# Patient Record
Sex: Male | Born: 1995
Health system: Southern US, Community
[De-identification: ages and names within clinical notes are randomized; demographics above are authoritative.]

## PROBLEM LIST (undated history)

## (undated) ENCOUNTER — Emergency Department (HOSPITAL_COMMUNITY): Admission: EM | Payer: Self-pay | Source: Home / Self Care

## (undated) DIAGNOSIS — E538 Deficiency of other specified B group vitamins: Secondary | ICD-10-CM

## (undated) DIAGNOSIS — F319 Bipolar disorder, unspecified: Secondary | ICD-10-CM

---
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
## 2014-03-30 ENCOUNTER — Emergency Department (HOSPITAL_COMMUNITY)
Admission: EM | Admit: 2014-03-30 | Discharge: 2014-03-30 | Disposition: A | Discharge status: Home / Self Care | Payer: Self-pay | Attending: Emergency Medicine | Admitting: Emergency Medicine

## 2014-03-30 ENCOUNTER — Encounter (HOSPITAL_COMMUNITY): Payer: Self-pay | Admitting: Emergency Medicine

## 2014-03-30 DIAGNOSIS — Z720 Tobacco use: Secondary | ICD-10-CM | POA: Insufficient documentation

## 2014-03-30 DIAGNOSIS — N509 Disorder of male genital organs, unspecified: Secondary | ICD-10-CM | POA: Insufficient documentation

## 2014-03-30 DIAGNOSIS — N5089 Other specified disorders of the male genital organs: Secondary | ICD-10-CM

## 2014-03-30 NOTE — ED Notes (Signed)
Pt sts sores in genital area x 1 week

## 2014-03-30 NOTE — ED Provider Notes (Signed)
CSN: [id number]     Arrival date & time 03/30/14  1636 History  This chart was scribed for non-physician practitioner, Trixie DredgeEmily Kimoni Pickerill, PA-C, working with Loren Raceravid Yelverton, MD, by Bronson CurbJacqueline Melvin, ED Scribe. This patient was seen in room TR10C/TR10C and the patient's care was started at 5:52 PM.    Chief Complaint  Patient presents with  . Exposure to STD    The history is provided by the patient. No language interpreter was used.     HPI Comments: Jonathan [name] is a 18 y.o. male, with no significant medical history, who presents to the Emergency Department complaining of a possible STD. Patient noticed an itching and draining sore in the genital area for the past week. Patient is sexually active and states that he has had unprotected sex a "couple of times". There is associated testicular pain that began 3-4 days ago but has since resolved. He denies penile discharge, penile pain, abdominal pain, testicular swelling, or urinary symptoms.    History reviewed. No pertinent past medical history. History reviewed. No pertinent past surgical history. History reviewed. No pertinent family history. History  Substance Use Topics  . Smoking status: Current Every Day Smoker  . Smokeless tobacco: Not on file  . Alcohol Use: Yes    Review of Systems  Constitutional: Negative for fever.  Gastrointestinal: Negative for abdominal pain.  Genitourinary: Positive for genital sores and testicular pain. Negative for dysuria, urgency, discharge, scrotal swelling and penile pain.  All other systems reviewed and are negative.     Allergies  Review of patient's allergies indicates no known allergies.  Home Medications   Prior to Admission medications   Not on File   Triage Vitals: BP 105/66 mmHg  Pulse 54  Temp(Src) 98.4 F (36.9 C) (Oral)  Resp 18  SpO2 98%  Physical Exam  Constitutional: He appears well-developed and well-nourished. No distress.  HENT:  Head: Normocephalic and  atraumatic.  Neck: Neck supple.  Pulmonary/Chest: Effort normal.  Abdominal: Soft. There is no tenderness.  Genitourinary: Right testis shows no mass, no swelling and no tenderness. Left testis shows no mass, no swelling and no tenderness. Circumcised. No penile erythema or penile tenderness. No discharge found.  A single ulcerated lesion on the ventral aspect of penis with small amount of clear to cloudy discharge. No induration or fluctuance.  Testicles nontender, no masses or swelling bilaterally.  Neurological: He is alert.  Skin: He is not diaphoretic.  Nursing note and vitals reviewed.   ED Course  Procedures (including critical care time)  DIAGNOSTIC STUDIES: Oxygen Saturation is 98% on room air, normal by my interpretation.    COORDINATION OF CARE: At 1759 Discussed treatment plan with patient which includes genital exam (genital exam performed by chaperone: Bronson CurbJacqueline Melvin) and STD screening including herpes simplex virus culture, GC/Chlamydia probe amp. Patient agrees.   Labs Review Labs Reviewed  GC/CHLAMYDIA PROBE AMP  HERPES SIMPLEX VIRUS CULTURE  HIV ANTIBODY (ROUTINE TESTING)  RPR    Imaging Review No results found.   EKG Interpretation None      MDM   Final diagnoses:  Genital lesion, male    Afebrile, nontoxic patient with penile lesion.  Unclear if this is a small pustule or perhaps and ulcer related to herpes.  Swabbed lesion for herpes testing and pt tested for full HIV/STD panel.   D/C home with referral to health department.  Discussed result, findings, treatment, and follow up  with patient.  Pt given return precautions.  Pt verbalizes  understanding and agrees with plan.       I personally performed the services described in this documentation, which was scribed in my presence. The recorded information has been reviewed and is accurate.     Trixie Dredgemily Yves Fodor, PA-C 03/30/14 1834  Loren Raceravid Yelverton, MD 03/30/14 [id number]

## 2014-03-30 NOTE — Discharge Instructions (Signed)
Read the information below.  You may return to the Emergency Department at any time for worsening condition or any new symptoms that concern you. °

## 2014-03-31 LAB — GC/CHLAMYDIA PROBE AMP
CT PROBE, AMP APTIMA: NEGATIVE
GC Probe RNA: NEGATIVE

## 2014-03-31 LAB — HIV ANTIBODY (ROUTINE TESTING W REFLEX): HIV: NONREACTIVE

## 2014-03-31 LAB — RPR

## 2014-04-01 LAB — HERPES SIMPLEX VIRUS CULTURE
CULTURE: NOT DETECTED
SPECIAL REQUESTS: NORMAL

## 2014-04-11 ENCOUNTER — Emergency Department (HOSPITAL_COMMUNITY): Payer: Self-pay

## 2014-04-11 ENCOUNTER — Encounter (HOSPITAL_COMMUNITY): Payer: Self-pay | Admitting: Emergency Medicine

## 2014-04-11 ENCOUNTER — Emergency Department (HOSPITAL_COMMUNITY)
Admission: EM | Admit: 2014-04-11 | Discharge: 2014-04-11 | Disposition: A | Discharge status: Home / Self Care | Payer: Self-pay | Attending: Emergency Medicine | Admitting: Emergency Medicine

## 2014-04-11 DIAGNOSIS — J029 Acute pharyngitis, unspecified: Secondary | ICD-10-CM | POA: Insufficient documentation

## 2014-04-11 DIAGNOSIS — R509 Fever, unspecified: Secondary | ICD-10-CM

## 2014-04-11 DIAGNOSIS — Z720 Tobacco use: Secondary | ICD-10-CM | POA: Insufficient documentation

## 2014-04-11 DIAGNOSIS — R197 Diarrhea, unspecified: Secondary | ICD-10-CM | POA: Insufficient documentation

## 2014-04-11 LAB — RAPID STREP SCREEN (MED CTR MEBANE ONLY): Streptococcus, Group A Screen (Direct): NEGATIVE

## 2014-04-11 LAB — I-STAT CHEM 8, ED
BUN: 12 mg/dL (ref 6–23)
CREATININE: 1.2 mg/dL (ref 0.50–1.35)
Calcium, Ion: 1.12 mmol/L (ref 1.12–1.23)
Chloride: 101 mEq/L (ref 96–112)
GLUCOSE: 84 mg/dL (ref 70–99)
HEMATOCRIT: 49 % (ref 39.0–52.0)
Hemoglobin: 16.7 g/dL (ref 13.0–17.0)
POTASSIUM: 3.8 mmol/L (ref 3.5–5.1)
Sodium: 137 mmol/L (ref 135–145)
TCO2: 20 mmol/L (ref 0–100)

## 2014-04-11 LAB — CBC WITH DIFFERENTIAL/PLATELET
Basophils Absolute: 0 10*3/uL (ref 0.0–0.1)
Basophils Relative: 0 % (ref 0–1)
EOS ABS: 0 10*3/uL (ref 0.0–0.7)
Eosinophils Relative: 0 % (ref 0–5)
HEMATOCRIT: 44.3 % (ref 39.0–52.0)
HEMOGLOBIN: 15.5 g/dL (ref 13.0–17.0)
LYMPHS ABS: 0.9 10*3/uL (ref 0.7–4.0)
LYMPHS PCT: 4 % — AB (ref 12–46)
MCH: 30.9 pg (ref 26.0–34.0)
MCHC: 35 g/dL (ref 30.0–36.0)
MCV: 88.4 fL (ref 78.0–100.0)
MONO ABS: 2.2 10*3/uL — AB (ref 0.1–1.0)
Monocytes Relative: 9 % (ref 3–12)
NEUTROS ABS: 20 10*3/uL — AB (ref 1.7–7.7)
NEUTROS PCT: 87 % — AB (ref 43–77)
PLATELETS: 217 10*3/uL (ref 150–400)
RBC: 5.01 MIL/uL (ref 4.22–5.81)
RDW: 13 % (ref 11.5–15.5)
WBC: 23.1 10*3/uL — AB (ref 4.0–10.5)

## 2014-04-11 LAB — URINALYSIS, ROUTINE W REFLEX MICROSCOPIC
BILIRUBIN URINE: NEGATIVE
GLUCOSE, UA: NEGATIVE mg/dL
Hgb urine dipstick: NEGATIVE
KETONES UR: 15 mg/dL — AB
Leukocytes, UA: NEGATIVE
NITRITE: NEGATIVE
PH: 6.5 (ref 5.0–8.0)
Protein, ur: NEGATIVE mg/dL
Specific Gravity, Urine: 1.018 (ref 1.005–1.030)
Urobilinogen, UA: 1 mg/dL (ref 0.0–1.0)

## 2014-04-11 LAB — LACTIC ACID, PLASMA: Lactic Acid, Venous: 0.9 mmol/L (ref 0.5–2.2)

## 2014-04-11 MED ORDER — ACETAMINOPHEN 500 MG PO TABS
1000.0000 mg | ORAL_TABLET | Freq: Once | ORAL | Status: AC
  Administered 2014-04-11: 1000 mg via ORAL
  Filled 2014-04-11: qty 2

## 2014-04-11 MED ORDER — IBUPROFEN 800 MG PO TABS
800.0000 mg | ORAL_TABLET | Freq: Once | ORAL | Status: AC
  Administered 2014-04-11: 800 mg via ORAL
  Filled 2014-04-11: qty 1

## 2014-04-11 NOTE — ED Notes (Signed)
Pt. Stated, I started having hurting all over yesterday.

## 2014-04-11 NOTE — Discharge Instructions (Signed)
Tylenol and motrin on regular basis for fever and body aches. Try salt water gargles for sore throat. Rest. Drink plenty of fluids. If not improving, follow up with your doctor. If worsening, return to ER.    Fever, Adult A fever is a higher than normal body temperature. In an adult, an oral temperature around 98.6 F (37 C) is considered normal. A temperature of 100.4 F (38 C) or higher is generally considered a fever. Mild or moderate fevers generally have no long-term effects and often do not require treatment. Extreme fever (greater than or equal to 106 F or 41.1 C) can cause seizures. The sweating that may occur with repeated or prolonged fever may cause dehydration. Elderly people can develop confusion during a fever. A measured temperature can vary with:  Age.  Time of day.  Method of measurement (mouth, underarm, rectal, or ear). The fever is confirmed by taking a temperature with a thermometer. Temperatures can be taken different ways. Some methods are accurate and some are not.  An oral temperature is used most commonly. Electronic thermometers are fast and accurate.  An ear temperature will only be accurate if the thermometer is positioned as recommended by the manufacturer.  A rectal temperature is accurate and done for those adults who have a condition where an oral temperature cannot be taken.  An underarm (axillary) temperature is not accurate and not recommended. Fever is a symptom, not a disease.  CAUSES   Infections commonly cause fever.  Some noninfectious causes for fever include:  Some arthritis conditions.  Some thyroid or adrenal gland conditions.  Some immune system conditions.  Some types of cancer.  A medicine reaction.  High doses of certain street drugs such as methamphetamine.  Dehydration.  Exposure to high outside or room temperatures.  Occasionally, the source of a fever cannot be determined. This is sometimes called a "fever of unknown  origin" (FUO).  Some situations may lead to a temporary rise in body temperature that may go away on its own. Examples are:  Childbirth.  Surgery.  Intense exercise. HOME CARE INSTRUCTIONS   Take appropriate medicines for fever. Follow dosing instructions carefully. If you use acetaminophen to reduce the fever, be careful to avoid taking other medicines that also contain acetaminophen. Do not take aspirin for a fever if you are younger than age 94. There is an association with Reye's syndrome. Reye's syndrome is a rare but potentially deadly disease.  If an infection is present and antibiotics have been prescribed, take them as directed. Finish them even if you start to feel better.  Rest as needed.  Maintain an adequate fluid intake. To prevent dehydration during an illness with prolonged or recurrent fever, you may need to drink extra fluid.Drink enough fluids to keep your urine clear or pale yellow.  Sponging or bathing with room temperature water may help reduce body temperature. Do not use ice water or alcohol sponge baths.  Dress comfortably, but do not over-bundle. SEEK MEDICAL CARE IF:   You are unable to keep fluids down.  You develop vomiting or diarrhea.  You are not feeling at least partly better after 3 days.  You develop new symptoms or problems. SEEK IMMEDIATE MEDICAL CARE IF:   You have shortness of breath or trouble breathing.  You develop excessive weakness.  You are dizzy or you faint.  You are extremely thirsty or you are making little or no urine.  You develop new pain that was not there before (such as  in the head, neck, chest, back, or abdomen).  You have persistent vomiting and diarrhea for more than 1 to 2 days.  You develop a stiff neck or your eyes become sensitive to light.  You develop a skin rash.  You have a fever or persistent symptoms for more than 2 to 3 days.  You have a fever and your symptoms suddenly get worse. MAKE SURE YOU:     Understand these instructions.  Will watch your condition.  Will get help right away if you are not doing well or get worse. Document Released: 09/12/2000 Document Revised: 08/03/2013 Document Reviewed: 01/18/2011 [facility] Patient Information 2015 [employer] [place]. This information is not intended to replace advice given to you by your health care provider. Make sure you discuss any questions you have with your health care provider. Upper Respiratory Infection, Adult An upper respiratory infection (URI) is also sometimes known as the common cold. The upper respiratory tract includes the nose, sinuses, throat, trachea, and bronchi. Bronchi are the airways leading to the lungs. Most people improve within 1 week, but symptoms can last up to 2 weeks. A residual cough may last even longer.  CAUSES Many different viruses can infect the tissues lining the upper respiratory tract. The tissues become irritated and inflamed and often become very moist. Mucus production is also common. A cold is contagious. You can easily spread the virus to others by oral contact. This includes kissing, sharing a glass, coughing, or sneezing. Touching your mouth or nose and then touching a surface, which is then touched by another person, can also spread the virus. SYMPTOMS  Symptoms typically develop 1 to 3 days after you come in contact with a cold virus. Symptoms vary from person to person. They may include:  Runny nose.  Sneezing.  Nasal congestion.  Sinus irritation.  Sore throat.  Loss of voice (laryngitis).  Cough.  Fatigue.  Muscle aches.  Loss of appetite.  Headache.  Low-grade fever. DIAGNOSIS  You might diagnose your own cold based on familiar symptoms, since most people get a cold 2 to 3 times a year. Your caregiver can confirm this based on your exam. Most importantly, your caregiver can check that your symptoms are not due to another disease such as strep throat, sinusitis, pneumonia,  asthma, or epiglottitis. Blood tests, throat tests, and X-rays are not necessary to diagnose a common cold, but they may sometimes be helpful in excluding other more serious diseases. Your caregiver will decide if any further tests are required. RISKS AND COMPLICATIONS  You may be at risk for a more severe case of the common cold if you smoke cigarettes, have chronic heart disease (such as heart failure) or lung disease (such as asthma), or if you have a weakened immune system. The very young and very old are also at risk for more serious infections. Bacterial sinusitis, middle ear infections, and bacterial pneumonia can complicate the common cold. The common cold can worsen asthma and chronic obstructive pulmonary disease (COPD). Sometimes, these complications can require emergency medical care and may be life-threatening. PREVENTION  The best way to protect against getting a cold is to practice good hygiene. Avoid oral or hand contact with people with cold symptoms. Wash your hands often if contact occurs. There is no clear evidence that vitamin C, vitamin E, echinacea, or exercise reduces the chance of developing a cold. However, it is always recommended to get plenty of rest and practice good nutrition. TREATMENT  Treatment is directed at relieving  symptoms. There is no cure. Antibiotics are not effective, because the infection is caused by a virus, not by bacteria. Treatment may include:  Increased fluid intake. Sports drinks offer valuable electrolytes, sugars, and fluids.  Breathing heated mist or steam (vaporizer or shower).  Eating chicken soup or other clear broths, and maintaining good nutrition.  Getting plenty of rest.  Using gargles or lozenges for comfort.  Controlling fevers with ibuprofen or acetaminophen as directed by your caregiver.  Increasing usage of your inhaler if you have asthma. Zinc gel and zinc lozenges, taken in the first 24 hours of the common cold, can shorten the  duration and lessen the severity of symptoms. Pain medicines may help with fever, muscle aches, and throat pain. A variety of non-prescription medicines are available to treat congestion and runny nose. Your caregiver can make recommendations and may suggest nasal or lung inhalers for other symptoms.  HOME CARE INSTRUCTIONS   Only take over-the-counter or prescription medicines for pain, discomfort, or fever as directed by your caregiver.  Use a warm mist humidifier or inhale steam from a shower to increase air moisture. This may keep secretions moist and make it easier to breathe.  Drink enough water and fluids to keep your urine clear or pale yellow.  Rest as needed.  Return to work when your temperature has returned to normal or as your caregiver advises. You may need to stay home longer to avoid infecting others. You can also use a face mask and careful hand washing to prevent spread of the virus. SEEK MEDICAL CARE IF:   After the first few days, you feel you are getting worse rather than better.  You need your caregiver's advice about medicines to control symptoms.  You develop chills, worsening shortness of breath, or brown or red sputum. These may be signs of pneumonia.  You develop yellow or brown nasal discharge or pain in the face, especially when you bend forward. These may be signs of sinusitis.  You develop a fever, swollen neck glands, pain with swallowing, or white areas in the back of your throat. These may be signs of strep throat. SEEK IMMEDIATE MEDICAL CARE IF:   You have a fever.  You develop severe or persistent headache, ear pain, sinus pain, or chest pain.  You develop wheezing, a prolonged cough, cough up blood, or have a change in your usual mucus (if you have chronic lung disease).  You develop sore muscles or a stiff neck. Document Released: 09/12/2000 Document Revised: 06/11/2011 Document Reviewed: 06/24/2013 [facility] Patient Information 2015 [place],  [place]. This information is not intended to replace advice given to you by your health care provider. Make sure you discuss any questions you have with your health care provider.

## 2014-04-11 NOTE — ED Provider Notes (Signed)
CSN: [id number]     Arrival date & time 04/11/14  [id number] History   First MD Initiated Contact with Patient 04/11/14 1104     Chief Complaint  Patient presents with  . Generalized Body Aches     (Consider location/radiation/quality/duration/timing/severity/associated sxs/prior Treatment) HPI Jonathan [name] is a 19 y.o. male who presents to emergency department complaining of generalized body aches, chills, sore throat, headache, diarrhea. States he has not had any appetite and lost few days. Symptoms began yesterday. He has not taken any medications for his symptoms at home. He denies any recent ill contacts to me, however she reported to another person that his girlfriend had strep 3 weeks ago. Pt has not had his flu shot this year. He denies photophobia. No neck pain or stiffness. No n/v. Nothing making his symptoms better or worse  History reviewed. No pertinent past medical history. History reviewed. No pertinent past surgical history. No family history on file. History  Substance Use Topics  . Smoking status: Current Every Day Smoker  . Smokeless tobacco: Not on file  . Alcohol Use: Yes    Review of Systems  Constitutional: Positive for chills. Negative for fever.  HENT: Positive for sore throat. Negative for congestion, sinus pressure and sneezing.   Eyes: Negative for photophobia.  Respiratory: Negative for cough, chest tightness and shortness of breath.   Cardiovascular: Negative for chest pain, palpitations and leg swelling.  Gastrointestinal: Positive for diarrhea. Negative for nausea, vomiting, abdominal pain and abdominal distention.  Musculoskeletal: Positive for myalgias and arthralgias. Negative for neck pain and neck stiffness.  Skin: Negative for rash.  Allergic/Immunologic: Negative for immunocompromised state.  Neurological: Positive for headaches. Negative for dizziness, weakness, light-headedness and numbness.      Allergies  Review of patient's allergies  indicates no known allergies.  Home Medications   Prior to Admission medications   Not on File   BP 123/109 mmHg  Pulse 97  Temp(Src) 101.6 F (38.7 C) (Oral)  Resp 6  Ht 5\' 7"  (1.702 m)  Wt 130 lb (58.968 kg)  BMI 20.36 kg/m2  SpO2 98% Physical Exam  Constitutional: He appears well-developed and well-nourished. No distress.  HENT:  Head: Normocephalic and atraumatic.  Right Ear: External ear normal.  Left Ear: External ear normal.  Nose: Nose normal.  Tonsils enlarged, erythematous, no exudate  Eyes: Conjunctivae are normal.  Neck: Neck supple.  Cardiovascular: Normal rate, regular rhythm and normal heart sounds.   Pulmonary/Chest: Effort normal. No respiratory distress. He has no wheezes. He has no rales.  Abdominal: Soft. Bowel sounds are normal. He exhibits no distension. There is no tenderness. There is no rebound.  Musculoskeletal: He exhibits no edema.  Neurological: He is alert.  Skin: Skin is warm and dry.  Nursing note and vitals reviewed.   ED Course  Procedures (including critical care time) Labs Review Labs Reviewed  CBC WITH DIFFERENTIAL - Abnormal; Notable for the following:    WBC 23.1 (*)    Neutrophils Relative % 87 (*)    Neutro Abs 20.0 (*)    Lymphocytes Relative 4 (*)    Monocytes Absolute 2.2 (*)    All other components within normal limits  RAPID STREP SCREEN  CULTURE, GROUP A STREP  LACTIC ACID, PLASMA  URINALYSIS, ROUTINE W REFLEX MICROSCOPIC  I-STAT CHEM 8, ED    Imaging Review Dg Chest 2 View  04/11/2014   CLINICAL DATA:  One day history of cough and fever  EXAM: CHEST  2 VIEW  COMPARISON:  None.  FINDINGS: Lungs are clear. The heart size and pulmonary vascularity are normal. No adenopathy. No bone lesions.  IMPRESSION: No edema or consolidation.   Electronically Signed   By: Bretta Bang M.D.   On: 04/11/2014 15:09     EKG Interpretation None      MDM   Final diagnoses:  Fever  Pharyngitis    Pt with flu like  symptoms, sore throat, temp 102.1. Will get labs, strep screen, tylenol ordered for temp.   3:40 PM Temperature down to 99.4. Patient is feeling much better after Tylenol. He states his generalized body aches improved. His labs show elevated white count of 23, otherwise normal labs. Lactic acid was obtained and is negative. Chest x-ray negative. Patient denies any urinary symptoms. He is not having any vomiting, bowel pain, diarrhea. No back pain. No neck pain or stiffness. No photophobia. Doubt meningismus. Given nontoxic appearance, no tachycardia, negative lactic acid, and doubt sepsis. Was likely viral upper respiratory tract infection. Possibly mono, however did not test. Strep culture sent. Will discharge home symptomatic treatment. Explained to patient his white blood count is high, if he is developing new symptoms that are concerning, he is to return to emergency department. Otherwise follow-up with her primary care doctor for recheck in 2-3 days. Patient voiced understanding.  Filed Vitals:   04/11/14 1303 04/11/14 1304 04/11/14 1400 04/11/14 1512  BP:  98/50 103/52   Pulse: 97 96 89   Temp:  102.1 F (38.9 C)  99.4 F (37.4 C)  TempSrc:  Oral  Oral  Resp:  18    Height:      Weight:      SpO2: 100% 100% 100%       Lottie Mussel, PA-C 04/11/14 1626  Suzi Roots, MD 04/13/14 1137

## 2014-04-11 NOTE — ED Notes (Signed)
Patient transported to X-ray 

## 2014-04-13 LAB — CULTURE, GROUP A STREP

## 2014-06-21 ENCOUNTER — Encounter (HOSPITAL_COMMUNITY): Payer: Self-pay | Admitting: *Deleted

## 2014-06-21 ENCOUNTER — Emergency Department (HOSPITAL_COMMUNITY): Payer: Self-pay

## 2014-06-21 ENCOUNTER — Emergency Department (HOSPITAL_COMMUNITY)
Admission: EM | Admit: 2014-06-21 | Discharge: 2014-06-21 | Disposition: A | Discharge status: Home / Self Care | Payer: Self-pay | Attending: Emergency Medicine | Admitting: Emergency Medicine

## 2014-06-21 DIAGNOSIS — M25562 Pain in left knee: Secondary | ICD-10-CM

## 2014-06-21 DIAGNOSIS — X58XXXA Exposure to other specified factors, initial encounter: Secondary | ICD-10-CM | POA: Insufficient documentation

## 2014-06-21 DIAGNOSIS — Y9389 Activity, other specified: Secondary | ICD-10-CM | POA: Insufficient documentation

## 2014-06-21 DIAGNOSIS — Z720 Tobacco use: Secondary | ICD-10-CM | POA: Insufficient documentation

## 2014-06-21 DIAGNOSIS — Y929 Unspecified place or not applicable: Secondary | ICD-10-CM | POA: Insufficient documentation

## 2014-06-21 DIAGNOSIS — S8992XA Unspecified injury of left lower leg, initial encounter: Secondary | ICD-10-CM | POA: Insufficient documentation

## 2014-06-21 DIAGNOSIS — M25462 Effusion, left knee: Secondary | ICD-10-CM

## 2014-06-21 DIAGNOSIS — Y998 Other external cause status: Secondary | ICD-10-CM | POA: Insufficient documentation

## 2014-06-21 MED ORDER — IBUPROFEN 400 MG PO TABS
600.0000 mg | ORAL_TABLET | Freq: Once | ORAL | Status: AC
  Administered 2014-06-21: 600 mg via ORAL
  Filled 2014-06-21: qty 2

## 2014-06-21 MED ORDER — HYDROCODONE-ACETAMINOPHEN 5-325 MG PO TABS: 1.0000 | ORAL_TABLET | ORAL | Status: DC | PRN

## 2014-06-21 MED ORDER — MELOXICAM 7.5 MG PO TABS: 7.5000 mg | ORAL_TABLET | Freq: Every day | ORAL | Status: DC

## 2014-06-21 MED ORDER — HYDROCODONE-ACETAMINOPHEN 5-325 MG PO TABS
2.0000 | ORAL_TABLET | Freq: Once | ORAL | Status: AC
  Administered 2014-06-21: 2 via ORAL
  Filled 2014-06-21: qty 2

## 2014-06-21 NOTE — ED Notes (Signed)
PA at BS.  

## 2014-06-21 NOTE — ED Notes (Signed)
Pt in stating he heard a pop in his left knee and felt like it went "out of place" and then returned, pt c/o pain with movement now, normal movement noted with no obvious deformities, history of dislocation in the past
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
## 2014-08-25 ENCOUNTER — Emergency Department (HOSPITAL_COMMUNITY)
Admission: EM | Admit: 2014-08-25 | Discharge: 2014-08-25 | Disposition: A | Discharge status: Home / Self Care | Payer: Self-pay | Attending: Emergency Medicine | Admitting: Emergency Medicine

## 2014-08-25 ENCOUNTER — Emergency Department (HOSPITAL_COMMUNITY): Payer: Self-pay

## 2014-08-25 ENCOUNTER — Encounter (HOSPITAL_COMMUNITY): Payer: Self-pay | Admitting: *Deleted

## 2014-08-25 DIAGNOSIS — Y9389 Activity, other specified: Secondary | ICD-10-CM | POA: Insufficient documentation

## 2014-08-25 DIAGNOSIS — Z720 Tobacco use: Secondary | ICD-10-CM | POA: Insufficient documentation

## 2014-08-25 DIAGNOSIS — Y929 Unspecified place or not applicable: Secondary | ICD-10-CM | POA: Insufficient documentation

## 2014-08-25 DIAGNOSIS — S46911A Strain of unspecified muscle, fascia and tendon at shoulder and upper arm level, right arm, initial encounter: Secondary | ICD-10-CM | POA: Insufficient documentation

## 2014-08-25 DIAGNOSIS — X58XXXA Exposure to other specified factors, initial encounter: Secondary | ICD-10-CM | POA: Insufficient documentation

## 2014-08-25 DIAGNOSIS — Y998 Other external cause status: Secondary | ICD-10-CM | POA: Insufficient documentation

## 2014-08-25 MED ORDER — NAPROXEN 250 MG PO TABS
500.0000 mg | ORAL_TABLET | Freq: Once | ORAL | Status: AC
  Administered 2014-08-25: 500 mg via ORAL
  Filled 2014-08-25: qty 2

## 2014-08-25 MED ORDER — DIAZEPAM 5 MG PO TABS
5.0000 mg | ORAL_TABLET | Freq: Once | ORAL | Status: AC
  Administered 2014-08-25: 5 mg via ORAL
  Filled 2014-08-25: qty 1

## 2014-08-25 MED ORDER — NAPROXEN 500 MG PO TABS: 500.0000 mg | ORAL_TABLET | Freq: Two times a day (BID) | ORAL | Status: DC

## 2014-08-25 MED ORDER — DIAZEPAM 5 MG PO TABS: 5.0000 mg | ORAL_TABLET | Freq: Two times a day (BID) | ORAL | Status: DC
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
## 2014-08-25 NOTE — Discharge Instructions (Signed)
Recommend that you apply ice 3-4 times per day. Take Naproxen and Valium as prescribed for symptoms. Stretch your shoulder at least once per hour to prevent from developing "frozen shoulder" which will worsen your pain. Follow up with your primary care doctor for recheck and an orthopedist, as needed, if symptoms persist.  Shoulder Sprain A shoulder sprain is the result of damage to the tough, fiber-like tissues (ligaments) that help hold your shoulder in place. The ligaments may be stretched or torn. Besides the main shoulder joint (the ball and socket), there are several smaller joints that connect the bones in this area. A sprain usually involves one of those joints. Most often it is the acromioclavicular (or AC) joint. That is the joint that connects the collarbone (clavicle) and the shoulder blade (scapula) at the top point of the shoulder blade (acromion). A shoulder sprain is a mild form of what is called a shoulder separation. Recovering from a shoulder sprain may take some time. For some, pain lingers for several months. Most people recover without long term problems. CAUSES   A shoulder sprain is usually caused by some kind of trauma. This might be:  Falling on an outstretched arm.  Being hit hard on the shoulder.  Twisting the arm.  Shoulder sprains are more likely to occur in people who:  Play sports.  Have balance or coordination problems. SYMPTOMS   Pain when you move your shoulder.  Limited ability to move the shoulder.  Swelling and tenderness on top of the shoulder.  Redness or warmth in the shoulder.  Bruising.  A change in the shape of the shoulder. DIAGNOSIS  Your healthcare provider may:  Ask about your symptoms.  Ask about recent activity that might have caused those symptoms.  Examine your shoulder. You may be asked to do simple exercises to test movement. The other shoulder will be examined for comparison.  Order some tests that provide a look inside  the body. They can show the extent of the injury. The tests could include:  X-rays.  CT (computed tomography) scan.  MRI (magnetic resonance imaging) scan. RISKS AND COMPLICATIONS  Loss of full shoulder motion.  Ongoing shoulder pain. TREATMENT  How long it takes to recover from a shoulder sprain depends on how severe it was. Treatment options may include:  Rest. You should not use the arm or shoulder until it heals.  Ice. For 2 or 3 days after the injury, put an ice pack on the shoulder up to 4 times a day. It should stay on for 15 to 20 minutes each time. Wrap the ice in a towel so it does not touch your skin.  Over-the-counter medicine to relieve pain.  A sling or brace. This will keep the arm still while the shoulder is healing.  Physical therapy or rehabilitation exercises. These will help you regain strength and motion. Ask your healthcare provider when it is OK to begin these exercises.  Surgery. The need for surgery is rare with a sprained shoulder, but some people may need surgery to keep the joint in place and reduce pain. HOME CARE INSTRUCTIONS   Ask your healthcare provider about what you should and should not do while your shoulder heals.  Make sure you know how to apply ice to the correct area of your shoulder.  Talk with your healthcare provider about which medications should be used for pain and swelling.  If rehabilitation therapy will be needed, ask your healthcare provider to refer you to a  therapist. If it is not recommended, then ask about at-home exercises. Find out when exercise should begin. SEEK MEDICAL CARE IF:  Your pain, swelling, or redness at the joint increases. SEEK IMMEDIATE MEDICAL CARE IF:   You have a fever.  You cannot move your arm or shoulder. Document Released: 08/05/2008 Document Revised: 06/11/2011 Document Reviewed: 08/05/2008 [facility] Patient Information 2015 [employer]. This information is not intended to replace advice  given to you by your health care provider. Make sure you discuss any questions you have with your health care provider.
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
## 2014-11-23 ENCOUNTER — Encounter (HOSPITAL_COMMUNITY): Payer: Self-pay

## 2014-11-23 ENCOUNTER — Emergency Department (HOSPITAL_COMMUNITY)
Admission: EM | Admit: 2014-11-23 | Discharge: 2014-11-23 | Disposition: A | Discharge status: Home / Self Care | Attending: Emergency Medicine | Admitting: Emergency Medicine

## 2014-11-23 DIAGNOSIS — S61411A Laceration without foreign body of right hand, initial encounter: Secondary | ICD-10-CM | POA: Insufficient documentation

## 2014-11-23 DIAGNOSIS — Y9289 Other specified places as the place of occurrence of the external cause: Secondary | ICD-10-CM | POA: Insufficient documentation

## 2014-11-23 DIAGNOSIS — Z79899 Other long term (current) drug therapy: Secondary | ICD-10-CM | POA: Insufficient documentation

## 2014-11-23 DIAGNOSIS — Z23 Encounter for immunization: Secondary | ICD-10-CM | POA: Insufficient documentation

## 2014-11-23 DIAGNOSIS — X58XXXA Exposure to other specified factors, initial encounter: Secondary | ICD-10-CM | POA: Insufficient documentation

## 2014-11-23 DIAGNOSIS — Z791 Long term (current) use of non-steroidal anti-inflammatories (NSAID): Secondary | ICD-10-CM | POA: Insufficient documentation

## 2014-11-23 DIAGNOSIS — Z720 Tobacco use: Secondary | ICD-10-CM | POA: Insufficient documentation

## 2014-11-23 DIAGNOSIS — Y9389 Activity, other specified: Secondary | ICD-10-CM | POA: Insufficient documentation

## 2014-11-23 DIAGNOSIS — Y998 Other external cause status: Secondary | ICD-10-CM | POA: Insufficient documentation

## 2014-11-23 DIAGNOSIS — S21111A Laceration without foreign body of right front wall of thorax without penetration into thoracic cavity, initial encounter: Secondary | ICD-10-CM | POA: Insufficient documentation

## 2014-11-23 DIAGNOSIS — IMO0002 Reserved for concepts with insufficient information to code with codable children: Secondary | ICD-10-CM

## 2014-11-23 LAB — COMPREHENSIVE METABOLIC PANEL
ALBUMIN: 4.7 g/dL (ref 3.5–5.0)
ALK PHOS: 46 U/L (ref 38–126)
ALT: 36 U/L (ref 17–63)
AST: 27 U/L (ref 15–41)
Anion gap: 7 (ref 5–15)
BILIRUBIN TOTAL: 0.4 mg/dL (ref 0.3–1.2)
BUN: 13 mg/dL (ref 6–20)
CALCIUM: 9.5 mg/dL (ref 8.9–10.3)
CO2: 26 mmol/L (ref 22–32)
CREATININE: 1.23 mg/dL (ref 0.61–1.24)
Chloride: 105 mmol/L (ref 101–111)
GFR calc Af Amer: >60 mL/min (ref >60)
GFR calc non Af Amer: >60 mL/min (ref >60)
GLUCOSE: 96 mg/dL (ref 65–99)
Potassium: 3.5 mmol/L (ref 3.5–5.1)
SODIUM: 138 mmol/L (ref 135–145)
Total Protein: 7.9 g/dL (ref 6.5–8.1)

## 2014-11-23 LAB — CBC
HEMATOCRIT: 43 % (ref 39.0–52.0)
HEMOGLOBIN: 15.1 g/dL (ref 13.0–17.0)
MCH: 31.5 pg (ref 26.0–34.0)
MCHC: 35.1 g/dL (ref 30.0–36.0)
MCV: 89.6 fL (ref 78.0–100.0)
Platelets: 233 10*3/uL (ref 150–400)
RBC: 4.8 MIL/uL (ref 4.22–5.81)
RDW: 12.6 % (ref 11.5–15.5)
WBC: 11.3 10*3/uL — AB (ref 4.0–10.5)

## 2014-11-23 LAB — ETHANOL: Alcohol, Ethyl (B): <5 mg/dL (ref <5)

## 2014-11-23 LAB — RAPID URINE DRUG SCREEN, HOSP PERFORMED
Amphetamines: NOT DETECTED
BARBITURATES: NOT DETECTED
BENZODIAZEPINES: NOT DETECTED
Cocaine: NOT DETECTED
Opiates: NOT DETECTED
Tetrahydrocannabinol: POSITIVE — AB

## 2014-11-23 MED ORDER — TETANUS-DIPHTH-ACELL PERTUSSIS 5-2.5-18.5 LF-MCG/0.5 IM SUSP
0.5000 mL | Freq: Once | INTRAMUSCULAR | Status: AC
  Administered 2014-11-23: 0.5 mL via INTRAMUSCULAR
  Filled 2014-11-23: qty 0.5

## 2014-11-23 NOTE — BH Assessment (Signed)
Assessment completed. Consulted Dr. Dub Mikes who agrees that pt does not meet inpatient criteria and should follow up with [facility]. Dr. Nicanor Alcon has been informed of the recommendation.

## 2014-11-23 NOTE — ED Notes (Signed)
TTS at bedside. 

## 2014-11-23 NOTE — ED Notes (Addendum)
Verbalized understanding discharge instructions and s/s of infection. In no acute distress.  Pt given a bus pass.

## 2014-11-23 NOTE — ED Notes (Signed)
Pt has in belonging bag:  [employer], black wallet ([place] ID card, social security card), white charger, blue and red socks, black shorts, two pair of silver colored earrings
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
## 2014-11-23 NOTE — BH Assessment (Addendum)
Tele Assessment Note   Jonathan [name] is an 19 y.o. male. Presenting to [facility] unaccompanied after cutting is hand and chest. PT stated "I cut myself because of stress". "I am dealing with stuff at home and relationship problems". Pt reported  that he cut himself with a box cutter. Pt denies SI, HI and AVH at this time. Pt did not report any previous suicide attempts or psychiatric hospitalizations but shared that he has engaged in self-injurious behaviors in the past. Pt reported that he began cutting several months ago. PT is currently receiving mental health treatment through [facility] and reported that he is prescribed Seroquel. Pt reported that he is compliant with his medication. Pt is endorsing multiple depressive symptoms and shared that he is dealing with family and relationship stressors. Pt denied alcohol and illicit substance use; UDS is positive for THC. Pt denied having access to weapons or firearms. Pt reported that he is currently on probation and shared that he has to visit his probation officer on Wednesday. Pt denied any history of physical, sexual or emotional abuse. Pt reported that he lives with his girlfriend and his sister is a part of his support system. Pt does not meet inpatient criteria at this time and it is recommended that he follow up with [facility]. PT and Dr. Nicanor Alcon are agreeable to the plan.   Axis I: Depressive Disorder NOS  Past Medical History: History reviewed. No pertinent past medical history.  History reviewed. No pertinent past surgical history.  Family History: History reviewed. No pertinent family history.  Social History:  reports that he has been smoking.  He does not have any smokeless tobacco history on file. He reports that he drinks alcohol. He reports that he does not use illicit drugs.  Additional Social History:  Alcohol / Drug Use History of alcohol / drug use?:  (Pt denies. Positive for THC)  CIWA: CIWA-Ar BP: 118/66 mmHg Pulse Rate: 77 COWS:     PATIENT STRENGTHS: (choose at least two) Average or above average intelligence Capable of independent living  Allergies: No Known Allergies  Home Medications:  (Not in a hospital admission)  OB/GYN Status:  No LMP for male patient.  General Assessment Data [facility] of Assessment: [facility] TTS Assessment: In system Is this a Tele or Face-to-Face Assessment?: Face-to-Face Is this an Initial Assessment or a Re-assessment for this encounter?: Initial Assessment Marital status: Single Living Arrangements: Spouse/significant other Can pt return to current living arrangement?: Yes Admission Status: Voluntary Is patient capable of signing voluntary admission?: Yes Referral Source: Self/Family/Friend Insurance type: Tricare      Crisis Care Plan Living Arrangements: Spouse/significant other Name of Psychiatrist: Vesta Mixer  Name of Therapist: No provider reported at this time.   Education Status Is patient currently in school?: No Current Grade: N/A Highest grade of school patient has completed: 51 Name of school: N/A Contact person: N/A  Risk to self with the past 6 months Suicidal Ideation: No Has patient been a risk to self within the past 6 months prior to admission? : No Suicidal Intent: No Has patient had any suicidal intent within the past 6 months prior to admission? : No Is patient at risk for suicide?: No Suicidal Plan?: No Has patient had any suicidal plan within the past 6 months prior to admission? : No Access to Means: No What has been your use of drugs/alcohol within the last 12 months?: Pt denies. UDS is positive for THC.  Previous Attempts/Gestures: No How many times?: 0 Other  Self Harm Risks: Cutting  Triggers for Past Attempts: None known Intentional Self Injurious Behavior: Cutting Comment - Self Injurious Behavior: Cuts to hand and chest  Family Suicide History: No Recent stressful life event(s): Conflict (Comment) (Relationship stressors  ) Persecutory voices/beliefs?: No Depression: Yes Depression Symptoms: Despondent, Insomnia, Isolating, Fatigue, Guilt, Feeling angry/irritable Substance abuse history and/or treatment for substance abuse?: No  Risk to Others within the past 6 months Homicidal Ideation: No Does patient have any lifetime risk of violence toward others beyond the six months prior to admission? : No Thoughts of Harm to Others: No Current Homicidal Intent: No Current Homicidal Plan: No Access to Homicidal Means: No Identified Victim: N/A History of harm to others?: Yes Assessment of Violence: On admission Violent Behavior Description: No violent behaviors observed. Pt has an assault on government official charge  Does patient have access to weapons?: No Criminal Charges Pending?: No Does patient have a court date: No Is patient on probation?: Yes  Psychosis Hallucinations: None noted Delusions: None noted  Mental Status Report Appearance/Hygiene: In scrubs Eye Contact: Fair Motor Activity: Freedom of movement Speech: Soft, Logical/coherent Level of Consciousness: Quiet/awake Mood: Euthymic Affect: Appropriate to circumstance Anxiety Level: None Thought Processes: Relevant, Coherent Judgement: Unimpaired Orientation: Person, Place, Time, Situation Obsessive Compulsive Thoughts/Behaviors: None  Cognitive Functioning Concentration: Decreased Memory: Remote Intact, Recent Intact IQ: Average Insight: Fair Impulse Control: Poor Appetite: Good Weight Loss: 0 Sleep: No Change Total Hours of Sleep: 8 (With medication ) Vegetative Symptoms: None  ADLScreening [facility]) Patient's cognitive ability adequate to safely complete daily activities?: Yes Patient able to express need for assistance with ADLs?: Yes Independently performs ADLs?: Yes (appropriate for developmental age)  Prior Inpatient Therapy Prior Inpatient Therapy: No  Prior Outpatient Therapy Prior Outpatient  Therapy: Yes Prior Therapy Dates: 2016 Prior Therapy Facilty/Provider(s): [facility]  Reason for Treatment: Depression, anger, insomnia  Does patient have an ACCT team?: No Does patient have Intensive In-House Services?  : No Does patient have [facility] services? : Yes Does patient have P4CC services?: No  ADL Screening (condition at time of admission) Patient's cognitive ability adequate to safely complete daily activities?: Yes Is the patient deaf or have difficulty hearing?: No Does the patient have difficulty seeing, even when wearing glasses/contacts?: No Does the patient have difficulty concentrating, remembering, or making decisions?: No Patient able to express need for assistance with ADLs?: Yes Does the patient have difficulty dressing or bathing?: No Independently performs ADLs?: Yes (appropriate for developmental age)       Abuse/Neglect Assessment (Assessment to be complete while patient is alone) Physical Abuse: Denies Verbal Abuse: Denies Sexual Abuse: Denies Exploitation of patient/patient's resources: Denies Self-Neglect: Denies     Merchant navy officer (For Healthcare) Does patient have an advance directive?: No Would patient like information on creating an advanced directive?: No - patient declined information    Additional Information 1:1 In Past 12 Months?: Yes CIRT Risk: No Elopement Risk: No Does patient have medical clearance?: Yes     Disposition:  Disposition Initial Assessment Completed for this Encounter: Yes  Doug Bucklin S 11/23/2014 5:38 AM

## 2014-11-23 NOTE — ED Notes (Signed)
Pt reports that he cut himself to get a release.  Sts "family issues" are causing him stress.  Hx of depression.  Denies SI/HI/AV.

## 2015-08-23 ENCOUNTER — Emergency Department (HOSPITAL_COMMUNITY)
Admission: EM | Admit: 2015-08-23 | Discharge: 2015-08-23 | Disposition: A | Discharge status: Home / Self Care | Payer: No Typology Code available for payment source | Attending: Emergency Medicine | Admitting: Emergency Medicine

## 2015-08-23 ENCOUNTER — Emergency Department (HOSPITAL_COMMUNITY): Payer: No Typology Code available for payment source

## 2015-08-23 ENCOUNTER — Encounter (HOSPITAL_COMMUNITY): Payer: Self-pay | Admitting: Emergency Medicine

## 2015-08-23 DIAGNOSIS — S29002A Unspecified injury of muscle and tendon of back wall of thorax, initial encounter: Secondary | ICD-10-CM | POA: Diagnosis not present

## 2015-08-23 DIAGNOSIS — T07XXXA Unspecified multiple injuries, initial encounter: Secondary | ICD-10-CM

## 2015-08-23 DIAGNOSIS — F17200 Nicotine dependence, unspecified, uncomplicated: Secondary | ICD-10-CM | POA: Diagnosis not present

## 2015-08-23 DIAGNOSIS — S199XXA Unspecified injury of neck, initial encounter: Secondary | ICD-10-CM | POA: Diagnosis not present

## 2015-08-23 DIAGNOSIS — Y9389 Activity, other specified: Secondary | ICD-10-CM | POA: Insufficient documentation

## 2015-08-23 DIAGNOSIS — S3992XA Unspecified injury of lower back, initial encounter: Secondary | ICD-10-CM | POA: Insufficient documentation

## 2015-08-23 DIAGNOSIS — S50811A Abrasion of right forearm, initial encounter: Secondary | ICD-10-CM | POA: Diagnosis not present

## 2015-08-23 DIAGNOSIS — Y998 Other external cause status: Secondary | ICD-10-CM | POA: Insufficient documentation

## 2015-08-23 DIAGNOSIS — S50812A Abrasion of left forearm, initial encounter: Secondary | ICD-10-CM | POA: Diagnosis not present

## 2015-08-23 DIAGNOSIS — Z23 Encounter for immunization: Secondary | ICD-10-CM | POA: Insufficient documentation

## 2015-08-23 DIAGNOSIS — Y92410 Unspecified street and highway as the place of occurrence of the external cause: Secondary | ICD-10-CM | POA: Insufficient documentation

## 2015-08-23 DIAGNOSIS — S0990XA Unspecified injury of head, initial encounter: Secondary | ICD-10-CM | POA: Diagnosis not present

## 2015-08-23 DIAGNOSIS — S59911A Unspecified injury of right forearm, initial encounter: Secondary | ICD-10-CM | POA: Diagnosis present

## 2015-08-23 DIAGNOSIS — M546 Pain in thoracic spine: Secondary | ICD-10-CM

## 2015-08-23 MED ORDER — NAPROXEN 500 MG PO TABS: 500.0000 mg | ORAL_TABLET | Freq: Two times a day (BID) | ORAL | Status: DC

## 2015-08-23 MED ORDER — BACITRACIN ZINC 500 UNIT/GM EX OINT
TOPICAL_OINTMENT | Freq: Two times a day (BID) | CUTANEOUS | Status: DC
  Administered 2015-08-23: 1 via TOPICAL

## 2015-08-23 MED ORDER — CYCLOBENZAPRINE HCL 10 MG PO TABS: 10.0000 mg | ORAL_TABLET | Freq: Two times a day (BID) | ORAL | Status: DC | PRN

## 2015-08-23 MED ORDER — CYCLOBENZAPRINE HCL 10 MG PO TABS
10.0000 mg | ORAL_TABLET | Freq: Once | ORAL | Status: AC
  Administered 2015-08-23: 10 mg via ORAL
  Filled 2015-08-23: qty 1

## 2015-08-23 MED ORDER — TETANUS-DIPHTH-ACELL PERTUSSIS 5-2.5-18.5 LF-MCG/0.5 IM SUSP
0.5000 mL | Freq: Once | INTRAMUSCULAR | Status: AC
  Administered 2015-08-23: 0.5 mL via INTRAMUSCULAR
  Filled 2015-08-23: qty 0.5
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
## 2015-08-26 ENCOUNTER — Encounter (HOSPITAL_COMMUNITY): Payer: Self-pay | Admitting: Oncology

## 2015-08-26 ENCOUNTER — Emergency Department (HOSPITAL_COMMUNITY)
Admission: EM | Admit: 2015-08-26 | Discharge: 2015-08-27 | Disposition: A | Discharge status: Other Acute Inpatient Hospital | Attending: Emergency Medicine | Admitting: Emergency Medicine

## 2015-08-26 DIAGNOSIS — F329 Major depressive disorder, single episode, unspecified: Secondary | ICD-10-CM | POA: Insufficient documentation

## 2015-08-26 DIAGNOSIS — Z7289 Other problems related to lifestyle: Secondary | ICD-10-CM

## 2015-08-26 DIAGNOSIS — Y92481 Parking lot as the place of occurrence of the external cause: Secondary | ICD-10-CM | POA: Insufficient documentation

## 2015-08-26 DIAGNOSIS — S50812A Abrasion of left forearm, initial encounter: Secondary | ICD-10-CM | POA: Insufficient documentation

## 2015-08-26 DIAGNOSIS — Y939 Activity, unspecified: Secondary | ICD-10-CM | POA: Insufficient documentation

## 2015-08-26 DIAGNOSIS — F32A Depression, unspecified: Secondary | ICD-10-CM

## 2015-08-26 DIAGNOSIS — Y999 Unspecified external cause status: Secondary | ICD-10-CM | POA: Insufficient documentation

## 2015-08-26 DIAGNOSIS — F322 Major depressive disorder, single episode, severe without psychotic features: Secondary | ICD-10-CM | POA: Diagnosis present

## 2015-08-26 DIAGNOSIS — Z046 Encounter for general psychiatric examination, requested by authority: Secondary | ICD-10-CM | POA: Insufficient documentation

## 2015-08-26 DIAGNOSIS — X789XXA Intentional self-harm by unspecified sharp object, initial encounter: Secondary | ICD-10-CM | POA: Insufficient documentation

## 2015-08-26 DIAGNOSIS — T07XXXA Unspecified multiple injuries, initial encounter: Secondary | ICD-10-CM

## 2015-08-26 DIAGNOSIS — F17200 Nicotine dependence, unspecified, uncomplicated: Secondary | ICD-10-CM | POA: Insufficient documentation

## 2015-08-26 DIAGNOSIS — IMO0002 Reserved for concepts with insufficient information to code with codable children: Secondary | ICD-10-CM

## 2015-08-26 LAB — RAPID URINE DRUG SCREEN, HOSP PERFORMED
Amphetamines: NOT DETECTED
BARBITURATES: NOT DETECTED
BENZODIAZEPINES: NOT DETECTED
Cocaine: NOT DETECTED
Opiates: NOT DETECTED
Tetrahydrocannabinol: POSITIVE — AB

## 2015-08-26 LAB — CBC
HCT: 43 % (ref 39.0–52.0)
Hemoglobin: 15.9 g/dL (ref 13.0–17.0)
MCH: 32.1 pg (ref 26.0–34.0)
MCHC: 37 g/dL — ABNORMAL HIGH (ref 30.0–36.0)
MCV: 86.7 fL (ref 78.0–100.0)
Platelets: 242 10*3/uL (ref 150–400)
RBC: 4.96 MIL/uL (ref 4.22–5.81)
RDW: 12.6 % (ref 11.5–15.5)
WBC: 6.9 10*3/uL (ref 4.0–10.5)

## 2015-08-26 MED ORDER — ZOLPIDEM TARTRATE 5 MG PO TABS: 5.0000 mg | ORAL_TABLET | Freq: Every evening | ORAL | Status: DC | PRN

## 2015-08-26 MED ORDER — LORAZEPAM 1 MG PO TABS
1.0000 mg | ORAL_TABLET | Freq: Three times a day (TID) | ORAL | Status: DC | PRN
Start: 2015-08-26 — End: 2015-08-27

## 2015-08-26 MED ORDER — NICOTINE 21 MG/24HR TD PT24
21.0000 mg | MEDICATED_PATCH | Freq: Every day | TRANSDERMAL | Status: DC
  Administered 2015-08-27: 21 mg via TRANSDERMAL
  Filled 2015-08-26: qty 1

## 2015-08-26 MED ORDER — IBUPROFEN 200 MG PO TABS: 600.0000 mg | ORAL_TABLET | Freq: Three times a day (TID) | ORAL | Status: DC | PRN
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
## 2015-08-26 NOTE — ED Notes (Signed)
Pt reports having trouble w/ his s/o which is why he presented tonight.  [employer] informed this writer that pt's s/o is taking out IVC papers at this time.  [employer] officer is outside the room.  Pt changing into scrubs at this time.
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
## 2015-08-27 DIAGNOSIS — F322 Major depressive disorder, single episode, severe without psychotic features: Secondary | ICD-10-CM | POA: Diagnosis present

## 2015-08-27 DIAGNOSIS — R45851 Suicidal ideations: Secondary | ICD-10-CM

## 2015-08-27 LAB — COMPREHENSIVE METABOLIC PANEL
ALT: 34 U/L (ref 17–63)
AST: 25 U/L (ref 15–41)
Albumin: 4.7 g/dL (ref 3.5–5.0)
Alkaline Phosphatase: 41 U/L (ref 38–126)
Anion gap: 6 (ref 5–15)
BUN: 11 mg/dL (ref 6–20)
CHLORIDE: 107 mmol/L (ref 101–111)
CO2: 24 mmol/L (ref 22–32)
CREATININE: 1.14 mg/dL (ref 0.61–1.24)
Calcium: 9.1 mg/dL (ref 8.9–10.3)
GFR calc Af Amer: >60 mL/min (ref >60)
Glucose, Bld: 81 mg/dL (ref 65–99)
Potassium: 3.7 mmol/L (ref 3.5–5.1)
Sodium: 137 mmol/L (ref 135–145)
Total Bilirubin: 0.7 mg/dL (ref 0.3–1.2)
Total Protein: 7.9 g/dL (ref 6.5–8.1)

## 2015-08-27 LAB — ETHANOL

## 2015-08-27 LAB — SALICYLATE LEVEL: Salicylate Lvl: <4 mg/dL (ref 2.8–30.0)

## 2015-08-27 LAB — ACETAMINOPHEN LEVEL: Acetaminophen (Tylenol), Serum: <10 ug/mL — ABNORMAL LOW (ref 10–30)

## 2015-08-27 MED ORDER — CITALOPRAM HYDROBROMIDE 10 MG PO TABS
10.0000 mg | ORAL_TABLET | Freq: Every day | ORAL | Status: DC
  Administered 2015-08-27: 10 mg via ORAL
  Filled 2015-08-27: qty 1

## 2015-08-27 MED ORDER — CARBAMAZEPINE 200 MG PO TABS
200.0000 mg | ORAL_TABLET | Freq: Two times a day (BID) | ORAL | Status: DC
  Administered 2015-08-27 (×2): 200 mg via ORAL
  Filled 2015-08-27 (×3): qty 1
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
## 2015-08-27 NOTE — ED Notes (Addendum)
Pt behavior calm. Sad affect. Pt denies SI/HI. Denies AVH, Pt reports he slept good last night. Pt reports a hx of cutting; reports the last time he cut was last night, reports he cut to relieve stress. Superficial cuts noted to left forearm.Pt noted on the hallway phone at times throughout the shift. Compliant with medication regimen. Special checks q 15 mins in place for safety. Video monitoring in place.
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
## 2015-08-27 NOTE — BH Assessment (Signed)
[facility] Assessment Progress Note This Clinical research associatewriter contacted patient's partner (with patient's permission) this date Jonathan [name], to obtain collateral information in reference to patient's treatment.  [name] stated she feels the patient has been experiencing less agitation the last few weeks but states patient continues to be verbally aggressive with her at times. Patient stated she does feel safe with patient at the residence but "not quite yet." Patient states she is still processing "issues" associated with there relationship. [name] stated she would like to speak to the patient prior to discharge. This information was given to the patient with patient stating he would contact her later this date.

## 2015-08-27 NOTE — ED Notes (Signed)
Patient noted in room. No complaints, stable, in no acute distress. Pt denies SI, HI, A V H, depression and anxiety at this time.  Q15 minute rounds and monitoring via security cameras continue for safety.

## 2015-08-27 NOTE — Progress Notes (Signed)
Disposition CSW completed patient referrals to the following inpatient psych facilities:  [facility]  CSW will continue to follow patient for placement needs.  Seward SpeckLeo Corderro Koloski [facility] LtdCSW,LCAS Behavioral Health Disposition CSW [phone]
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
## 2015-08-27 NOTE — ED Notes (Signed)
[facility] picked up pt, report given to brook. Pt denies SI, H,I, A/ VH at this time. [facility] and all belongings given to driver.

## 2015-08-28 ENCOUNTER — Inpatient Hospital Stay (HOSPITAL_COMMUNITY)
Admission: AD | Admit: 2015-08-28 | Discharge: 2015-08-31 | DRG: 885 | Disposition: A | Discharge status: Home / Self Care | Payer: No Typology Code available for payment source | Source: Intra-hospital | Attending: Psychiatry | Admitting: Psychiatry

## 2015-08-28 ENCOUNTER — Encounter (HOSPITAL_COMMUNITY): Payer: Self-pay | Admitting: *Deleted

## 2015-08-28 DIAGNOSIS — F419 Anxiety disorder, unspecified: Secondary | ICD-10-CM | POA: Diagnosis present

## 2015-08-28 DIAGNOSIS — F4000 Agoraphobia, unspecified: Secondary | ICD-10-CM | POA: Diagnosis present

## 2015-08-28 DIAGNOSIS — R45851 Suicidal ideations: Secondary | ICD-10-CM | POA: Diagnosis present

## 2015-08-28 DIAGNOSIS — F17210 Nicotine dependence, cigarettes, uncomplicated: Secondary | ICD-10-CM | POA: Diagnosis present

## 2015-08-28 DIAGNOSIS — Z915 Personal history of self-harm: Secondary | ICD-10-CM | POA: Diagnosis not present

## 2015-08-28 DIAGNOSIS — G4700 Insomnia, unspecified: Secondary | ICD-10-CM | POA: Diagnosis present

## 2015-08-28 DIAGNOSIS — F322 Major depressive disorder, single episode, severe without psychotic features: Principal | ICD-10-CM | POA: Diagnosis present

## 2015-08-28 MED ORDER — TRAZODONE HCL 50 MG PO TABS
50.0000 mg | ORAL_TABLET | Freq: Every evening | ORAL | Status: DC | PRN
  Administered 2015-08-28 – 2015-08-30 (×4): 50 mg via ORAL
  Filled 2015-08-28 (×2): qty 1
  Filled 2015-08-28: qty 7
  Filled 2015-08-28: qty 1

## 2015-08-28 MED ORDER — TRAZODONE HCL 50 MG PO TABS
ORAL_TABLET | ORAL | Status: AC
  Filled 2015-08-28: qty 1

## 2015-08-28 MED ORDER — IBUPROFEN 600 MG PO TABS
600.0000 mg | ORAL_TABLET | Freq: Three times a day (TID) | ORAL | Status: DC | PRN
  Administered 2015-08-28 (×2): 600 mg via ORAL
  Filled 2015-08-28 (×2): qty 1

## 2015-08-28 MED ORDER — NICOTINE 21 MG/24HR TD PT24
21.0000 mg | MEDICATED_PATCH | Freq: Every day | TRANSDERMAL | Status: DC
  Administered 2015-08-28 – 2015-08-29 (×2): 21 mg via TRANSDERMAL
  Filled 2015-08-28 (×7): qty 1

## 2015-08-28 MED ORDER — PNEUMOCOCCAL VAC POLYVALENT 25 MCG/0.5ML IJ INJ
0.5000 mL | INJECTION | INTRAMUSCULAR | Status: AC
  Administered 2015-08-29: 0.5 mL via INTRAMUSCULAR

## 2015-08-28 MED ORDER — CARBAMAZEPINE 200 MG PO TABS
200.0000 mg | ORAL_TABLET | Freq: Two times a day (BID) | ORAL | Status: DC
  Administered 2015-08-28: 200 mg via ORAL
  Filled 2015-08-28 (×5): qty 1

## 2015-08-28 MED ORDER — DIVALPROEX SODIUM ER 500 MG PO TB24
500.0000 mg | ORAL_TABLET | Freq: Every day | ORAL | Status: DC
  Administered 2015-08-28 – 2015-08-30 (×3): 500 mg via ORAL
  Filled 2015-08-28 (×4): qty 1

## 2015-08-28 MED ORDER — CITALOPRAM HYDROBROMIDE 10 MG PO TABS
10.0000 mg | ORAL_TABLET | Freq: Every day | ORAL | Status: DC
  Administered 2015-08-28 – 2015-08-31 (×4): 10 mg via ORAL
  Filled 2015-08-28 (×3): qty 1
  Filled 2015-08-28: qty 7
  Filled 2015-08-28 (×2): qty 1

## 2015-08-28 MED ORDER — ACETAMINOPHEN 325 MG PO TABS: 650.0000 mg | ORAL_TABLET | Freq: Four times a day (QID) | ORAL | Status: DC | PRN

## 2015-08-28 NOTE — Progress Notes (Signed)
D Aamir [resents to the med window this morning about 0900. He is cautious.quiet. He has a flat affect. He takes his scheduled celexa and tegretol and nicoderm patch. He says he slept " ok" last night. A HE completed his daily assessment and on it he wrote he deneid SI today and he rated his depression, hopelessness and anxeity " 5/0/0", respectively. R Safety in place.

## 2015-08-28 NOTE — [facility] Suicide Risk Assessment (Signed)
[facility] INPATIENT:  Family/Significant Other Suicide Prevention Education  Suicide Prevention Education:  Patient Refusal for Family/Significant Other Suicide Prevention Education: The patient Jonathan [name] has refused to provide written consent for family/significant other to be provided Family/Significant Other Suicide Prevention Education during admission and/or prior to discharge.  Physician notified.  Beverly SessionsLINDSEY, Sparrow Sanzo J 08/28/2015, 5:11 PM

## 2015-08-28 NOTE — Progress Notes (Signed)
Adult Psychoeducational Group Note  Date:  08/28/2015 Time:  8:15 PM  Group Topic/Focus:  Wrap-Up Group:   The focus of this group is to help patients review their daily goal of treatment and discuss progress on daily workbooks.  Participation Level:  Active  Participation Quality:  Appropriate  Affect:  Appropriate  Cognitive:  Appropriate  Insight: Appropriate  Engagement in Group:  Engaged  Modes of Intervention:  Discussion  Additional Comments:  Pt rated his overall day a 7 out of 10 because he got to go outside today. Pt reported that he did not have a goal for the day.   Cleotilde NeerJasmine S Josalyn Dettmann 08/28/2015, 8:41 PM

## 2015-08-28 NOTE — [facility] Suicide Risk Assessment (Signed)
[facility]   Nursing information obtained from:   patient and chart  Demographic factors:   20 year old single male, unemployed, lives with GF  Current Mental Status:   see below  Loss Factors:   legal issues , mother and grandmother physically ill Historical Factors:   depression, intermittent explosiveness  Risk Reduction Factors:   resilience   Total Time spent with patient: 45 minutes Principal Problem: MDD (major depressive disorder), severe (HCC) Diagnosis:   Patient Active Problem List   Diagnosis Date Noted  . MDD (major depressive disorder), severe (HCC) [F32.2] 08/28/2015  . Severe major depression without psychotic features (HCC) [F32.2] 08/27/2015     Continued Clinical Symptoms:  Alcohol Use Disorder Identification Test Final Score (AUDIT): 1 The "Alcohol Use Disorders Identification Test", Guidelines for Use in Primary Care, Second Edition.  World Science writerHealth Organization [facility](WHO). Score between 0-7:  no or low risk or alcohol related problems. Score between 8-15:  moderate risk of alcohol related problems. Score between 16-19:  high risk of alcohol related problems. Score 20 or above:  warrants further diagnostic evaluation for alcohol dependence and treatment.   CLINICAL FACTORS:  20 year old male, admitted due to worsening depression, and recent episode of self cutting - multiple self inflicted superficial cuts to forearm. Reports also episodes of intermittent explosiveness of short duration, but which have resulted in relationship and legal issues, suggestive of Intermittent Explosive Disorder      Psychiatric Specialty Exam: Physical Exam  ROS  Blood pressure 101/69, pulse 98, temperature 98.1 F (36.7 C), temperature source Oral, resp. rate 16, height 5\' 8"  (1.727 m), weight 136 lb (61.689 kg).Body mass index is 20.68 kg/(m^2).   see admit note MSE                                                          COGNITIVE FEATURES THAT CONTRIBUTE TO RISK:  Closed-mindedness and Loss of executive function    SUICIDE RISK:   Moderate:  Frequent suicidal ideation with limited intensity, and duration, some specificity in terms of plans, no associated intent, good self-control, limited dysphoria/symptomatology, some risk factors present, and identifiable protective factors, including available and accessible social support.  PLAN OF CARE: Patient will be admitted to inpatient psychiatric unit for stabilization and safety. Will provide and encourage milieu participation. Provide medication management and maked adjustments as needed.  Will follow daily.    I certify that inpatient services furnished can reasonably be expected to improve the patient's condition.   Nehemiah MassedOBOS, FERNANDO, MD 08/28/2015, 2:03 PM

## 2015-08-28 NOTE — Tx Team (Signed)
Initial Interdisciplinary Treatment Plan   PATIENT STRESSORS: Educational concerns Marital or family conflict   PATIENT STRENGTHS: Ability for insight Active sense of humor Average or above average intelligence Capable of independent living General fund of knowledge Motivation for treatment/growth   PROBLEM LIST: Problem List/Patient Goals Date to be addressed Date deferred Reason deferred Estimated date of resolution  Depression 08/28/15     Suicidal thoughts 08/28/15     "Work on my depression and my anger" 08/28/15                                          DISCHARGE CRITERIA:  Ability to meet basic life and health needs Improved stabilization in mood, thinking, and/or behavior Verbal commitment to aftercare and medication compliance  PRELIMINARY DISCHARGE PLAN: Attend aftercare/continuing care group Return to previous living arrangement  PATIENT/FAMIILY INVOLVEMENT: This treatment plan has been presented to and reviewed with the patient, Jonathan [name], and/or family member, .  The patient and family have been given the opportunity to ask questions and make suggestions.  Kaytlynne Neace, [place] 08/28/2015, 12:56 AM

## 2015-08-28 NOTE — Progress Notes (Signed)
20 year old male pt admitted on voluntary basis. Pt reports on-going issues in his relationship with his girlfriend as well as problems at school and also other problems with his family. Pt reported feeling overwhelmed and said that he cut himself to help alleviate his stress. [facility] but reports not being on any medications for the past few months. Pt does report that he would like to get back on his medications while he is here. Pt reports living with his girlfriend currently but stated that he may go live with his mother after discharge. Pt does endorse depression but denies SI on admission and able to contract for safety while in the hospital. Pt was oriented to the unit and safety maintained.

## 2015-08-28 NOTE — [facility] Group Notes (Signed)
[facility] LCSW Group Therapy  08/28/2015 10:00 AM  Type of Therapy:  Group Therapy  Participation Level:  Did Not Attend   Beverly SessionsLINDSEY, Stina Gane J 08/28/2015, 12:48 PM

## 2015-08-28 NOTE — H&P (Signed)
Psychiatric Admission Assessment Adult  Patient Identification: Jonathan [name] MRN:  [id number] Date of Evaluation:  08/28/2015 Chief Complaint:   " I have been stressed and depressed " Principal Diagnosis:  MDD  Diagnosis:   Patient Active Problem List   Diagnosis Date Noted  . MDD (major depressive disorder), severe ([place]) [F32.2] 08/28/2015  . Severe major depression without psychotic features ([place]) [F32.2] 08/27/2015   History of Present Illness: 20 year old male, who reports history of mood disorder/depression. States he has been feeling depressed, sad, for " a while". States that he has been facing some significant stressors, to include mother and grandmother being physically ill.  Two nights ago, in the context of an argument with his GF,he made several self inflicted lacerations on forearm ( superficial, no sutures ), at which time she called 911 and he was brought to hospital.  Associated Signs/Symptoms: Depression Symptoms:  depressed mood, anhedonia, insomnia, suicidal thoughts with specific plan, suicidal attempt, anxiety, loss of energy/fatigue, decreased appetite, states he has lost several pounds over recent weeks (Hypo) Manic Symptoms:  Does not endorse  Anxiety Symptoms:   Describes free floating anxiety, and some agoraphobia Psychotic Symptoms: denies  PTSD Symptoms: Denies  Total Time spent with patient: 45 minutes  Past Psychiatric History:  Describes history of depression, no clear history of mania or hypomania, but  describes history of intermittent explosive disorder, states " I blow up easily, and then a few minutes later I'm OK, but it's already too late, I have already hurt someone's feelings or broken something "  Denies history of psychosis. Describes some social anxiety and agoraphobia, but no clear panic attacks.  History of self cutting episodes starting about one year ago. No suicide attempts.   Is the patient at risk to self? Yes.    Has the  patient been a risk to self in the past 6 months? Yes.    Has the patient been a risk to self within the distant past? Yes.    Is the patient a risk to others? No.  Has the patient been a risk to others in the past 6 months? No.  Has the patient been a risk to others within the distant past? No.   Prior Inpatient Therapy:  no prior psychiatric admissions  Prior Outpatient Therapy:  Had been going to [facility]   Alcohol Screening: 1. How often do you have a drink containing alcohol?: Monthly or less 2. How many drinks containing alcohol do you have on a typical day when you are drinking?: 1 or 2 3. How often do you have six or more drinks on one occasion?: Never Preliminary Score: 0 9. Have you or someone else been injured as a result of your drinking?: No 10. Has a relative or friend or a doctor or another health worker been concerned about your drinking or suggested you cut down?: No Alcohol Use Disorder Identification Test Final Score (AUDIT): 1 Brief Intervention: AUDIT score less than 7 or less-screening does not suggest unhealthy drinking-brief intervention not indicated Substance Abuse History in the last 12 months:  Denies alcohol or drug abuse  Consequences of Substance Abuse: No  Previous Psychotropic Medications:  Celexa - states " it helped, but I stopped taking because I was feeling better".  Had been off it for several months  Psychological Evaluations: No  Past Medical History: Denies  Family History: parents alive, separated, limited contact with father, has two siblings  Family Psychiatric  History:  No mental illness in  family, no suicides in family, mother has history of alcohol abuse  Tobacco Screening: smokes 2 cigarettes per day  Social History: lives with GF, has no children, currently in school, taking GED, upcoming court date for assault .  History  Alcohol Use No    Comment: reports he does not drink     History  Drug Use  . Yes  . Special: Marijuana     Additional Social History:  Allergies:  No Known Allergies Lab Results:  Results for orders placed or performed during the hospital encounter of 08/26/15 (from the past 48 hour(s))  Rapid urine drug screen (hospital performed)     Status: Abnormal   Collection Time: 08/26/15 11:22 PM  Result Value Ref Range   Opiates NONE DETECTED NONE DETECTED   Cocaine NONE DETECTED NONE DETECTED   Benzodiazepines NONE DETECTED NONE DETECTED   Amphetamines NONE DETECTED NONE DETECTED   Tetrahydrocannabinol POSITIVE (A) NONE DETECTED   Barbiturates NONE DETECTED NONE DETECTED    Comment:        DRUG SCREEN FOR MEDICAL PURPOSES ONLY.  IF CONFIRMATION IS NEEDED FOR ANY PURPOSE, NOTIFY LAB WITHIN 5 DAYS.        LOWEST DETECTABLE LIMITS FOR URINE DRUG SCREEN Drug Class       Cutoff (ng/mL) Amphetamine      1000 Barbiturate      200 Benzodiazepine   465 Tricyclics       035 Opiates          300 Cocaine          300 THC              50   Comprehensive metabolic panel     Status: None   Collection Time: 08/26/15 11:24 PM  Result Value Ref Range   Sodium 137 135 - 145 mmol/L   Potassium 3.7 3.5 - 5.1 mmol/L   Chloride 107 101 - 111 mmol/L   CO2 24 22 - 32 mmol/L   Glucose, Bld 81 65 - 99 mg/dL   BUN 11 6 - 20 mg/dL   Creatinine, Ser 1.14 0.61 - 1.24 mg/dL   Calcium 9.1 8.9 - 10.3 mg/dL   Total Protein 7.9 6.5 - 8.1 g/dL   Albumin 4.7 3.5 - 5.0 g/dL   AST 25 15 - 41 U/L   ALT 34 17 - 63 U/L   Alkaline Phosphatase 41 38 - 126 U/L   Total Bilirubin 0.7 0.3 - 1.2 mg/dL   GFR calc non Af Amer >60 >60 mL/min   GFR calc Af Amer >60 >60 mL/min    Comment: (NOTE) The eGFR has been calculated using the CKD EPI equation. This calculation has not been validated in all clinical situations. eGFR's persistently <60 mL/min signify possible Chronic Kidney Disease.    Anion gap 6 5 - 15  Ethanol     Status: None   Collection Time: 08/26/15 11:24 PM  Result Value Ref Range   Alcohol, Ethyl (B) <5  <5 mg/dL    Comment:        LOWEST DETECTABLE LIMIT FOR SERUM ALCOHOL IS 5 mg/dL FOR MEDICAL PURPOSES ONLY   Salicylate level     Status: None   Collection Time: 08/26/15 11:24 PM  Result Value Ref Range   Salicylate Lvl <4.6 2.8 - 30.0 mg/dL  Acetaminophen level     Status: Abnormal   Collection Time: 08/26/15 11:24 PM  Result Value Ref Range   Acetaminophen (Tylenol), Serum <10 (L)  10 - 30 ug/mL    Comment:        THERAPEUTIC CONCENTRATIONS VARY SIGNIFICANTLY. A RANGE OF 10-30 ug/mL MAY BE AN EFFECTIVE CONCENTRATION FOR MANY PATIENTS. HOWEVER, SOME ARE BEST TREATED AT CONCENTRATIONS OUTSIDE THIS RANGE. ACETAMINOPHEN CONCENTRATIONS >150 ug/mL AT 4 HOURS AFTER INGESTION AND >50 ug/mL AT 12 HOURS AFTER INGESTION ARE OFTEN ASSOCIATED WITH TOXIC REACTIONS.   cbc     Status: Abnormal   Collection Time: 08/26/15 11:24 PM  Result Value Ref Range   WBC 6.9 4.0 - 10.5 K/uL   RBC 4.96 4.22 - 5.81 MIL/uL   Hemoglobin 15.9 13.0 - 17.0 g/dL   HCT 43.0 39.0 - 52.0 %   MCV 86.7 78.0 - 100.0 fL   MCH 32.1 26.0 - 34.0 pg   MCHC 37.0 (H) 30.0 - 36.0 g/dL   RDW 12.6 11.5 - 15.5 %   Platelets 242 150 - 400 K/uL    Blood Alcohol level:  Lab Results  Component Value Date   ETH <5 08/26/2015   ETH <5 62/26/3335    Metabolic Disorder Labs:  No results found for: HGBA1C, MPG No results found for: PROLACTIN No results found for: CHOL, TRIG, HDL, CHOLHDL, VLDL, LDLCALC  Current Medications: Current Facility-Administered Medications  Medication Dose Route Frequency Provider Last Rate Last Dose  . acetaminophen (TYLENOL) tablet 650 mg  650 mg Oral Q6H PRN Lurena Nida, NP      . carbamazepine (TEGRETOL) tablet 200 mg  200 mg Oral BID Lurena Nida, NP   200 mg at 08/28/15 0911  . citalopram (CELEXA) tablet 10 mg  10 mg Oral Daily Lurena Nida, NP   10 mg at 08/28/15 0911  . ibuprofen (ADVIL,MOTRIN) tablet 600 mg  600 mg Oral Q8H PRN Lurena Nida, NP   600 mg at 08/28/15 0037  .  nicotine (NICODERM CQ - dosed in mg/24 hours) patch 21 mg  21 mg Transdermal Daily Lurena Nida, NP   21 mg at 08/28/15 0911  . [START ON 08/29/2015] pneumococcal 23 valent vaccine (PNU-IMMUNE) injection 0.5 mL  0.5 mL Intramuscular Tomorrow-1000 Myer Peer Cobos, MD      . traZODone (DESYREL) tablet 50 mg  50 mg Oral QHS PRN Lurena Nida, NP   50 mg at 08/28/15 0038   PTA Medications: Prescriptions prior to admission  Medication Sig Dispense Refill Last Dose  . cyclobenzaprine (FLEXERIL) 10 MG tablet Take 1 tablet (10 mg total) by mouth 2 (two) times daily as needed for muscle spasms. (Patient not taking: Reported on 08/26/2015) 20 tablet 0 Not Taking at Unknown time  . naproxen (NAPROSYN) 500 MG tablet Take 1 tablet (500 mg total) by mouth 2 (two) times daily. (Patient not taking: Reported on 08/26/2015) 30 tablet 0 Not Taking at Unknown time    Musculoskeletal: Strength & Muscle Tone: within normal limits Gait & Station: normal Patient leans: N/A  Psychiatric Specialty Exam: Physical Exam  Review of Systems  Constitutional: Negative.   HENT: Negative.   Eyes: Negative.   Respiratory: Negative.   Cardiovascular: Negative.   Gastrointestinal: Negative.   Genitourinary: Negative.   Musculoskeletal: Negative.   Skin: Negative.   Neurological: Negative for seizures.  Endo/Heme/Allergies: Negative.   Psychiatric/Behavioral: Positive for depression and suicidal ideas.    Blood pressure 101/69, pulse 98, temperature 98.1 F (36.7 C), temperature source Oral, resp. rate 16, height _0  (1.727 m), weight 136 lb (61.689 kg).Body mass index is 20.68 kg/(m^2).  General  Appearance: Fairly Groomed  Eye Contact:  Good  Speech:  Normal Rate  Volume:  Normal  Mood:  Depressed  Affect:  Constricted, not irritable or expansive at this time   Thought Process:  Linear  Orientation:  Full (Time, Place, and Person)  Thought Content:  denies hallucinations, no delusions   Suicidal Thoughts:  No-  denies any current suicidal or self injurious ideations on unit, contracts for safety   Homicidal Thoughts:  No denies any violent or homicidal ideations   Memory:  recent and remote grossly intact   Judgement:  Fair  Insight:  Fair  Psychomotor Activity:  Normal  Concentration:  Concentration: Good and Attention Span: Good  Recall:  Good  Fund of Knowledge:  Good  Language:  Good  Akathisia:  Negative  Handed:  Right  AIMS (if indicated):     Assets:  Desire for Improvement Resilience  ADL's:  Intact  Cognition:  WNL  Sleep:  Number of Hours: 4.75       Treatment Plan Summary: Daily contact with patient to assess and evaluate symptoms and progress in treatment, Medication management, Plan inpatient admission and medications as below  Observation Level/Precautions:  15 minute checks  Laboratory:  as needed   Psychotherapy:  Milieu, support   Medications:  Depakote ER 500 mgrs QHS, Celexa 10 mgrs QAM   Consultations:  Milieu   Discharge Concerns:  -   Estimated LOS: 5 days   Other:     I certify that inpatient services furnished can reasonably be expected to improve the patient's condition.    Neita Garnet, MD 5/28/20171:41 PM

## 2015-08-28 NOTE — B[facility] Counselor (Signed)
Adult Comprehensive Assessment  Patient ID: Jonathan [name], male   DOB: January 12, 1996, 20 y.o.   MRN: [id number]  Information Source: Information source: Patient  Current Stressors:  Educational / Learning stressors: No Employment / Job issues: Yes. Trying to find a job Family Relationships: yes. Family does not have a relaitonship because of persone he is dating.  Financial / Lack of resources (include bankruptcy): No Housing / Lack of housing: Yes. Seeking placement Physical health (include injuries & life threatening diseases): No Social relationships: yes. Issues with girlfriend, was living with her and canot return.  Substance abuse: No Bereavement / Loss: No  Living/Environment/Situation:  Living Arrangements: Alone Living conditions (as described by patient or guardian): Currently in transition How long has patient lived in current situation?: Since 08/27/15  Family History:  Marital status: Single Are you sexually active?: Yes What is your sexual orientation?: heterosexual Has your sexual activity been affected by drugs, alcohol, medication, or emotional stress?: No Does patient have children?: No  Childhood History:  By whom was/is the patient raised?: Father Description of patient's relationship with caregiver when they were a child: It was okay Patient's description of current relationship with people who raised him/her: Don't talk much, but goes to see him.  How were you disciplined when you got in trouble as a child/adolescent?: "whooping" that was it Does patient have siblings?: Yes Number of Siblings: 2 Description of patient's current relationship with siblings: Relationship with brother is okay. Sister doesn't talk to him because of his girlfriend Did patient suffer any verbal/emotional/physical/sexual abuse as a child?: No Did patient suffer from severe childhood neglect?: No Has patient ever been sexually abused/assaulted/raped as an adolescent or adult?: No Was  the patient ever a victim of a crime or a disaster?: No Witnessed domestic violence?: No Has patient been effected by domestic violence as an adult?: No  Education:  Highest grade of school patient has completed: 11th Grade Currently a student?: Yes Name of school: [employer] How long has the patient attended?: 3 - 4 months Learning disability?: No  Employment/Work Situation:   Employment situation: Unemployed Patient's job has been impacted by current illness: Yes Describe how patient's job has been impacted: Missed work at last job because he wasn't going due to low mood What is the longest time patient has a held a job?: 6 months Where was the patient employed at that time?: [employer] Has patient ever been in the [employer]?: No Has patient ever served in combat?: No Did You Receive Any Psychiatric Treatment/Services While in Equities trader?: No Are There Guns or Other Weapons in Your Home?: No  Financial Resources:   Financial resources: No income Does patient have a Lawyer or guardian?: No  Alcohol/Substance Abuse:   What has been your use of drugs/alcohol within the last 12 months?: Marijuana about 2x weekly.  If attempted suicide, did drugs/alcohol play a role in this?: No Alcohol/Substance Abuse Treatment Hx: Denies past history Has alcohol/substance abuse ever caused legal problems?: No  Social Support System:   Conservation officer, nature Support System: Fair Development worker, community Support System: Only has girlfriend Type of faith/religion: No  Leisure/Recreation:   Leisure and Hobbies: Play basketbal  Strengths/Needs:   What things does the patient do well?: Draw In what areas does patient struggle / problems for patient: School, difficult to concentrate at times  Discharge Plan:   Does patient have access to transportation?: Yes Will patient be returning to same living situation after discharge?: No Plan for  living situation after discharge: Needs housing  resources and informaiton for shelters Currently receiving community mental health services: No If no, would patient like referral for services when discharged?: Yes (What county?) [facility]([place] Does patient have financial barriers related to discharge medications?: Yes Patient description of barriers related to discharge medications: No income and no insurance  Summary/Recommendations:   Summary and Recommendations (to be completed by the evaluator): Patient is a 20 year old male who presented to the hospital with self-injury and suicidal thoughts with plan. Patient reports primary triggers for admission was multiple life stressors. Patient will benefit from crisis stabilization medication evaluation, group therapy and psychoeducation in addition to case management for discharge planning. At discharge, it is recommended that patient remain compliant with established discharge plan and continued treatment.  Nathen MayLINDSEY, Shanin Szymanowski J. 08/28/2015

## 2015-08-29 LAB — TSH: TSH: 0.555 u[IU]/mL (ref 0.350–4.500)

## 2015-08-29 NOTE — Progress Notes (Signed)
Pt reports he is doing fine and was glad to see his girlfriend at visitation.  He denies SI/HI/AVH.  He voices no needs or concerns.  He states he was started on medication to help with his mood, and he  is hopeful that the medication will help him with his anger.  He has been pleasant and cooperative with staff.  Support and encouragement offered.  Pt makes his needs known to staff.  Pt plans to discharge to home.  Discharge plans are in process.  Safety maintained with q15 minute checks.

## 2015-08-29 NOTE — Tx Team (Signed)
Interdisciplinary Treatment Plan Update (Adult) Date: 08/29/2015   Date: 08/29/2015 8:45 AM  Progress in Treatment:  Attending groups: Pt is new to milieu, continuing to assess  Participating in groups: Pt is new to milieu, continuing to assess  Taking medication as prescribed: Yes  Tolerating medication: Yes  Family/Significant othe contact made: No, Pt declines Patient understands diagnosis: Continuing to assess Discussing patient identified problems/goals with staff: Yes  Medical problems stabilized or resolved: Yes  Denies suicidal/homicidal ideation: Yes Patient has not harmed self or Others: Yes   New problem(s) identified: None identified at this time.   Discharge Plan or Barriers: CSW will assess for appropriate discharge plan and relevant barriers.   Additional comments:  Patient and CSW reviewed pt's identified goals and treatment plan. Patient verbalized understanding and agreed to treatment plan. CSW reviewed [facility] "Discharge Process and Patient Involvement" Form. Pt verbalized understanding of information provided and signed form.   Reason for Continuation of Hospitalization:  Anxiety Depression Medication stabilization Suicidal ideation  Estimated length of stay: 3-5 days  Review of initial/current patient goals per problem list:   1.  Goal(s): Patient will participate in aftercare plan  Met:  No  Target date: 3-5 days from date of admission   As evidenced by: Patient will participate within aftercare plan AEB aftercare provider and housing plan at discharge being identified.  08/29/15: CSW to work with Pt to assess for appropriate discharge plan and faciliate appointments and referrals as needed prior to d/c.  2.  Goal (s): Patient will exhibit decreased depressive symptoms and suicidal ideations.  Met:  No  Target date: 3-5 days from date of admission   As evidenced by: Patient will utilize self rating of depression at 3 or below and demonstrate decreased  signs of depression or be deemed stable for discharge by MD. 08/29/15: Pt was admitted with symptoms of depression, rating 10/10. Pt continues to present with flat affect and depressive symptoms.  Pt will demonstrate decreased symptoms of depression and rate depression at 3/10 or lower prior to discharge.  3.  Goal(s): Patient will demonstrate decreased signs and symptoms of anxiety.  Met:  No  Target date: 3-5 days from date of admission   As evidenced by: Patient will utilize self rating of anxiety at 3 or below and demonstrated decreased signs of anxiety, or be deemed stable for discharge by MD 08/29/15: Pt was admitted with increased levels of anxiety and is currently rating those symptoms highly. Pt will demonstrated decreased symptoms of anxiety and rate it at 3/10 prior to d/c.  Attendees:  Patient:    Family:    Physician: Dr. Parke Poisson, MD  08/29/2015 8:45 AM  Nursing: Lars Pinks, RN Case manager  08/29/2015 8:45 AM  Clinical Social Worker Peri Maris, [place] 08/29/2015 8:45 AM  Other: Tilden Fossa, LCSWA 08/29/2015 8:45 AM  Clinical:  Darrol Angel, RN; Grayland Ormond, RN 08/29/2015 8:45 AM  Other: , RN Charge Nurse 08/29/2015 8:45 AM  Other: Hilda Lias, [place], [place] Social Work [phone]

## 2015-08-29 NOTE — [facility] Group Notes (Signed)
[facility]  08/29/2015 8:45 AM  Pt did not attend, declined invitation.   Chad CordialLauren Carter, LCSWA 08/29/2015 9:33 AM

## 2015-08-29 NOTE — Progress Notes (Signed)
[facility] MD Progress Note  08/29/2015 5:26 PM Jonathan [name]  MRN:  [id number] Subjective:   Patient reports that he is feeling " a little better " today, but continues to report depression, sadness . Denies medication side effects at this time. Objective : I have reviewed chart notes and have met with patient.  Patient presents alert, attentive, calm,cooperative . He reports partial improvement but still feels depressed, and his affect remains vaguely constricted, although he does smile briefly at times. He is less ruminative about recent stressors at this time. No disruptive or agitated behaviors on unit, going to some groups. Denies medication side effects. He is on Celexa for depression, and was also started on Depakote ER for history of intermittent explosiveness- we reviewed side effect profiles . Labs - TSH WNL.   Principal Problem: MDD (major depressive disorder), severe ([place]) Diagnosis:   Patient Active Problem List   Diagnosis Date Noted  . MDD (major depressive disorder), severe ([place]) [F32.2] 08/28/2015  . Severe major depression without psychotic features ([place]) [F32.2] 08/27/2015   Total Time spent with patient: 20 minutes    Past Medical History: History reviewed. No pertinent past medical history. History reviewed. No pertinent past surgical history. Family History: History reviewed. No pertinent family history.  Social History:  History  Alcohol Use No    Comment: reports he does not drink     History  Drug Use  . Yes  . Special: Marijuana    Social History   Social History  . Marital Status: Single    Spouse Name: N/A  . Number of Children: N/A  . Years of Education: N/A   Social History Main Topics  . Smoking status: Current Every Day Smoker -- 0.50 packs/day    Types: Cigarettes  . Smokeless tobacco: None  . Alcohol Use: No     Comment: reports he does not drink  . Drug Use: Yes    Special: Marijuana  . Sexual Activity: Not Asked   Other Topics  Concern  . None   Social History Narrative   Additional Social History:   Sleep: improving   Appetite:  improving   Current Medications: Current Facility-Administered Medications  Medication Dose Route Frequency Provider Last Rate Last Dose  . acetaminophen (TYLENOL) tablet 650 mg  650 mg Oral Q6H PRN Lurena Nida, NP      . citalopram (CELEXA) tablet 10 mg  10 mg Oral Daily Lurena Nida, NP   10 mg at 08/29/15 0817  . divalproex (DEPAKOTE ER) 24 hr tablet 500 mg  500 mg Oral QHS Myer Peer Cobos, MD   500 mg at 08/28/15 2213  . ibuprofen (ADVIL,MOTRIN) tablet 600 mg  600 mg Oral Q8H PRN Lurena Nida, NP   600 mg at 08/28/15 2022  . nicotine (NICODERM CQ - dosed in mg/24 hours) patch 21 mg  21 mg Transdermal Daily Lurena Nida, NP   21 mg at 08/29/15 0817  . traZODone (DESYREL) tablet 50 mg  50 mg Oral QHS PRN Lurena Nida, NP   50 mg at 08/28/15 2213    Lab Results:  Results for orders placed or performed during the hospital encounter of 08/28/15 (from the past 48 hour(s))  TSH     Status: None   Collection Time: 08/29/15  6:08 AM  Result Value Ref Range   TSH 0.555 0.350 - 4.500 uIU/mL    Comment: Performed at [facility]    Blood Alcohol level:  Lab Results  Component Value Date   ETH <5 08/26/2015   ETH <5 11/23/2014    Physical Findings: AIMS: Facial and Oral Movements Muscles of Facial Expression: None, normal Lips and Perioral Area: None, normal Jaw: None, normal Tongue: None, normal,Extremity Movements Upper (arms, wrists, hands, fingers): None, normal Lower (legs, knees, ankles, toes): None, normal, Trunk Movements Neck, shoulders, hips: None, normal, Overall Severity Severity of abnormal movements (highest score from questions above): None, normal Incapacitation due to abnormal movements: None, normal Patient's awareness of abnormal movements (rate only patient's report): No Awareness, Dental Status Current problems with teeth and/or  dentures?: No Does patient usually wear dentures?: No  CIWA:  CIWA-Ar Total: 1 COWS:  COWS Total Score: 1  Musculoskeletal: Strength & Muscle Tone: within normal limits Gait & Station: normal Patient leans: N/A  Psychiatric Specialty Exam: Physical Exam  ROS no chest pain, no shortness of breath, no coughing, no rash, no fever, no chills   Blood pressure 123/77, pulse 57, temperature 97.8 F (36.6 C), temperature source Oral, resp. rate 18, height _0  (1.727 m), weight 136 lb (61.689 kg).Body mass index is 20.68 kg/(m^2).  General Appearance: Fairly Groomed  Eye Contact:  Good  Speech:  Normal Rate  Volume:  Decreased  Mood:  partially improved but still depressed   Affect:  constricted, reactive, does smile at times appropriately   Thought Process:  Linear  Orientation:  Full (Time, Place, and Person)  Thought Content:  denies hallucinations, no delusions , not internally preoccupied   Suicidal Thoughts:  No denies suicidal ideations, denies self injurious ideations, denies homicidal ideations   Homicidal Thoughts:  No  Memory:  recent and remote grossly intact   Judgement:  Other:  improving   Insight:   Improving   Psychomotor Activity:  Normal  Concentration:  Concentration: Good and Attention Span: Good  Recall:  Good  Fund of Knowledge:  Good  Language:  Good  Akathisia:  Negative  Handed:  Right  AIMS (if indicated):     Assets:  Desire for Improvement Resilience  ADL's:  Intact  Cognition:  WNL  Sleep:  Number of Hours: 6.25   Assessment - patient presents with partial improvement of mood and range of affect, but still depressed. Denies suicidal ideations at this time. Calm, pleasant on approach. Thus far tolerating Celexa , Depakote ER well . Denies side effects.  Treatment Plan Summary: Daily contact with patient to assess and evaluate symptoms and progress in treatment, Medication management, Plan inpatient treatement  and medications as below  Continue to  encourage group, milieu participation to work on coping skills and symptom reduction Continue Depakote ER 500 mgrs QHS for intermittent explosiveness  Continue Celexa 10 mgrs QDAY for depression Continue Trazodone 50 mgrs QHS PRN for insomnia as needed  Neita Garnet, MD 08/29/2015, 5:26 PM

## 2015-08-29 NOTE — Progress Notes (Addendum)
D:  Patient's self inventory sheet, patient sleeps good, sleep medication is helpful.  Fair appetite, normal energy level, good concentration.  Rated depression 5, hopeless 1, denied anxiety.  Denied withdrawals.  Denied SI.  Denied physical problems.  Denied pain.  Goal is to work on anger.  Plans not to get mad.  Does have discharge plans. A:  Medications administered per MD orders.  Emotional support and encouragement given patient. R:  Denied SI and HI, contracts for safety.  Denied A/V hallucinations.  Denied pain.  Safety maintained with 15 minute checks. (This is progress note on 08/29/2015)

## 2015-08-29 NOTE — Progress Notes (Signed)
Recreation Therapy Notes  Date: 05.29.2017 Time: 9:30am Location: 300 [address] Dayroom   Group Topic: Stress Management  Goal Area(s) Addresses:  Patient will actively participate in stress management techniques presented during session.   Behavioral Response: Did not attend.   Rebeka Kimble L Gearldene Fiorenza, LRT/CTRS        Emmalyne Giacomo L 08/29/2015 2:36 PM 

## 2015-08-29 NOTE — Plan of Care (Signed)
Problem: Education: Goal: Utilization of techniques to improve thought processes will improve Outcome: Progressing Nurse discussed depression/coping skills with patient.    
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
## 2015-08-29 NOTE — Progress Notes (Signed)
Adult Psychoeducational Group Note  Date:  08/29/2015 Time:  8:20 PM  Group Topic/Focus:  Wrap-Up Group:   The focus of this group is to help patients review their daily goal of treatment and discuss progress on daily workbooks.  Participation Level:  Active  Participation Quality:  Appropriate  Affect:  Appropriate  Cognitive:  Appropriate  Insight: Appropriate  Engagement in Group:  Engaged  Modes of Intervention:  Discussion  Additional Comments:  Pt rated his overall day a 10 out of 10 because he got to see his girlfriend today. Pt reported that he did not have a goal for the day.   Cleotilde NeerJasmine S Dorion Petillo 08/29/2015, 8:43 PM

## 2015-08-30 NOTE — Progress Notes (Addendum)
Pt attended spiritual care group on grief and loss facilitated by chaplain Ovila Lepage   Group opened with brief discussion and psycho-social ed around grief and loss in relationships and in relation to self - identifying life patterns, circumstances, changes that cause losses. Established group norm of speaking from own life experience. Group goal of establishing open and affirming space for members to share loss and experience with grief, normalize grief experience and provide psycho social education and grief support.     

## 2015-08-30 NOTE — Progress Notes (Signed)
D: Pt presents with flat affect. Pt calm and cooperative on approach. Pt reported fair sleep and appetite. Pt denies depression, anxiety and SI. Pt denies AVH. Pt reported no anger or irritability this morning. Pt hopeful to discharge home soon. Pt stated that he plans to f/u with a therapist after discharge. Pt have minimal participation with attending groups. Pt compliant with taking meds and no side effects to meds verbalized by pt.  A: Medications administered as ordered per MD. Verbal support provided. Pt encouraged to attend groups. 15 minute checks performed for safety. R: Pt receptive to tx.

## 2015-08-30 NOTE — Progress Notes (Signed)
Patient ID: Jonathan [name], male   DOB: 05-22-1995, 20 y.o.   MRN: [id number] [facility] MD Progress Note  08/30/2015 1:22 PM Jonathan [name]  MRN:  [id number] Subjective:    Patient reports improving mood , decreased depression, and denies any current suicidal ideations . He states he is tolerating medications well at this time , and denies medication side effects at present . States he had a good visit from girlfriend yesterday evening which " helped me feel a lot better " Denies any further ideations of self injurious behaviors or self cutting. Superficial scratches, lacerations on forearm improving, denies pain or any bleeding or drainage  Objective : I have reviewed chart notes and have met with patient.  Patient presents improved compared to admission presentation - mood less depressed, affect more reactive, and currently denying any SI or self injurious ideations. He is future oriented and currently focused on being discharged soon. He denies medication side effects, and feels Depakote ER has helped his history of angry episodes / explosiveness. On unit he has remained calm, polite, no explosiveness has been noted . We have reviewed medication side effect profiles .  Principal Problem: MDD (major depressive disorder), severe ([place]) Diagnosis:   Patient Active Problem List   Diagnosis Date Noted  . MDD (major depressive disorder), severe ([place]) [F32.2] 08/28/2015  . Severe major depression without psychotic features ([place]) [F32.2] 08/27/2015   Total Time spent with patient: 20 minutes    Past Medical History: History reviewed. No pertinent past medical history. History reviewed. No pertinent past surgical history. Family History: History reviewed. No pertinent family history.  Social History:  History  Alcohol Use No    Comment: reports he does not drink     History  Drug Use  . Yes  . Special: Marijuana    Social History   Social History  . Marital Status: Single    Spouse Name:  N/A  . Number of Children: N/A  . Years of Education: N/A   Social History Main Topics  . Smoking status: Current Every Day Smoker -- 0.50 packs/day    Types: Cigarettes  . Smokeless tobacco: None  . Alcohol Use: No     Comment: reports he does not drink  . Drug Use: Yes    Special: Marijuana  . Sexual Activity: Not Asked   Other Topics Concern  . None   Social History Narrative   Additional Social History:   Sleep: improving   Appetite:  improving   Current Medications: Current Facility-Administered Medications  Medication Dose Route Frequency Provider Last Rate Last Dose  . acetaminophen (TYLENOL) tablet 650 mg  650 mg Oral Q6H PRN Lurena Nida, NP      . citalopram (CELEXA) tablet 10 mg  10 mg Oral Daily Lurena Nida, NP   10 mg at 08/30/15 0805  . divalproex (DEPAKOTE ER) 24 hr tablet 500 mg  500 mg Oral QHS Jenne Campus, MD   500 mg at 08/29/15 2102  . ibuprofen (ADVIL,MOTRIN) tablet 600 mg  600 mg Oral Q8H PRN Lurena Nida, NP   600 mg at 08/28/15 2022  . nicotine (NICODERM CQ - dosed in mg/24 hours) patch 21 mg  21 mg Transdermal Daily Lurena Nida, NP   21 mg at 08/29/15 0817  . traZODone (DESYREL) tablet 50 mg  50 mg Oral QHS PRN Lurena Nida, NP   50 mg at 08/29/15 2102    Lab Results:  Results for orders placed  or performed during the hospital encounter of 08/28/15 (from the past 48 hour(s))  TSH     Status: None   Collection Time: 08/29/15  6:08 AM  Result Value Ref Range   TSH 0.555 0.350 - 4.500 uIU/mL    Comment: Performed at [facility]    Blood Alcohol level:  Lab Results  Component Value Date   [facility] <5 08/26/2015   ETH <5 11/23/2014    Physical Findings: AIMS: Facial and Oral Movements Muscles of Facial Expression: None, normal Lips and Perioral Area: None, normal Jaw: None, normal Tongue: None, normal,Extremity Movements Upper (arms, wrists, hands, fingers): None, normal Lower (legs, knees, ankles, toes): None,  normal, Trunk Movements Neck, shoulders, hips: None, normal, Overall Severity Severity of abnormal movements (highest score from questions above): None, normal Incapacitation due to abnormal movements: None, normal Patient's awareness of abnormal movements (rate only patient's report): No Awareness, Dental Status Current problems with teeth and/or dentures?: No Does patient usually wear dentures?: No  CIWA:  CIWA-Ar Total: 1 COWS:  COWS Total Score: 1  Musculoskeletal: Strength & Muscle Tone: within normal limits- no tremors,  Gait & Station: normal Patient leans: N/A  Psychiatric Specialty Exam: Physical Exam  ROS no chest pain, no shortness of breath, no coughing, no rash, no fever, no chills , no rash   Blood pressure 109/57, pulse 69, temperature 97.9 F (36.6 C), temperature source Oral, resp. rate 18, height '5\' 8"'  (1.727 m), weight 136 lb (61.689 kg).Body mass index is 20.68 kg/(m^2).  General Appearance: improved grooming   Eye Contact:  Good  Speech:  Normal Rate  Volume:  Normal   Mood:  Improved compared to admission   Affect:  Less constricted , more reactive   Thought Process:  Linear  Orientation:  Full (Time, Place, and Person)  Thought Content:  denies hallucinations, no delusions , not internally preoccupied   Suicidal Thoughts:  No denies suicidal ideations, denies self injurious ideations, denies homicidal ideations   Homicidal Thoughts:  No  Memory:  recent and remote grossly intact   Judgement:  Other:  improving   Insight:   Improving   Psychomotor Activity:  Normal  Concentration:  Concentration: Good and Attention Span: Good  Recall:  Good  Fund of Knowledge:  Good  Language:  Good  Akathisia:  Negative  Handed:  Right  AIMS (if indicated):     Assets:  Desire for Improvement Resilience  ADL's:  Intact  Cognition:  WNL  Sleep:  Number of Hours: 6.75   Assessment - patient  Reports he is feeling better, and endorses decreased symptoms of  depression, denies any SI. He is currently future oriented and focused on being discharged soon . Had good visit from GF, and states he feels he is supported by her, and this has helped decrease depression, anxiety as well . No explosive or angry outbursts on unit, presents calm, appropriate . Tolerating medications well . No side effects .  Treatment Plan Summary: Daily contact with patient to assess and evaluate symptoms and progress in treatment, Medication management, Plan inpatient treatement  and medications as below  Continue to encourage group, milieu participation to work on coping skills and symptom reduction Continue Depakote ER 500 mgrs QHS for intermittent explosiveness  Continue Celexa 10 mgrs QDAY for depression Continue Trazodone 50 mgrs QHS PRN for insomnia as needed  Obtain Valproic Acid Serum level  Treatment team working on disposition planning  Neita Garnet, MD 08/30/2015, 1:22 PM

## 2015-08-30 NOTE — [facility] Group Notes (Signed)
[facility] LCSW Group Therapy 08/30/2015 1:15 PM  Type of Therapy: Group Therapy- Feelings about Diagnosis  Participation Level: Minimal  Participation Quality:  Reserved  Affect:  Flat  Cognitive: Alert and Oriented   Insight:  Developing   Engagement in Therapy: Limited  Modes of Intervention: Clarification, Confrontation, Discussion, Education, Exploration, Limit-setting, Orientation, Problem-solving, Rapport Building, Reality Testing, Socialization and Support  Description of Group:   This group will allow patients to explore their thoughts and feelings about diagnoses they have received. Patients will be guided to explore their level of understanding and acceptance of these diagnoses. Facilitator will encourage patients to process their thoughts and feelings about the reactions of others to their diagnosis, and will guide patients in identifying ways to discuss their diagnosis with significant others in their lives. This group will be process-oriented, with patients participating in exploration of their own experiences as well as giving and receiving support and challenge from other group members.  Summary of Progress/Problems:  Pt did not participate in group discussion but remained attentive throughout.  Therapeutic Modalities:   Cognitive Behavioral Therapy Solution Focused Therapy Motivational Interviewing Relapse Prevention Therapy  Raydin Bielinski Carter, LCSWA 08/30/2015 3:39 PM 

## 2015-08-30 NOTE — Progress Notes (Signed)
[facility] Group Notes:  (Nursing/MHT/Case Management/Adjunct)  Date:  08/30/2015  Time:  2100 Type of Therapy:  wrap up group  Participation Level:  Active  Participation Quality:  Appropriate, Attentive, Sharing and Supportive  Affect:  Flat  Cognitive:  Appropriate  Insight:  Improving  Engagement in Group:  Engaged  Modes of Intervention:  Clarification, Education and Support  Summary of Progress/Problems: Pt shared that stress and depression brought him in struggling with work, school, and family. Pt shared that he plans to stay on his medication this time and has a supportive girlfriend that was here a year ago that can relate and help him stay on track.   Marcille BuffyMcNeil, Caroll Cunnington S 08/30/2015, 9:39 PM

## 2015-08-30 NOTE — Progress Notes (Signed)
Adult Psychoeducational Group Note  Date:  08/30/2015 Time:   0830  Group Topic/Focus:  Orientation:   The focus of this group is to educate the patient on the purpose and policies of crisis stabilization and provide a format to answer questions about their admission.  The group details unit policies and expectations of patients while admitted.  Participation Level:  Did Not Attend  Participation Quality:    Affect:    Cognitive:    Insight:   Engagement in Group:    Modes of Intervention:    Additional Comments:    Sherriann Szuch L 08/30/2015, 12:55 PM

## 2015-08-31 LAB — VALPROIC ACID LEVEL: VALPROIC ACID LVL: 40 ug/mL — AB (ref 50.0–100.0)

## 2015-08-31 MED ORDER — CITALOPRAM HYDROBROMIDE 10 MG PO TABS: 10.0000 mg | ORAL_TABLET | Freq: Every day | ORAL | Status: DC

## 2015-08-31 MED ORDER — DIVALPROEX SODIUM ER 250 MG PO TB24
750.0000 mg | ORAL_TABLET | Freq: Every day | ORAL | Status: DC
  Filled 2015-08-31 (×2): qty 3

## 2015-08-31 MED ORDER — NICOTINE 21 MG/24HR TD PT24: 21.0000 mg | MEDICATED_PATCH | Freq: Every day | TRANSDERMAL | Status: DC

## 2015-08-31 MED ORDER — TRAZODONE HCL 50 MG PO TABS: 50.0000 mg | ORAL_TABLET | Freq: Every evening | ORAL | Status: DC | PRN

## 2015-08-31 MED ORDER — DIVALPROEX SODIUM ER 250 MG PO TB24: 750.0000 mg | ORAL_TABLET | Freq: Every day | ORAL | Status: DC

## 2015-08-31 MED ORDER — DIVALPROEX SODIUM ER 500 MG PO TB24: 500.0000 mg | ORAL_TABLET | Freq: Every day | ORAL | Status: DC

## 2015-08-31 NOTE — Progress Notes (Signed)
Recreation Therapy Notes  Date: 05.31.2017 Time: 9:30am Location: 300 [address] Group Room   Group Topic: Stress Management  Goal Area(s) Addresses:  Patient will actively participate in stress management techniques presented during session.   Behavioral Response: Did not attend.   Jonathan [name] L Jonathan [name], LRT/CTRS        Jonathan [name] L 08/31/2015 2:06 PM 

## 2015-08-31 NOTE — Progress Notes (Signed)
Pt has been visible on the unit, spending most of this time in the dayroom talking with peers.  He has also been talking on the phone a could of times.  He denies SI/HI/AVH at this time.  He reports he is doing well and feels he may be discharged on Wednesday.  He has been pleasant and cooperative with staff.  He makes his needs known to staff.  He feels the medications are helping and does not c/o any side affects.  Support and encouragement offered.  Discharge plans are in process.  Safety maintained with q15 minute checks.

## 2015-08-31 NOTE — Progress Notes (Signed)
D:  Patient's self inventory sheet, patient has fair sleep, sleep medication helpful.  Good appetite, normal energy level, good concentration.  Denied depression, hopeless and anxiety.  Denied withdrawals.  Denied SI.  Denied physical problems.  Denied pain.  Goal is to work on stress.  Plans not to overwhelm himself.  Does have discharge plans. A:  Medications administered per MD orders.  Emotional support and encouragement given patient. R:  Patient denied SI and HI, contracts for safety.  Denied A/V hallucinations.  Safety maintained with 15 minute  Checks. Patient looking forward to discharge today.

## 2015-08-31 NOTE — [facility] Suicide Risk Assessment (Addendum)
[facility]   Principal Problem: MDD (major depressive disorder), severe [facility](HCC) Discharge Diagnoses:  Patient Active Problem List   Diagnosis Date Noted  . MDD (major depressive disorder), severe (HCC) [F32.2] 08/28/2015  . Severe major depression without psychotic features (HCC) [F32.2] 08/27/2015    Total Time spent with patient: 30 minutes  Musculoskeletal: Strength & Muscle Tone: within normal limits Gait & Station: normal Patient leans: N/A  Psychiatric Specialty Exam: ROS  Blood pressure 110/70, pulse 68, temperature 98.4 F (36.9 C), temperature source Oral, resp. rate 18, height 5\' 8"  (1.727 m), weight 136 lb (61.689 kg).Body mass index is 20.68 kg/(m^2).  General Appearance: improved grooming   Eye Contact::  Good  Speech:  Normal Rate409  Volume:  Normal  Mood:  improved mood and improved range of affect   Affect:  more reactive, smiles at times appropriately   Thought Process:  Linear  Orientation:  Full (Time, Place, and Person)  Thought Content:  denies hallucinations, no delusions , not internally preoccupied   Suicidal Thoughts:  No- denies any suicidal ideations, denies any self injurious ideations, denies any violent or homicidal ideations   Homicidal Thoughts:  No  Memory:  recent and remote grossly intact   Judgement:  Other:  improved   Insight:  improved   Psychomotor Activity:  Normal  Concentration:  Good  Recall:  Good  Fund of Knowledge:Good  Language: Good  Akathisia:  Negative  Handed:  Right  AIMS (if indicated):     Assets:  Desire for Improvement Resilience  Sleep:  Number of Hours: 6.75  Cognition: WNL  ADL's:  Intact   Mental Status Per Nursing Assessment::   On Admission:     Demographic Factors:  20 year old male   Loss Factors: Mother, grandmother have been physically ill, recent argument with GF   Historical Factors: History of depression, 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 Behavior has remained calm, in good control, no anger or outbursts on unit . Denies medication side effects. We have reviewed side effect profiles for SSRI and Depakote , to include  Potential increased risk of suicidal ideations early in treatment with antidepressants in young adults, potential sexual side effects and also risk of liver, hematological side effects on Depakote, risk of tremors on Depakote,  And importance of outpatient psychiatrist/provider for medication management and periodic blood work as on Depakote  Depakote dose increased to 750 mgrs QHS due to slightly subtherapeutic valproic acid serum level (40) and good tolerance, without any side effects at this time    Cognitive Features That Contribute To Risk:  No gross cognitive deficits noted upon discharge. Is alert , attentive, and oriented x 3   Suicide Risk:  Mild:  Suicidal ideation of limited frequency, intensity, duration, and specificity.  There are no identifiable plans, no associated intent, mild dysphoria and related symptoms, good self-control (both objective and subjective assessment), few other risk factors, and identifiable protective factors, including available and accessible social support.  Follow-up Information    [facility]   Specialty:  Behavioral Health   Why:  Please use the walk-in clinic, open  Monday-Friday between 8am-3pm, for your initial assessment for mediation management and therapy.   Contact information:   [address] [place] [address] [phone]       Plan Of Care/Follow-up recommendations:  Activity:  as tolerated  Diet:   regular  Tests:  NA Other:  See below  Patient is leaving unit in good spirits  Plans to return home Follow up as above  Nehemiah Massed, MD 08/31/2015, 12:36 PM

## 2015-08-31 NOTE — Tx Team (Signed)
Interdisciplinary Treatment Plan Update (Adult) Date: 08/31/2015   Date: 08/31/2015 9:28 AM  Progress in Treatment:  Attending groups: Minimally Participating in groups: No  Taking medication as prescribed: Yes  Tolerating medication: Yes  Family/Significant othe contact made: No, Pt declines Patient understands diagnosis: Yes AEB seeking help for depression Discussing patient identified problems/goals with staff: Yes  Medical problems stabilized or resolved: Yes  Denies suicidal/homicidal ideation: Yes Patient has not harmed self or Others: Yes   New problem(s) identified: None identified at this time.   Discharge Plan or Barriers: CSW will assess for appropriate discharge plan and relevant barriers.   08/31/2015: Pt discharging to friend's home and will follow-up with [facility].  Additional comments:  Patient and CSW reviewed pt's identified goals and treatment plan. Patient verbalized understanding and agreed to treatment plan. CSW reviewed [facility] "Discharge Process and Patient Involvement" Form. Pt verbalized understanding of information provided and signed form.   Reason for Continuation of Hospitalization:  Anxiety Depression Medication stabilization Suicidal ideation  Estimated length of stay: 0 days  Review of initial/current patient goals per problem list:   1.  Goal(s): Patient will participate in aftercare plan  Met:  Yes  Target date: 3-5 days from date of admission   As evidenced by: Patient will participate within aftercare plan AEB aftercare provider and housing plan at discharge being identified.  08/29/15: CSW to work with Pt to assess for appropriate discharge plan and faciliate appointments and referrals as needed prior to d/c. 08/31/2015: Pt discharging to friend's home and will follow-up with [facility].  2.  Goal (s): Patient will exhibit decreased depressive symptoms and suicidal ideations.  Met:  Yes  Target date: 3-5 days from date of admission   As  evidenced by: Patient will utilize self rating of depression at 3 or below and demonstrate decreased signs of depression or be deemed stable for discharge by MD. 08/29/15: Pt was admitted with symptoms of depression, rating 10/10. Pt continues to present with flat affect and depressive symptoms.  Pt will demonstrate decreased symptoms of depression and rate depression at 3/10 or lower prior to discharge. 08/31/2015: Pt denies depression and suicidality.  3.  Goal(s): Patient will demonstrate decreased signs and symptoms of anxiety.  Met:  Yes  Target date: 3-5 days from date of admission   As evidenced by: Patient will utilize self rating of anxiety at 3 or below and demonstrated decreased signs of anxiety, or be deemed stable for discharge by MD 08/29/15: Pt was admitted with increased levels of anxiety and is currently rating those symptoms highly. Pt will demonstrated decreased symptoms of anxiety and rate it at 3/10 prior to d/c. 08/31/2015: Pt denies anxiety at this time.  Attendees:  Patient:    Family:    Physician: Dr. Parke Poisson, MD  08/31/2015 9:28 AM  Nursing: Lars Pinks, RN Case manager  08/31/2015 9:28 AM  Clinical Social Worker Peri Maris, [place] 08/31/2015 9:28 AM  Other: Tilden Fossa, LCSWA 08/31/2015 9:28 AM  Clinical:  Desma Paganini, RN; Grayland Ormond, RN; Desma Paganini, RN 08/31/2015 9:28 AM  Other: , RN Charge Nurse 08/31/2015 9:28 AM  Other: Hilda Lias, [place], [place] Work [phone]

## 2015-08-31 NOTE — Discharge Summary (Signed)
Physician Discharge Summary Note  Patient:  Jonathan [name] is an 20 y.o., male MRN:  [id number] DOB:  23-Aug-1995 Patient phone:  [phone] (home)  Patient address:   [address] [place] [address],  Total Time spent with patient: Greater than 30 minutes  Date of Admission:  08/28/2015  Date of Discharge: 08-31-15  Reason for Admission: Worsening symptoms of depression  Principal Problem: MDD (major depressive disorder), severe [facility](HCC)  Discharge Diagnoses: Patient Active Problem List   Diagnosis Date Noted  . MDD (major depressive disorder), severe (HCC) [F32.2] 08/28/2015  . Severe major depression without psychotic features (HCC) [F32.2] 08/27/2015   Past Psychiatric History: Major depression  Past Medical History: History reviewed. No pertinent past medical history. History reviewed. No pertinent past surgical history.  Family History: History reviewed. No pertinent family history.  Family Psychiatric  History: See H&P  Social History:  History  Alcohol Use No    Comment: reports he does not drink     History  Drug Use  . Yes  . Special: Marijuana    Social History   Social History  . Marital Status: Single    Spouse Name: N/A  . Number of Children: N/A  . Years of Education: N/A   Social History Main Topics  . Smoking status: Current Every Day Smoker -- 0.50 packs/day    Types: Cigarettes  . Smokeless tobacco: None  . Alcohol Use: No     Comment: reports he does not drink  . Drug Use: Yes    Special: Marijuana  . Sexual Activity: Not Asked   Other Topics Concern  . None   Social History Narrative   Hospital Course: 20 year old male, who reports history of mood disorder/depression. States he has been feeling depressed, sad, for " a while". States that he has been facing some significant stressors, to include mother and grandmother being physically ill. Two nights ago, in the context of an argument with his GF, he made several self  inflicted lacerations on forearm ( superficial, no sutures ), at which time she called 911 and he was brought to hospital.  Jonathan [name] was admitted to the hospital for worsening symptoms of depression & crisis management due self-inflicted lacerations after an argument with girl-friend. He attributed his worsening depressive symptoms to stressors as the ill-health of his mother & grandmother. He was in need of mood stabilization treatments. After his admission assessment, her presenting symptoms were identified. The medication regimen targeting those symptoms were initiated. He was medicated & discharged on; Citalopram 10 mg for depression, Depakote ER 250 mg for mood stabilization & Trazodone 50 mg for insomnia. He presented no other pre-existing medical problems that required treatment. Jonathan [name] was enrolled & participated in the group counseling sessions being offered & held on this unit. He learned coping skills.  During the course of his hospitalization, Jonathan [name]'s improvement was monitored by observation & his daily report of symptom reduction noted. His emotional & mental status were monitored by daily self-inventory reports completed by [name] & the clinical staff. He was evaluated by the treatment team for mood stability & plans for continued recovery after discharge. Jonathan [name]'s motivation was an integral factor in his mood stability. He was offered further treatment options upon discharge to continue mental health care on outpatient basis.   Upon discharge, Jonathan [name] is both mentally & medically stable. He currently denies suicidal/homicidal ideations, auditory/visual/tactile hallucinations, delusional thoughts or paranoia. He received from the pharmacy, a 7 days worth, supply  samples of his [facility] discharge medications. He left [facility] with all belongings in no apparent distress. Transportation per city bus. [facility] assisted with bus pass.  Physical Findings: AIMS: Facial and Oral Movements Muscles of Facial  Expression: None, normal Lips and Perioral Area: None, normal Jaw: None, normal Tongue: None, normal,Extremity Movements Upper (arms, wrists, hands, fingers): None, normal Lower (legs, knees, ankles, toes): None, normal, Trunk Movements Neck, shoulders, hips: None, normal, Overall Severity Severity of abnormal movements (highest score from questions above): None, normal Incapacitation due to abnormal movements: None, normal Patient's awareness of abnormal movements (rate only patient's report): No Awareness, Dental Status Current problems with teeth and/or dentures?: No Does patient usually wear dentures?: No  CIWA:  CIWA-Ar Total: 1 COWS:  COWS Total Score: 1  Musculoskeletal: Strength & Muscle Tone: within normal limits Gait & Station: normal Patient leans: N/A  Psychiatric Specialty Exam: Physical Exam  Constitutional: He is oriented to person, place, and time. He appears well-developed and well-nourished.  HENT:  Head: Normocephalic.  Eyes: Pupils are equal, round, and reactive to light.  Neck: Normal range of motion.  Cardiovascular: Normal rate.   Respiratory: Effort normal.  GI: Soft.  Genitourinary:  Denies any issues in this area  Musculoskeletal: Normal range of motion.  Neurological: He is alert and oriented to person, place, and time.  Skin: Skin is warm and dry.  Psychiatric: His speech is normal and behavior is normal. Judgment and thought content normal. His mood appears not anxious. His affect is not angry, not blunt, not labile and not inappropriate. Cognition and memory are normal. He does not exhibit a depressed mood.    Review of Systems  Constitutional: Negative.   HENT: Negative.   Eyes: Negative.   Respiratory: Negative.   Cardiovascular: Negative.   Gastrointestinal: Negative.   Genitourinary: Negative.   Musculoskeletal: Negative.   Skin: Negative.   Neurological: Negative.   Endo/Heme/Allergies: Negative.   Psychiatric/Behavioral: Positive  for depression (Stable). Negative for suicidal ideas, hallucinations, memory loss and substance abuse. The patient has insomnia (Stable). The patient is not nervous/anxious.     Blood pressure 110/70, pulse 68, temperature 98.4 F (36.9 C), temperature source Oral, resp. rate 18, height 5\' 8"  (1.727 m), weight 61.689 kg (136 lb).Body mass index is 20.68 kg/(m^2).  See Md's SRA   Have you used any form of tobacco in the last 30 days? (Cigarettes, Smokeless Tobacco, Cigars, and/or Pipes): Yes  Has this patient used any form of tobacco in the last 30 days? (Cigarettes, Smokeless Tobacco, Cigars, and/or Pipes) Yes, Yes, A prescription for an FDA-approved tobacco cessation medication was offered at discharge and the patient refused  Blood Alcohol level:  Lab Results  Component Value Date   ETH <5 08/26/2015   ETH <5 11/23/2014   Metabolic Disorder Labs:  No results found for: HGBA1C, MPG No results found for: PROLACTIN No results found for: CHOL, TRIG, HDL, CHOLHDL, VLDL, LDLCALC  See Psychiatric Specialty Exam and Suicide Risk Assessment completed by Attending Physician prior to discharge.  Discharge destination:  Home  Is patient on multiple antipsychotic therapies at discharge:  No   Has Patient had three or more failed trials of antipsychotic monotherapy by history:  No  Recommended Plan for Multiple Antipsychotic Therapies: NA    Medication List    STOP taking these medications        cyclobenzaprine 10 MG tablet  Commonly known as:  FLEXERIL     naproxen 500 MG tablet  Commonly known  as:  NAPROSYN      TAKE these medications      Indication   citalopram 10 MG tablet  Commonly known as:  CELEXA  Take 1 tablet (10 mg total) by mouth daily. For depression   Indication:  Depression     divalproex 500 MG 24 hr tablet  Commonly known as:  DEPAKOTE ER  Take 1 tablet (500 mg total) by mouth at bedtime. For mood stabilization   Indication:  Mood stabilization      nicotine 21 mg/24hr patch  Commonly known as:  NICODERM CQ - dosed in mg/24 hours  Place 1 patch (21 mg total) onto the skin daily. For smoking cessation   Indication:  Nicotine Addiction     traZODone 50 MG tablet  Commonly known as:  DESYREL  Take 1 tablet (50 mg total) by mouth at bedtime as needed for sleep.   Indication:  Trouble Sleeping       Follow-up Information    Follow up with [facility].   Specialty:  Behavioral Health   Why:  Please use the walk-in clinic, open Monday-Friday between 8am-3pm, for your initial assessment for mediation management and therapy.   [address] [place] [address] [phone]      Follow-up recommendations:  Activity:  As tolerated Diet: As recommended by your primary care doctor. Keep all scheduled follow-up appointments as recommended.  Comments: Patient is instructed prior to discharge to: Take all medications as prescribed by his/her mental healthcare provider. Report any adverse effects and or reactions from the medicines to his/her outpatient provider promptly. Patient has been instructed & cautioned: To not engage in alcohol and or illegal drug use while on prescription medicines. In the event of worsening symptoms, patient is instructed to call the crisis hotline, 911 and or go to the nearest ED for appropriate evaluation and treatment of symptoms. To follow-up with his/her primary care provider for your other medical issues, concerns and or health care needs.   Signed: Sanjuana Kava, NP, PMHNP, FNP-BC 08/31/2015, 11:15 AM   Patient seen, Suicide Assessment Completed.  Disposition Plan Reviewed

## 2015-08-31 NOTE — Progress Notes (Signed)
  [facility] :  Will you be returning to the same living situation after discharge:  Yes,  pt returning home with family At discharge, do you have transportation home?: Yes,  pt provided with bus pass Do you have the ability to pay for your medications: Yes,  Pt provided with samples and prescriptions  Release of information consent forms completed and in the chart;  Patient's signature needed at discharge.  Patient to Follow up at: Follow-up Information    [facility]   Specialty:  Behavioral Health   Why:  Please use the walk-in clinic, open Monday-Friday between 8am-3pm, for your initial assessment for mediation management and therapy.   Contact information:   [address] [place] [address] [phone]       Next level of care provider [facility] to [facility] Link:no  Safety Planning and Suicide Prevention discussed: Yes,  with Pt; declines family contact  Have you used any form of tobacco in the last 30 days? (Cigarettes, Smokeless Tobacco, Cigars, and/or Pipes): Yes  Has patient been referred to the Quitline?: Yes, faxed on 08/31/15  Patient has been referred for addiction treatment: Yes  Elaina HoopsCarter, Merrit Waugh M 08/31/2015, 9:31 AM

## 2015-08-31 NOTE — Progress Notes (Signed)
Discharge Note:  Patient discharged with bus ticket.  Patient denied SI and HI. Denied A/V hallucinations.  Denied pain.  Suicide prevention information given and reviewed with patient who stated he understood and had no questions.  Patient stated he received all his belongings, phone and charger, lighter, wallet, DL, debit card, shorts, clothing, toiletries, misc items, etc.  Patient stated he appreciated all assistance received [facility].  All required discharge information given to patient at discharge.

## 2015-08-31 NOTE — [facility] Group Notes (Signed)
[facility]  08/31/2015 8:45 AM  Pt did not attend, declined invitation.   Chad CordialLauren Carter, LCSWA 08/31/2015 9:27 AM

## 2015-09-14 ENCOUNTER — Encounter (HOSPITAL_COMMUNITY): Payer: Self-pay | Admitting: *Deleted

## 2015-09-14 ENCOUNTER — Emergency Department (HOSPITAL_COMMUNITY): Payer: Self-pay

## 2015-09-14 ENCOUNTER — Emergency Department (HOSPITAL_COMMUNITY)
Admission: EM | Admit: 2015-09-14 | Discharge: 2015-09-15 | Disposition: A | Discharge status: Home / Self Care | Payer: Self-pay | Attending: Emergency Medicine | Admitting: Emergency Medicine

## 2015-09-14 DIAGNOSIS — R791 Abnormal coagulation profile: Secondary | ICD-10-CM | POA: Insufficient documentation

## 2015-09-14 DIAGNOSIS — Y999 Unspecified external cause status: Secondary | ICD-10-CM | POA: Insufficient documentation

## 2015-09-14 DIAGNOSIS — Y929 Unspecified place or not applicable: Secondary | ICD-10-CM | POA: Insufficient documentation

## 2015-09-14 DIAGNOSIS — F17200 Nicotine dependence, unspecified, uncomplicated: Secondary | ICD-10-CM | POA: Insufficient documentation

## 2015-09-14 DIAGNOSIS — Y939 Activity, unspecified: Secondary | ICD-10-CM | POA: Insufficient documentation

## 2015-09-14 DIAGNOSIS — S41111A Laceration without foreign body of right upper arm, initial encounter: Secondary | ICD-10-CM

## 2015-09-14 HISTORY — DX: Bipolar disorder, unspecified: F31.9

## 2015-09-14 LAB — COMPREHENSIVE METABOLIC PANEL
ALK PHOS: 38 U/L (ref 38–126)
ALT: 18 U/L (ref 17–63)
ANION GAP: 13 (ref 5–15)
AST: 28 U/L (ref 15–41)
Albumin: 4.1 g/dL (ref 3.5–5.0)
BUN: 17 mg/dL (ref 6–20)
CHLORIDE: 102 mmol/L (ref 101–111)
CO2: 21 mmol/L — AB (ref 22–32)
Calcium: 9.3 mg/dL (ref 8.9–10.3)
Creatinine, Ser: 1.33 mg/dL — ABNORMAL HIGH (ref 0.61–1.24)
GFR calc Af Amer: >60 mL/min (ref >60)
GFR calc non Af Amer: >60 mL/min (ref >60)
GLUCOSE: 119 mg/dL — AB (ref 65–99)
POTASSIUM: 3.2 mmol/L — AB (ref 3.5–5.1)
SODIUM: 136 mmol/L (ref 135–145)
TOTAL PROTEIN: 7.3 g/dL (ref 6.5–8.1)
Total Bilirubin: 0.7 mg/dL (ref 0.3–1.2)

## 2015-09-14 LAB — CBC
HEMATOCRIT: 43.4 % (ref 39.0–52.0)
HEMOGLOBIN: 14.8 g/dL (ref 13.0–17.0)
MCH: 30 pg (ref 26.0–34.0)
MCHC: 34.1 g/dL (ref 30.0–36.0)
MCV: 88 fL (ref 78.0–100.0)
Platelets: 208 10*3/uL (ref 150–400)
RBC: 4.93 MIL/uL (ref 4.22–5.81)
RDW: 12.7 % (ref 11.5–15.5)
WBC: 11.2 10*3/uL — ABNORMAL HIGH (ref 4.0–10.5)

## 2015-09-14 LAB — PREPARE FRESH FROZEN PLASMA
UNIT DIVISION: 0
UNIT DIVISION: 0

## 2015-09-14 LAB — I-STAT CHEM 8, ED
BUN: 19 mg/dL (ref 6–20)
CALCIUM ION: 1.07 mmol/L — AB (ref 1.12–1.23)
CHLORIDE: 100 mmol/L — AB (ref 101–111)
Creatinine, Ser: 1.4 mg/dL — ABNORMAL HIGH (ref 0.61–1.24)
GLUCOSE: 122 mg/dL — AB (ref 65–99)
HCT: 47 % (ref 39.0–52.0)
Hemoglobin: 16 g/dL (ref 13.0–17.0)
POTASSIUM: 3.2 mmol/L — AB (ref 3.5–5.1)
Sodium: 139 mmol/L (ref 135–145)
TCO2: 22 mmol/L (ref 0–100)

## 2015-09-14 LAB — URINALYSIS, ROUTINE W REFLEX MICROSCOPIC
Bilirubin Urine: NEGATIVE
Glucose, UA: NEGATIVE mg/dL
HGB URINE DIPSTICK: NEGATIVE
Ketones, ur: NEGATIVE mg/dL
Leukocytes, UA: NEGATIVE
Nitrite: NEGATIVE
PH: 6.5 (ref 5.0–8.0)
Protein, ur: NEGATIVE mg/dL
SPECIFIC GRAVITY, URINE: 1.014 (ref 1.005–1.030)

## 2015-09-14 LAB — ETHANOL: Alcohol, Ethyl (B): <5 mg/dL (ref <5)

## 2015-09-14 LAB — PROTIME-INR
INR: 1.15 (ref 0.00–1.49)
Prothrombin Time: 14.8 seconds (ref 11.6–15.2)

## 2015-09-14 LAB — I-STAT CG4 LACTIC ACID, ED
LACTIC ACID, VENOUS: 2.8 mmol/L — AB (ref 0.5–2.0)
LACTIC ACID, VENOUS: 1.2 mmol/L (ref 0.5–2.0)

## 2015-09-14 LAB — ABO/RH: ABO/RH(D): B POS

## 2015-09-14 MED ORDER — SODIUM CHLORIDE 0.9 % IV BOLUS (SEPSIS)
1000.0000 mL | Freq: Once | INTRAVENOUS | Status: AC
  Administered 2015-09-14: 1000 mL via INTRAVENOUS

## 2015-09-14 MED ORDER — MORPHINE SULFATE (PF) 2 MG/ML IV SOLN
INTRAVENOUS | Status: AC
  Administered 2015-09-14: 4 mg via INTRAVENOUS
  Filled 2015-09-14: qty 2

## 2015-09-14 MED ORDER — MORPHINE SULFATE (PF) 4 MG/ML IV SOLN
4.0000 mg | Freq: Once | INTRAVENOUS | Status: AC
  Administered 2015-09-14: 4 mg via INTRAVENOUS

## 2015-09-14 MED ORDER — MORPHINE SULFATE (PF) 4 MG/ML IV SOLN
4.0000 mg | Freq: Once | INTRAVENOUS | Status: DC
  Filled 2015-09-14: qty 1

## 2015-09-14 MED ORDER — LIDOCAINE-EPINEPHRINE 1 %-1:100000 IJ SOLN
10.0000 mL | Freq: Once | INTRAMUSCULAR | Status: AC
  Administered 2015-09-14: 10 mL
  Filled 2015-09-14: qty 1

## 2015-09-14 MED ORDER — OXYCODONE-ACETAMINOPHEN 5-325 MG PO TABS
2.0000 | ORAL_TABLET | Freq: Once | ORAL | Status: AC
  Administered 2015-09-14: 2 via ORAL
  Filled 2015-09-14: qty 2
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
## 2015-09-14 NOTE — Progress Notes (Signed)
Orthopedic Tech Progress Note Patient Details:  Jonathan [name] October 27, 1995 [id number] Level 1 trauma ortho visit. Patient ID: Jonathan [name], male   DOB: October 27, 1995, 20 y.o.   MRN: [id number]   Jennye MoccasinHughes, Azure Barrales Craig 09/14/2015, 7:53 PM
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
## 2015-09-15 ENCOUNTER — Encounter (HOSPITAL_COMMUNITY): Payer: Self-pay | Admitting: *Deleted

## 2015-09-15 LAB — TYPE AND SCREEN
ABO/RH(D): B POS
ANTIBODY SCREEN: NEGATIVE
UNIT DIVISION: 0
Unit division: 0

## 2015-09-15 LAB — CDS SEROLOGY

## 2015-09-15 MED ORDER — OXYCODONE-ACETAMINOPHEN 5-325 MG PO TABS: 1.0000 | ORAL_TABLET | Freq: Four times a day (QID) | ORAL | Status: DC | PRN

## 2015-09-15 MED ORDER — CEPHALEXIN 500 MG PO CAPS: 500.0000 mg | ORAL_CAPSULE | Freq: Two times a day (BID) | ORAL | Status: DC

## 2015-09-15 NOTE — ED Notes (Signed)
MD at bedside. 

## 2015-09-15 NOTE — Discharge Instructions (Signed)
1. Keep wound clean and dry. Apply bacitracin/neosporin once daily. 2. Sutures will need removal in 7-10 days. 3. Take antibiotics as directed until they are finished. 4. Seek immediate medical attention for redness, drainage, or increased pain from wound. 5. Follow up with Dr. Eulah PontMurphy on Friday or Monday (2 to 4 days)   Laceration Care, Adult A laceration is a cut that goes through all of the layers of the skin and into the tissue that is right under the skin. Some lacerations heal on their own. Others need to be closed with stitches (sutures), staples, skin adhesive strips, or skin glue. Proper laceration care minimizes the risk of infection and helps the laceration to heal better. HOW TO CARE FOR YOUR LACERATION If sutures or staples were used:  Keep the wound clean and dry.  If you were given a bandage (dressing), you should change it at least one time per day or as told by your health care provider. You should also change it if it becomes wet or dirty.  Keep the wound completely dry for the first 24 hours or as told by your health care provider. After that time, you may shower or bathe. However, make sure that the wound is not soaked in water until after the sutures or staples have been removed.  Clean the wound one time each day or as told by your health care provider:  Wash the wound with soap and water.  Rinse the wound with water to remove all soap.  Pat the wound dry with a clean towel. Do not rub the wound.  After cleaning the wound, apply a thin layer of antibiotic ointmentas told by your health care provider. This will help to prevent infection and keep the dressing from sticking to the wound.  Have the sutures or staples removed as told by your health care provider. If skin adhesive strips were used:  Keep the wound clean and dry.  If you were given a bandage (dressing), you should change it at least one time per day or as told by your health care provider. You should  also change it if it becomes dirty or wet.  Do not get the skin adhesive strips wet. You may shower or bathe, but be careful to keep the wound dry.  If the wound gets wet, pat it dry with a clean towel. Do not rub the wound.  Skin adhesive strips fall off on their own. You may trim the strips as the wound heals. Do not remove skin adhesive strips that are still stuck to the wound. They will fall off in time. If skin glue was used:  Try to keep the wound dry, but you may briefly wet it in the shower or bath. Do not soak the wound in water, such as by swimming.  After you have showered or bathed, gently pat the wound dry with a clean towel. Do not rub the wound.  Do not do any activities that will make you sweat heavily until the skin glue has fallen off on its own.  Do not apply liquid, cream, or ointment medicine to the wound while the skin glue is in place. Using those may loosen the film before the wound has healed.  If you were given a bandage (dressing), you should change it at least one time per day or as told by your health care provider. You should also change it if it becomes dirty or wet.  If a dressing is placed over the wound, be  careful not to apply tape directly over the skin glue. Doing that may cause the glue to be pulled off before the wound has healed.  Do not pick at the glue. The skin glue usually remains in place for 5-10 days, then it falls off of the skin. General Instructions  Take over-the-counter and prescription medicines only as told by your health care provider.  If you were prescribed an antibiotic medicine or ointment, take or apply it as told by your doctor. Do not stop using it even if your condition improves.  To help prevent scarring, make sure to cover your wound with sunscreen whenever you are outside after stitches are removed, after adhesive strips are removed, or when glue remains in place and the wound is healed. Make sure to wear a sunscreen of at  least 30 SPF.  Do not scratch or pick at the wound.  Keep all follow-up visits as told by your health care provider. This is important.  Check your wound every day for signs of infection. Watch for:  Redness, swelling, or pain.  Fluid, blood, or pus.  Raise (elevate) the injured area above the level of your heart while you are sitting or lying down, if possible. SEEK MEDICAL CARE IF:  You received a tetanus shot and you have swelling, severe pain, redness, or bleeding at the injection site.  You have a fever.  A wound that was closed breaks open.  You notice a bad smell coming from your wound or your dressing.  You notice something coming out of the wound, such as wood or glass.  Your pain is not controlled with medicine.  You have increased redness, swelling, or pain at the site of your wound.  You have fluid, blood, or pus coming from your wound.  You notice a change in the color of your skin near your wound.  You need to change the dressing frequently due to fluid, blood, or pus draining from the wound.  You develop a new rash.  You develop numbness around the wound. SEEK IMMEDIATE MEDICAL CARE IF:  You develop severe swelling around the wound.  Your pain suddenly increases and is severe.  You develop painful lumps near the wound or on skin that is anywhere on your body.  You have a red streak going away from your wound.  The wound is on your hand or foot and you cannot properly move a finger or toe.  The wound is on your hand or foot and you notice that your fingers or toes look pale or bluish.   This information is not intended to replace advice given to you by your health care provider. Make sure you discuss any questions you have with your health care provider.   Document Released: 03/19/2005 Document Revised: 08/03/2014 Document Reviewed: 03/15/2014 Elsevier Interactive Patient Education [employer]

## 2015-10-01 IMAGING — CR DG CHEST 2V
2 series · 2 of 2 positions shown · non-contrast
Comparison: None.

CLINICAL DATA: One day history of cough and fever

EXAM:
CHEST  2 VIEW

[chest pa]
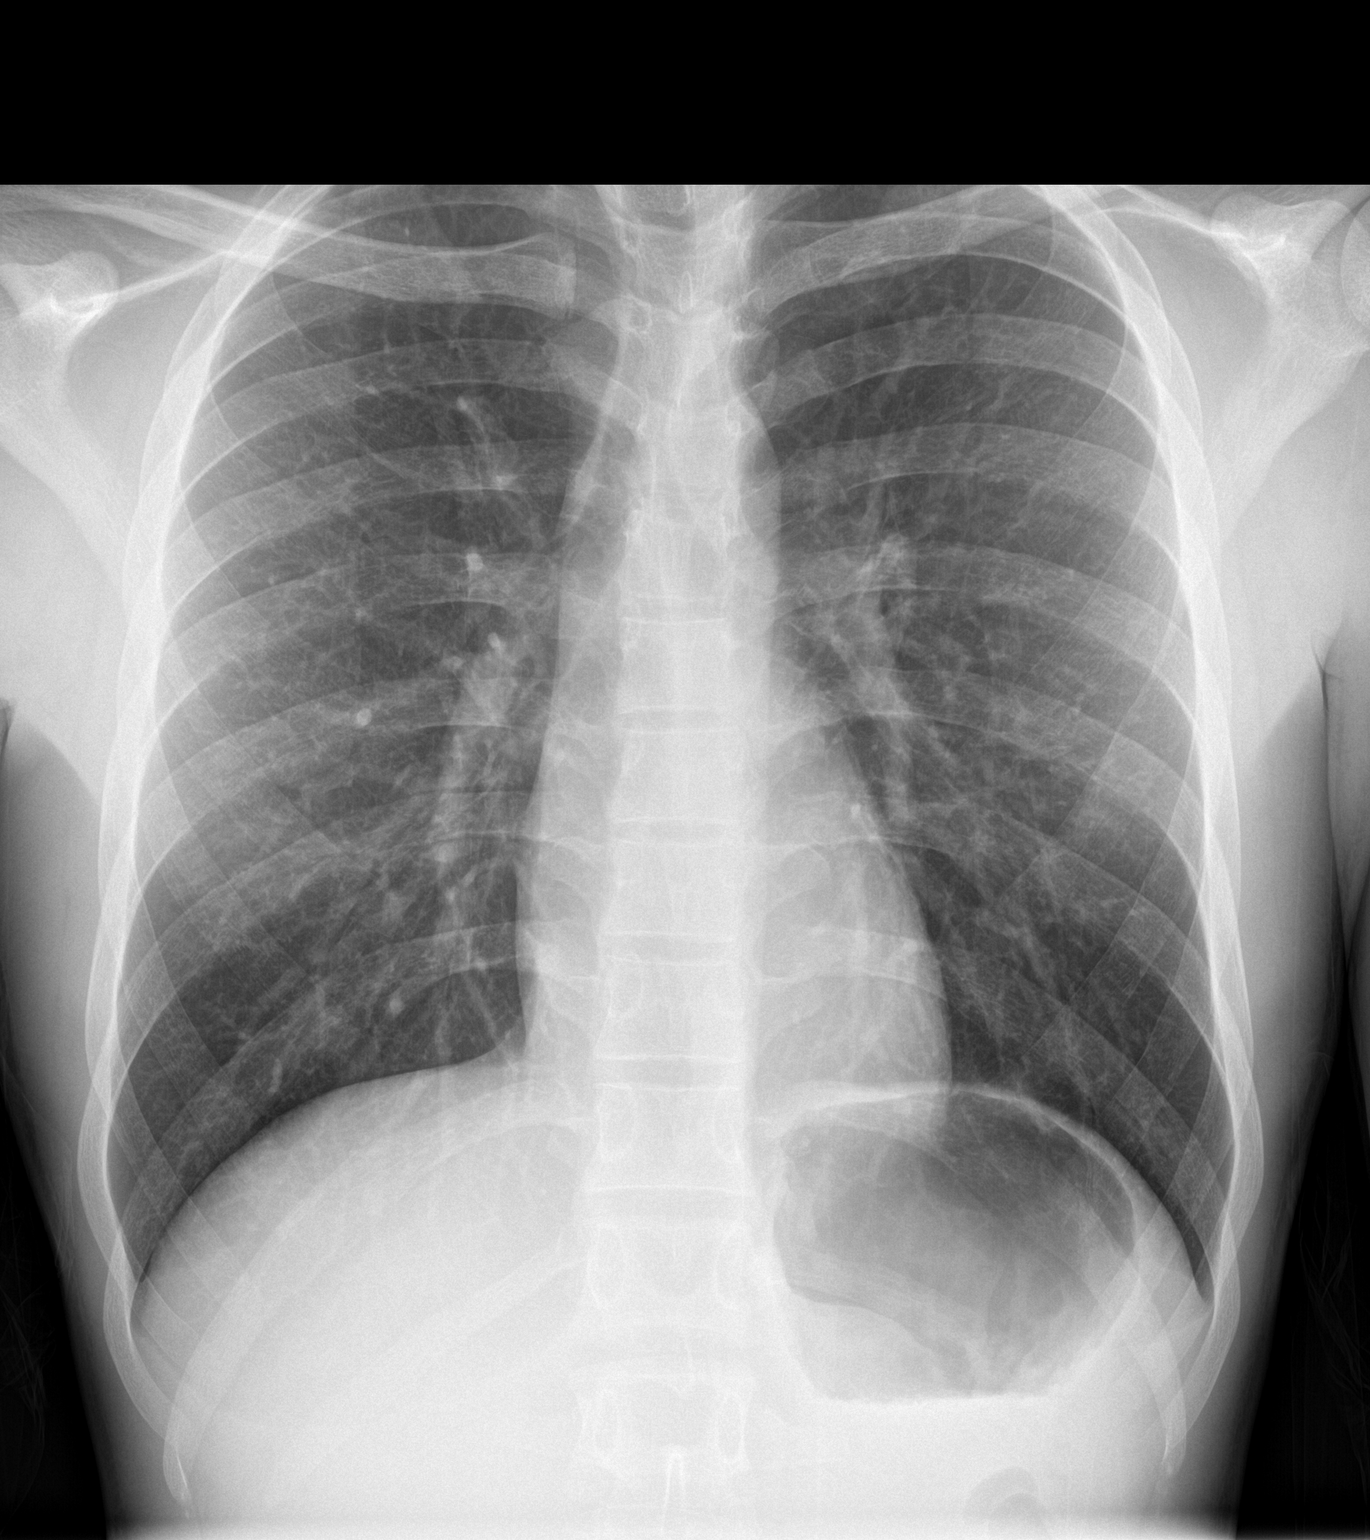

[chest lat]
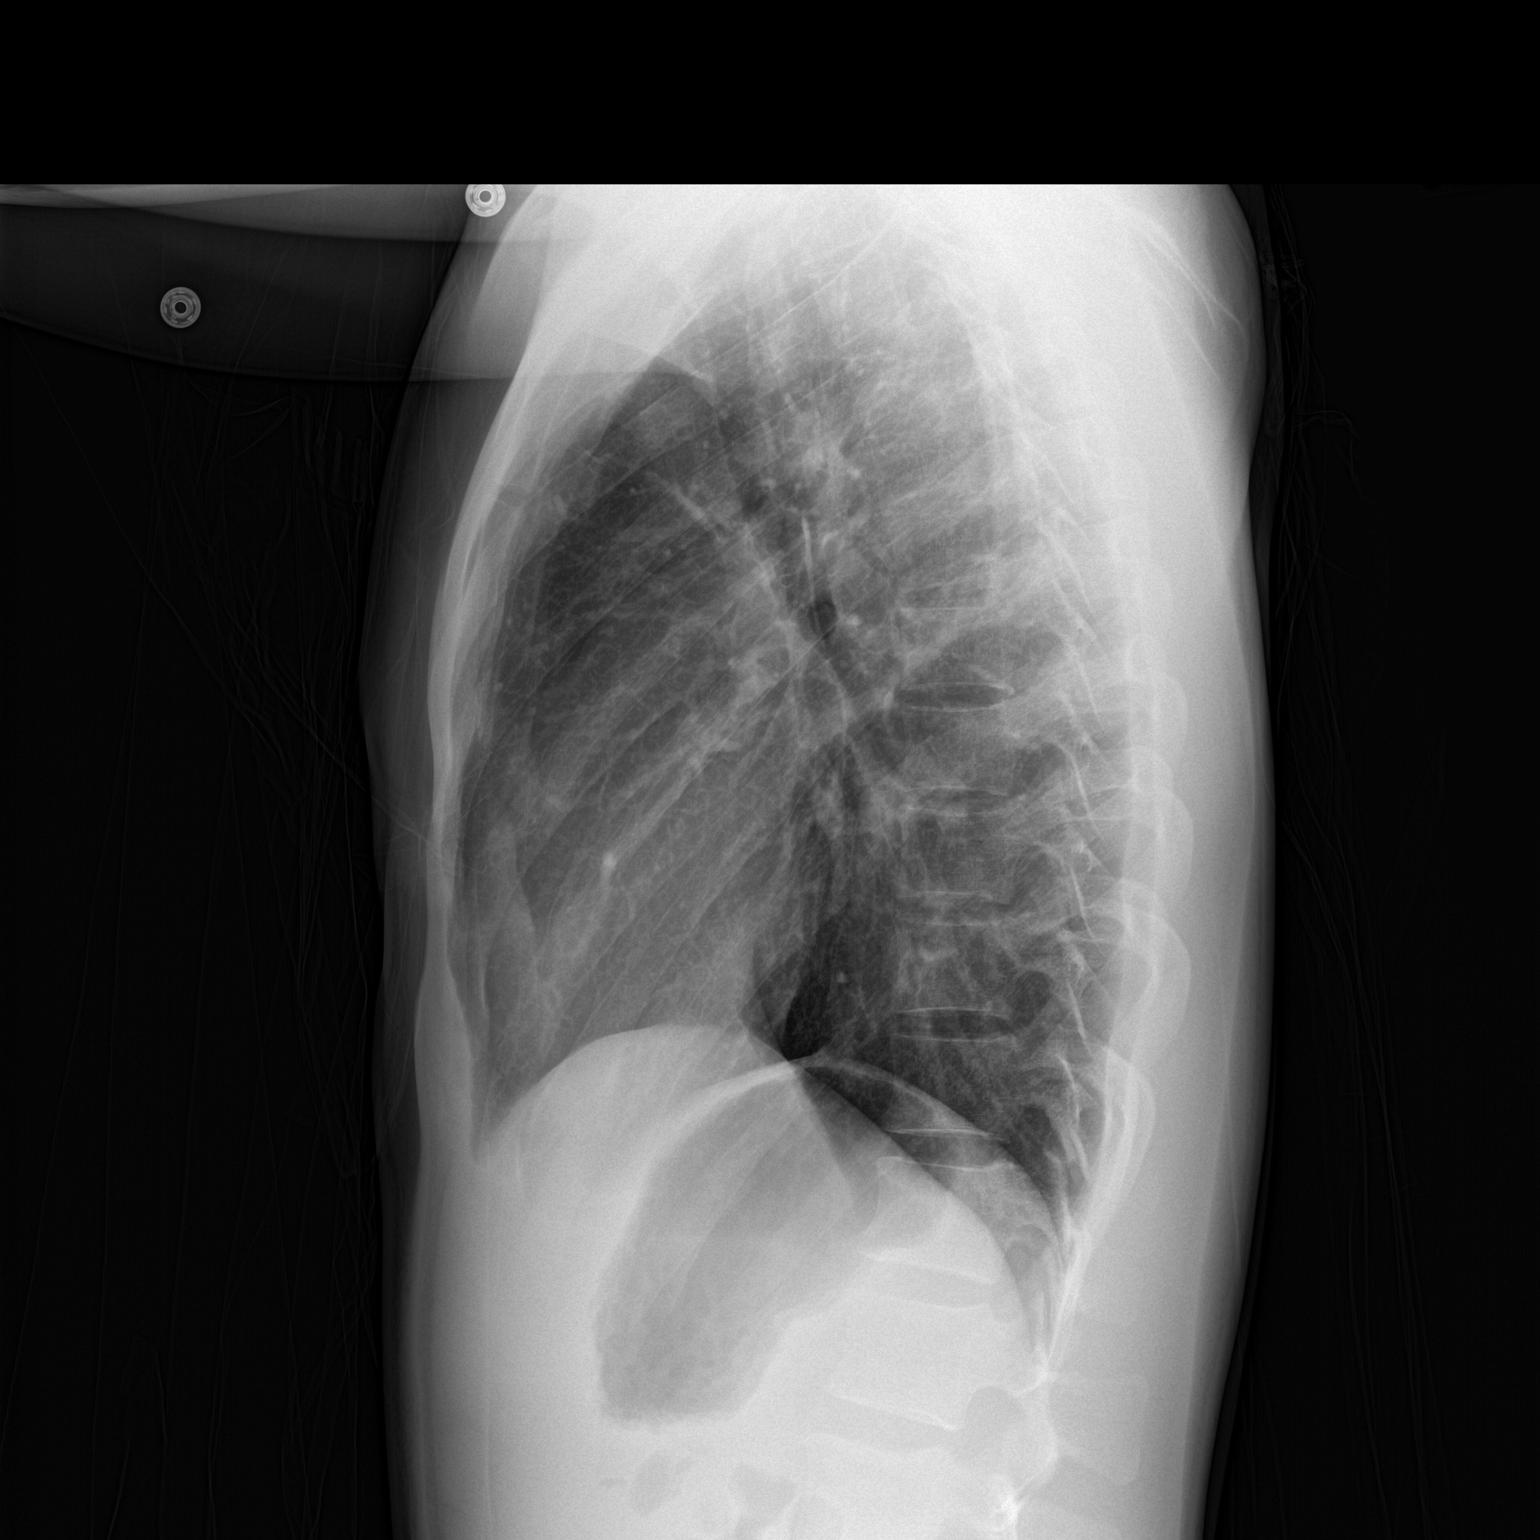

[2 of 2 positions shown; findings below may reference images not displayed]

FINDINGS: Lungs are clear. The heart size and pulmonary vascularity are
normal. No adenopathy. No bone lesions.
IMPRESSION: No edema or consolidation.

## 2016-04-18 ENCOUNTER — Emergency Department (HOSPITAL_COMMUNITY)

## 2016-04-18 ENCOUNTER — Encounter (HOSPITAL_COMMUNITY): Payer: Self-pay | Admitting: Emergency Medicine

## 2016-04-18 ENCOUNTER — Emergency Department (HOSPITAL_COMMUNITY)
Admission: EM | Admit: 2016-04-18 | Discharge: 2016-04-18 | Disposition: A | Discharge status: Home / Self Care | Attending: Emergency Medicine | Admitting: Emergency Medicine

## 2016-04-18 DIAGNOSIS — M25562 Pain in left knee: Secondary | ICD-10-CM

## 2016-04-18 DIAGNOSIS — Z79899 Other long term (current) drug therapy: Secondary | ICD-10-CM | POA: Insufficient documentation

## 2016-04-18 DIAGNOSIS — F17210 Nicotine dependence, cigarettes, uncomplicated: Secondary | ICD-10-CM | POA: Insufficient documentation

## 2016-04-18 MED ORDER — IBUPROFEN 800 MG PO TABS: 800.0000 mg | ORAL_TABLET | Freq: Three times a day (TID) | ORAL | 0 refills | Status: DC
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
## 2016-04-18 NOTE — ED Notes (Signed)
Pt returned from xray

## 2016-04-27 ENCOUNTER — Emergency Department (HOSPITAL_COMMUNITY)
Admission: EM | Admit: 2016-04-27 | Discharge: 2016-04-27 | Disposition: A | Discharge status: Home / Self Care | Attending: Emergency Medicine | Admitting: Emergency Medicine

## 2016-04-27 ENCOUNTER — Encounter (HOSPITAL_COMMUNITY): Payer: Self-pay | Admitting: Emergency Medicine

## 2016-04-27 DIAGNOSIS — F17210 Nicotine dependence, cigarettes, uncomplicated: Secondary | ICD-10-CM | POA: Insufficient documentation

## 2016-04-27 DIAGNOSIS — R509 Fever, unspecified: Secondary | ICD-10-CM | POA: Insufficient documentation

## 2016-04-27 DIAGNOSIS — R6889 Other general symptoms and signs: Secondary | ICD-10-CM

## 2016-04-27 DIAGNOSIS — R05 Cough: Secondary | ICD-10-CM | POA: Insufficient documentation

## 2016-04-27 DIAGNOSIS — R51 Headache: Secondary | ICD-10-CM | POA: Insufficient documentation

## 2016-04-27 MED ORDER — IBUPROFEN 800 MG PO TABS: 800.0000 mg | ORAL_TABLET | Freq: Three times a day (TID) | ORAL | 0 refills | Status: DC

## 2016-04-27 MED ORDER — BENZONATATE 100 MG PO CAPS: 100.0000 mg | ORAL_CAPSULE | Freq: Three times a day (TID) | ORAL | 0 refills | Status: DC

## 2016-04-27 MED ORDER — OSELTAMIVIR PHOSPHATE 75 MG PO CAPS: 75.0000 mg | ORAL_CAPSULE | Freq: Two times a day (BID) | ORAL | 0 refills | Status: DC

## 2016-04-27 MED ORDER — ONDANSETRON HCL 4 MG PO TABS: 4.0000 mg | ORAL_TABLET | Freq: Four times a day (QID) | ORAL | 0 refills | Status: DC

## 2016-04-27 MED ORDER — IBUPROFEN 800 MG PO TABS
800.0000 mg | ORAL_TABLET | Freq: Once | ORAL | Status: AC
  Administered 2016-04-27: 800 mg via ORAL
  Filled 2016-04-27: qty 1

## 2016-04-27 NOTE — Discharge Instructions (Signed)
Medications: Tamiflu, ibuprofen, Tessalon, Zofran  Treatment: Take Tamiflu twice daily for 5 days. Take ibuprofen every 8 hours as needed for fever and bodyaches. You can alternate with Tylenol in between every 4-6 hours, as prescribed over-the-counter. Take Zofran every 6 hours as needed for nausea and vomiting. Take Tessalon every 8 hours as needed for cough. Make sure to drink plenty of fluids and get plenty of rest.  Follow-up: Please return to emergency department if you develop any new or worsening symptoms, such as difficulty breathing, feeling like you're going to pass out, intractable vomiting, or inability to keep down fluids and/or medicine. Please follow-up with your primary care provider if your symptoms are not improving over the next week.

## 2016-04-27 NOTE — ED Triage Notes (Signed)
Pt comes from home via EMS with complaints of flu like symptoms.  Endorses generalized body aches and congestion. Denies N&V. States he has not slept in 2 days.

## 2016-04-27 NOTE — ED Provider Notes (Signed)
[facility]-EMERGENCY DEPT Provider Note   CSN: [id number] Arrival date & time: 04/27/16  0046     History   Chief Complaint Chief Complaint  Patient presents with  . Flu Like Symptoms    HPI Jonathan [name] is a 21 y.o. male with history of bipolar 1 disorder and major depressive disorder who presents with a one-day history of subjective fever, cough, headache, sore throat, myalgias. Patient reports he began yesterday with his symptoms. Patient reports he has not been able to sleep the past 2 nights due to his symptoms. Patient reports aching his chest and back and "all over." Patient had one episode of vomiting yesterday after eating. Patient has otherwise been able to tolerate fluids. He denies any shortness of breath, diarrhea, or urinary symptoms. Patient denies any recent long trips, surgeries, cancer, or new leg pain or swelling.  HPI  Past Medical History:  Diagnosis Date  . Bipolar 1 disorder [facility])     Patient Active Problem List   Diagnosis Date Noted  . MDD (major depressive disorder), severe (HCC) 08/28/2015  . Severe major depression without psychotic features (HCC) 08/27/2015    History reviewed. No pertinent surgical history.     Home Medications    Prior to Admission medications   Medication Sig Start Date End Date Taking? Authorizing Provider  benzonatate (TESSALON) 100 MG capsule Take 1 capsule (100 mg total) by mouth every 8 (eight) hours. 04/27/16   Emi Holes, PA-C  cephALEXin (KEFLEX) 500 MG capsule Take 1 capsule (500 mg total) by mouth 2 (two) times daily. 09/15/15   Laurence Spates, MD  citalopram (CELEXA) 10 MG tablet Take 1 tablet (10 mg total) by mouth daily. For depression 08/31/15   Sanjuana Kava, NP  citalopram (CELEXA) 20 MG tablet Take 20 mg by mouth daily.    Historical Provider, MD  divalproex (DEPAKOTE ER) 250 MG 24 hr tablet Take 3 tablets (750 mg total) by mouth at bedtime. 08/31/15   Adonis Brook, NP  divalproex (DEPAKOTE) 250 MG DR  tablet Take 750 mg by mouth daily.    Historical Provider, MD  ibuprofen (ADVIL,MOTRIN) 800 MG tablet Take 1 tablet (800 mg total) by mouth 3 (three) times daily. 04/27/16   Emi Holes, PA-C  nicotine (NICODERM CQ - DOSED IN MG/24 HOURS) 21 mg/24hr patch Place 1 patch (21 mg total) onto the skin daily. For smoking cessation 08/31/15   Sanjuana Kava, NP  ondansetron (ZOFRAN) 4 MG tablet Take 1 tablet (4 mg total) by mouth every 6 (six) hours. 04/27/16   Emi Holes, PA-C  oseltamivir (TAMIFLU) 75 MG capsule Take 1 capsule (75 mg total) by mouth every 12 (twelve) hours. 04/27/16   Emi Holes, PA-C  oxyCODONE-acetaminophen (PERCOCET) 5-325 MG tablet Take 1-2 tablets by mouth every 6 (six) hours as needed for severe pain. 09/15/15   Laurence Spates, MD  traZODone (DESYREL) 50 MG tablet Take 1 tablet (50 mg total) by mouth at bedtime as needed for sleep. 08/31/15   Sanjuana Kava, NP  traZODone (DESYREL) 50 MG tablet Take 50 mg by mouth at bedtime.    Historical Provider, MD    Family History No family history on file.  Social History Social History  Substance Use Topics  . Smoking status: Current Every Day Smoker    Packs/day: 0.50    Types: Cigarettes  . Smokeless tobacco: Never Used  . Alcohol use No     Comment: reports he does not  drink     Allergies   Patient has no known allergies.   Review of Systems Review of Systems  Constitutional: Positive for fever (subjective). Negative for chills.  HENT: Positive for congestion and sore throat. Negative for ear pain and facial swelling.   Respiratory: Positive for cough. Negative for shortness of breath.   Cardiovascular: Positive for chest pain (achy).  Gastrointestinal: Positive for nausea and vomiting. Negative for abdominal pain and diarrhea.  Genitourinary: Negative for dysuria.  Musculoskeletal: Positive for back pain (achy) and myalgias.  Skin: Negative for rash and wound.  Neurological: Positive for headaches.  Negative for dizziness and light-headedness.  Psychiatric/Behavioral: The patient is not nervous/anxious.      Physical Exam Updated Vital Signs BP 114/69 (BP Location: Left Arm)   Pulse 60   Temp 99.4 F (37.4 C) (Oral)   Resp 16   Ht 5\' 7"  (1.702 m)   Wt 59 kg   SpO2 99%   BMI 20.36 kg/m   Physical Exam  Constitutional: He appears well-developed and well-nourished. No distress.  HENT:  Head: Normocephalic and atraumatic.  Right Ear: Tympanic membrane normal.  Left Ear: Tympanic membrane normal.  Mouth/Throat: Oropharynx is clear and moist. No oropharyngeal exudate.  Eyes: Conjunctivae are normal. Pupils are equal, round, and reactive to light. Right eye exhibits no discharge. Left eye exhibits no discharge. No scleral icterus.  Neck: Normal range of motion. Neck supple. No thyromegaly present.  Cardiovascular: Normal rate, regular rhythm, normal heart sounds and intact distal pulses.  Exam reveals no gallop and no friction rub.   No murmur heard. Pulmonary/Chest: Effort normal and breath sounds normal. No stridor. No respiratory distress. He has no wheezes. He has no rales.  Abdominal: Soft. Bowel sounds are normal. He exhibits no distension. There is no tenderness. There is no rebound and no guarding.  Musculoskeletal: He exhibits no edema.  No calf TTP bilaterally  Lymphadenopathy:    He has no cervical adenopathy.  Neurological: He is alert. Coordination normal.  Skin: Skin is warm and dry. No rash noted. He is not diaphoretic. No pallor.  Psychiatric: He has a normal mood and affect.  Nursing note and vitals reviewed.    ED Treatments / Results  Labs (all labs ordered are listed, but only abnormal results are displayed) Labs Reviewed - No data to display  EKG  EKG Interpretation None       Radiology No results found.  Procedures Procedures (including critical care time)  Medications Ordered in ED Medications  ibuprofen (ADVIL,MOTRIN) tablet 800 mg  (not administered)     Initial Impression / Assessment and Plan / ED Course  I have reviewed the triage vital signs and the nursing notes.  Pertinent labs & imaging results that were available during my care of the patient were reviewed by me and considered in my medical decision making (see chart for details).     Patient with symptoms consistent with influenza.  Vitals are stable, afebrile in the ED.  No signs of dehydration, tolerating PO's.  Lungs are clear. Due to patient's presentation and physical exam a chest x-ray was not ordered bc likely diagnosis of flu.  PERC negative. Discussed the cost versus benefit of Tamiflu treatment with the patient.  Patient would like prescription. Ibuprofen given in ED for myalgias. Patient will be discharged with instructions to orally hydrate, rest, and use over-the-counter medications such as anti-inflammatories ibuprofen and Aleve for muscle aches and Tylenol for fever.  Patient will also be  given a cough suppressant. Return precautions discussed. Follow up with PCP as needed. Patient stands and agrees with plan. Patient vitals stable throughout ED course and discharged in satisfactory condition.   Final Clinical Impressions(s) / ED Diagnoses   Final diagnoses:  Influenza-like symptoms    New Prescriptions New Prescriptions   BENZONATATE (TESSALON) 100 MG CAPSULE    Take 1 capsule (100 mg total) by mouth every 8 (eight) hours.   IBUPROFEN (ADVIL,MOTRIN) 800 MG TABLET    Take 1 tablet (800 mg total) by mouth 3 (three) times daily.   ONDANSETRON (ZOFRAN) 4 MG TABLET    Take 1 tablet (4 mg total) by mouth every 6 (six) hours.   OSELTAMIVIR (TAMIFLU) 75 MG CAPSULE    Take 1 capsule (75 mg total) by mouth every 12 (twelve) hours.     Emi Holeslexandra M Ezzie Senat, PA-C 04/27/16 0400    April Palumbo, MD 04/27/16 [phone]
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
## 2017-05-20 ENCOUNTER — Emergency Department (HOSPITAL_COMMUNITY)
Admission: EM | Admit: 2017-05-20 | Discharge: 2017-05-22 | Disposition: A | Discharge status: Other Acute Inpatient Hospital | Payer: Federal, State, Local not specified - Other | Attending: Emergency Medicine | Admitting: Emergency Medicine

## 2017-05-20 ENCOUNTER — Emergency Department (HOSPITAL_COMMUNITY): Payer: Self-pay

## 2017-05-20 ENCOUNTER — Encounter (HOSPITAL_COMMUNITY): Payer: Self-pay | Admitting: Emergency Medicine

## 2017-05-20 ENCOUNTER — Other Ambulatory Visit: Payer: Self-pay

## 2017-05-20 DIAGNOSIS — J181 Lobar pneumonia, unspecified organism: Secondary | ICD-10-CM | POA: Insufficient documentation

## 2017-05-20 DIAGNOSIS — F322 Major depressive disorder, single episode, severe without psychotic features: Secondary | ICD-10-CM

## 2017-05-20 DIAGNOSIS — F319 Bipolar disorder, unspecified: Secondary | ICD-10-CM | POA: Insufficient documentation

## 2017-05-20 DIAGNOSIS — Z008 Encounter for other general examination: Secondary | ICD-10-CM

## 2017-05-20 DIAGNOSIS — J189 Pneumonia, unspecified organism: Secondary | ICD-10-CM

## 2017-05-20 DIAGNOSIS — Z79899 Other long term (current) drug therapy: Secondary | ICD-10-CM | POA: Insufficient documentation

## 2017-05-20 DIAGNOSIS — R45851 Suicidal ideations: Secondary | ICD-10-CM | POA: Insufficient documentation

## 2017-05-20 DIAGNOSIS — F329 Major depressive disorder, single episode, unspecified: Secondary | ICD-10-CM | POA: Insufficient documentation

## 2017-05-20 DIAGNOSIS — F17210 Nicotine dependence, cigarettes, uncomplicated: Secondary | ICD-10-CM | POA: Insufficient documentation

## 2017-05-20 LAB — CBC WITH DIFFERENTIAL/PLATELET
Basophils Absolute: 0 10*3/uL (ref 0.0–0.1)
Basophils Relative: 0 %
EOS ABS: 0 10*3/uL (ref 0.0–0.7)
Eosinophils Relative: 0 %
HCT: 43.6 % (ref 39.0–52.0)
HEMOGLOBIN: 15.5 g/dL (ref 13.0–17.0)
LYMPHS ABS: 1.3 10*3/uL (ref 0.7–4.0)
Lymphocytes Relative: 6 %
MCH: 31.8 pg (ref 26.0–34.0)
MCHC: 35.6 g/dL (ref 30.0–36.0)
MCV: 89.5 fL (ref 78.0–100.0)
MONOS PCT: 11 %
Monocytes Absolute: 2.4 10*3/uL — ABNORMAL HIGH (ref 0.1–1.0)
NEUTROS PCT: 83 %
Neutro Abs: 18.7 10*3/uL — ABNORMAL HIGH (ref 1.7–7.7)
Platelets: 306 10*3/uL (ref 150–400)
RBC: 4.87 MIL/uL (ref 4.22–5.81)
RDW: 12.8 % (ref 11.5–15.5)
WBC: 22.5 10*3/uL — ABNORMAL HIGH (ref 4.0–10.5)

## 2017-05-20 LAB — COMPREHENSIVE METABOLIC PANEL
ALBUMIN: 4.3 g/dL (ref 3.5–5.0)
ALK PHOS: 65 U/L (ref 38–126)
ALT: 14 U/L — AB (ref 17–63)
AST: 26 U/L (ref 15–41)
Anion gap: 10 (ref 5–15)
BILIRUBIN TOTAL: 0.8 mg/dL (ref 0.3–1.2)
BUN: 9 mg/dL (ref 6–20)
CALCIUM: 9.3 mg/dL (ref 8.9–10.3)
CO2: 23 mmol/L (ref 22–32)
CREATININE: 1.1 mg/dL (ref 0.61–1.24)
Chloride: 103 mmol/L (ref 101–111)
GFR calc Af Amer: >60 mL/min (ref >60)
GFR calc non Af Amer: >60 mL/min (ref >60)
GLUCOSE: 93 mg/dL (ref 65–99)
Potassium: 5.8 mmol/L — ABNORMAL HIGH (ref 3.5–5.1)
Sodium: 136 mmol/L (ref 135–145)
Total Protein: 8.5 g/dL — ABNORMAL HIGH (ref 6.5–8.1)

## 2017-05-20 LAB — URINALYSIS, ROUTINE W REFLEX MICROSCOPIC
Bilirubin Urine: NEGATIVE
GLUCOSE, UA: NEGATIVE mg/dL
HGB URINE DIPSTICK: NEGATIVE
Ketones, ur: 5 mg/dL — AB
LEUKOCYTES UA: NEGATIVE
NITRITE: NEGATIVE
PH: 6 (ref 5.0–8.0)
Protein, ur: 100 mg/dL — AB
SPECIFIC GRAVITY, URINE: 1.027 (ref 1.005–1.030)

## 2017-05-20 LAB — SALICYLATE LEVEL: Salicylate Lvl: <7 mg/dL (ref 2.8–30.0)

## 2017-05-20 LAB — RAPID URINE DRUG SCREEN, HOSP PERFORMED
Amphetamines: NOT DETECTED
BARBITURATES: NOT DETECTED
Benzodiazepines: NOT DETECTED
Cocaine: NOT DETECTED
Opiates: NOT DETECTED
TETRAHYDROCANNABINOL: POSITIVE — AB

## 2017-05-20 LAB — LIPASE, BLOOD: LIPASE: 30 U/L (ref 11–51)

## 2017-05-20 LAB — ETHANOL: Alcohol, Ethyl (B): <10 mg/dL (ref <10)

## 2017-05-20 LAB — INFLUENZA PANEL BY PCR (TYPE A & B)
INFLBPCR: NEGATIVE
Influenza A By PCR: NEGATIVE

## 2017-05-20 LAB — ACETAMINOPHEN LEVEL: Acetaminophen (Tylenol), Serum: 12 ug/mL (ref 10–30)

## 2017-05-20 MED ORDER — ONDANSETRON 4 MG PO TBDP
4.0000 mg | ORAL_TABLET | Freq: Once | ORAL | Status: AC
  Administered 2017-05-20: 4 mg via ORAL
  Filled 2017-05-20: qty 1

## 2017-05-20 MED ORDER — IBUPROFEN 200 MG PO TABS
600.0000 mg | ORAL_TABLET | Freq: Once | ORAL | Status: AC
  Administered 2017-05-20: 600 mg via ORAL
  Filled 2017-05-20: qty 3

## 2017-05-20 MED ORDER — DOXYCYCLINE HYCLATE 100 MG PO TABS
100.0000 mg | ORAL_TABLET | Freq: Two times a day (BID) | ORAL | Status: DC
  Administered 2017-05-21 – 2017-05-22 (×3): 100 mg via ORAL
  Filled 2017-05-20 (×3): qty 1

## 2017-05-20 MED ORDER — ONDANSETRON HCL 4 MG PO TABS: 4.0000 mg | ORAL_TABLET | Freq: Three times a day (TID) | ORAL | Status: DC | PRN

## 2017-05-20 MED ORDER — IBUPROFEN 200 MG PO TABS: 600.0000 mg | ORAL_TABLET | Freq: Three times a day (TID) | ORAL | Status: DC | PRN

## 2017-05-20 MED ORDER — DOXYCYCLINE HYCLATE 100 MG PO TABS
100.0000 mg | ORAL_TABLET | Freq: Once | ORAL | Status: AC
  Administered 2017-05-20: 100 mg via ORAL
  Filled 2017-05-20: qty 1

## 2017-05-20 NOTE — ED Triage Notes (Signed)
Per EMS, patient coming from home, c/o cough, body aches and fever for 3 days. Reports taking OTC medications at home.   1000mg  Tylenol with EMS.

## 2017-05-20 NOTE — ED Provider Notes (Signed)
[facility]-EMERGENCY DEPT Provider Note   CSN: [id number] Arrival date & time: 05/20/17  1811     History   Chief Complaint Chief Complaint  Patient presents with  . Cough  . sucide idealation    HPI Jonathan [name] is a 22 y.o. male with a history of bipolar 1 disorder who presents emergency department today for URI symptoms and suicidal ideation.  Patient reports that over the last 3 days he has had generalized body aches, nonproductive cough, congestion, nonbilious & nonbloody emesis as well as diarrhea.  Patient reports that he has recent exposure to flu as his mom is now admitted for this.  He did not receive a flu vaccine this year.  He has been taking TheraFlu at home without relief.  Last episode of emesis was 2 hours ago.  He notes he is tolerating p.o. liquid.  He has decreased oral solid intake due to nausea feeling well.  He denies any associated ear pain, headache, neck stiffness, rash, chest pain, shortness of breath, hemoptysis, abdominal pain, or urinary symptoms.  Patient has had since January 15 has had thoughts of harming herself.  He notes on this day his grandmother passed away.  He has increased feelings of sadness and wanting to harm himself since his mother was recently admitted to the hospital.  He thinks frequently about cutting himself and is seeking voluntary help for this.  He has not attempted self-harm.  No alcohol or drug use.  No homicidal ideation.  HPI  Past Medical History:  Diagnosis Date  . Bipolar 1 disorder [facility])     Patient Active Problem List   Diagnosis Date Noted  . MDD (major depressive disorder), severe (HCC) 08/28/2015  . Severe major depression without psychotic features (HCC) 08/27/2015    History reviewed. No pertinent surgical history.     Home Medications    Prior to Admission medications   Medication Sig Start Date End Date Taking? Authorizing Provider  benzonatate (TESSALON) 100 MG capsule Take 1  capsule (100 mg total) by mouth every 8 (eight) hours. 04/27/16   Law, Waylan Boga, PA-C  cephALEXin (KEFLEX) 500 MG capsule Take 1 capsule (500 mg total) by mouth 2 (two) times daily. 09/15/15   Little, Ambrose Finland, MD  citalopram (CELEXA) 10 MG tablet Take 1 tablet (10 mg total) by mouth daily. For depression 08/31/15   Armandina Stammer I, NP  citalopram (CELEXA) 20 MG tablet Take 20 mg by mouth daily.    [provider]  divalproex (DEPAKOTE ER) 250 MG 24 hr tablet Take 3 tablets (750 mg total) by mouth at bedtime. 08/31/15   Adonis Brook, NP  divalproex (DEPAKOTE) 250 MG DR tablet Take 750 mg by mouth daily.    [provider]  ibuprofen (ADVIL,MOTRIN) 800 MG tablet Take 1 tablet (800 mg total) by mouth 3 (three) times daily. 04/27/16   Law, Waylan Boga, PA-C  nicotine (NICODERM CQ - DOSED IN MG/24 HOURS) 21 mg/24hr patch Place 1 patch (21 mg total) onto the skin daily. For smoking cessation 08/31/15   Armandina Stammer I, NP  ondansetron (ZOFRAN) 4 MG tablet Take 1 tablet (4 mg total) by mouth every 6 (six) hours. 04/27/16   Law, Waylan Boga, PA-C  oseltamivir (TAMIFLU) 75 MG capsule Take 1 capsule (75 mg total) by mouth every 12 (twelve) hours. 04/27/16   Law, Waylan Boga, PA-C  oxyCODONE-acetaminophen (PERCOCET) 5-325 MG tablet Take 1-2 tablets by mouth every 6 (six) hours as needed for  severe pain. 09/15/15   Little, Ambrose Finland, MD  traZODone (DESYREL) 50 MG tablet Take 1 tablet (50 mg total) by mouth at bedtime as needed for sleep. 08/31/15   Armandina Stammer I, NP  traZODone (DESYREL) 50 MG tablet Take 50 mg by mouth at bedtime.    [provider]    Family History No family history on file.  Social History Social History   Tobacco Use  . Smoking status: Current Every Day Smoker    Packs/day: 0.50    Types: Cigarettes  . Smokeless tobacco: Never Used  Substance Use Topics  . Alcohol use: No    Comment: reports he does not drink  . Drug use: Yes    Types: Marijuana       Allergies   Patient has no known allergies.   Review of Systems Review of Systems  All other systems reviewed and are negative.    Physical Exam Updated Vital Signs BP (!) 105/58 (BP Location: Left Arm)   Pulse 89   Temp 99.8 F (37.7 C) (Oral)   Resp 20   Ht 5\' 7"  (1.702 m)   Wt 63.5 kg (140 lb)   SpO2 94%   BMI 21.93 kg/m   Physical Exam  Constitutional: He appears well-developed and well-nourished.  HENT:  Head: Normocephalic and atraumatic.  Right Ear: Tympanic membrane and external ear normal.  Left Ear: Tympanic membrane and external ear normal.  Nose: Mucosal edema present. Right sinus exhibits no maxillary sinus tenderness and no frontal sinus tenderness. Left sinus exhibits no maxillary sinus tenderness and no frontal sinus tenderness.  Mouth/Throat: Uvula is midline, oropharynx is clear and moist and mucous membranes are normal. No tonsillar exudate.  The patient has normal phonation and is in control of secretions. No stridor.  Midline uvula without edema. Soft palate rises symmetrically.  No tonsillar erythema or exudates. No PTA. Tongue protrusion is normal. No trismus. No creptius on neck palpation and patient has good dentition. No gingival erythema or fluctuance noted. Mucus membranes moist.   Eyes: Pupils are equal, round, and reactive to light. Right eye exhibits no discharge. Left eye exhibits no discharge. No scleral icterus.  Neck: Trachea normal. Neck supple. No spinous process tenderness present. No neck rigidity. Normal range of motion present.  No nuchal rigidity or meningismus  Cardiovascular: Normal rate, regular rhythm and intact distal pulses.  No murmur heard. Pulses:      Radial pulses are 2+ on the right side, and 2+ on the left side.       Dorsalis pedis pulses are 2+ on the right side, and 2+ on the left side.       Posterior tibial pulses are 2+ on the right side, and 2+ on the left side.  No lower extremity swelling or edema. Calves  symmetric in size bilaterally.  Pulmonary/Chest: Effort normal. No accessory muscle usage. No tachypnea. No respiratory distress. He has rales in the right lower field. He exhibits no tenderness.  No increased work of breathing. No accessory muscle use. Patient is sitting upright, speaking in full sentences without difficulty   Abdominal: Soft. Bowel sounds are normal. He exhibits no distension. There is no tenderness. There is no rigidity, no rebound, no guarding and no CVA tenderness.  Musculoskeletal: He exhibits no edema.  Lymphadenopathy:    He has no cervical adenopathy.  Neurological: He is alert.  Skin: Skin is warm and dry. Capillary refill takes less than 2 seconds. No petechiae, no purpura and  no rash noted. He is not diaphoretic.  Psychiatric: He has a normal mood and affect.  Nursing note and vitals reviewed.    ED Treatments / Results  Labs (all labs ordered are listed, but only abnormal results are displayed) Labs Reviewed  COMPREHENSIVE METABOLIC PANEL  ETHANOL  RAPID URINE DRUG SCREEN, HOSP PERFORMED  URINALYSIS, ROUTINE W REFLEX MICROSCOPIC  CBC WITH DIFFERENTIAL/PLATELET  LIPASE, BLOOD  ACETAMINOPHEN LEVEL  SALICYLATE LEVEL  INFLUENZA PANEL BY PCR (TYPE A & B)    EKG  EKG Interpretation None       Radiology Dg Chest 2 View  Result Date: 05/20/2017 CLINICAL DATA:  Cough for 3 days.  Smoker. EXAM: CHEST  2 VIEW COMPARISON:  None. FINDINGS: Patchy consolidation in the right lower lobe consistent with pneumonia. Mild central bronchial thickening. Normal heart size and mediastinal contours. No pleural effusion or pneumothorax. No acute osseous abnormalities. IMPRESSION: Right lower lobe pneumonia. Electronically Signed   By: Rubye OaksMelanie  Ehinger M.D.   On: 05/20/2017 21:11    Procedures Procedures (including critical care time)  Medications Ordered in ED Medications  ondansetron (ZOFRAN-ODT) disintegrating tablet 4 mg (4 mg Oral Given 05/20/17 2148)    doxycycline (VIBRA-TABS) tablet 100 mg (100 mg Oral Given 05/20/17 2149)  ibuprofen (ADVIL,MOTRIN) tablet 600 mg (600 mg Oral Given 05/20/17 2149)     Initial Impression / Assessment and Plan / ED Course  I have reviewed the triage vital signs and the nursing notes.  Pertinent labs & imaging results that were available during my care of the patient were reviewed by me and considered in my medical decision making (see chart for details).     22 y.o. male with fever, generalized body aches, congestion, cough, nausea/vomiting/diarrhea over the last 3 days.  Patient noted to have a low-grade temperature of 100.1 on arrival as well as tachycardia of 108.  No tachypnea, hypoxia or hypotension.  On exam the patient does have mucosal edema. Patient exam without evidence of AOM. No meningeal signs  Oropharynx is clear. Do not suspect strep.  No rash.  Lungs exam with crackles of the RLL,.Will obtain xray.  Abdomen is soft, nondistended and without tenderness. Do not suspect intra-abdominal pathology. Patient without reported urinary symptoms that would make me believe UTI is what is the source of the patient's fever.  Chest x-ray with evidence of pneumonia of the right lower lobe.  Will give patient doxycycline.   Patient also with increase sadness and thoughts of harming himself.  He has a plan.  No homicidal ideation.  Will order clearance labs and influenza test.  TTS consulted.  Patient noted to have negative flu test.  Hyperkalemia 5.8 noted but not requiring intervention.  He has reassuring.  Alcohol negative.  UA without evidence of infection.  Leukocytosis noted likely due to pneumonia.  Patient is without hypoxia in the department.  No respiratory distress.  She given Zofran in the department and is tolerating p.o. fluids.  He is to drink several liters of fluid as well as eating without difficulty.  No nausea, vomiting or diarrhea in the department.  Vital signs remained reassuring.  Feel patient can  be medically cleared.  Ordered doxycycline 100 mg twice daily pending TTS consult.    TTS recommend inpatient treatment. Patient case discussed with Dr. Juleen ChinaKohut who is in agreement with plan.  Vitals:   05/20/17 1824 05/20/17 2008 05/20/17 2010 05/20/17 2320  BP: (!) 103/50 (!) 105/58    Pulse: (!) 108 89  Resp: 20 20    Temp: 100.1 F (37.8 C) 99.8 F (37.7 C)  98.4 F (36.9 C)  TempSrc: Oral Oral  Oral  SpO2: 94% 94%    Weight:   63.5 kg (140 lb)   Height:   5\' 7"  (1.702 m)      Final Clinical Impressions(s) / ED Diagnoses   Final diagnoses:  Community acquired pneumonia of right lower lobe of lung (HCC)  Suicidal ideation  Medical clearance for psychiatric admission    ED Discharge Orders    None       Princella Pellegrini 05/20/17 2335    Raeford Razor, MD 05/21/17 0001

## 2017-05-20 NOTE — ED Notes (Signed)
Bed: [id number] Expected date:  Expected time:  Means of arrival:  Comments: Triage 5 

## 2017-05-20 NOTE — ED Notes (Signed)
Pt is alert and oriented x 4 and is verbally responsive. Pt reports cough and generalized body aches and fever x 3 day. Pt reports taking theraflu and was not effective. Pt denies sore throat but reports n/v/d. Pt also reports that he has had SI that has been going on since Jan. 15 th after the passing of his grandmother, pt reports that he was thinking of cutting himself today. Pt desires to be evaluated and speak with someone and reports having hx of SI .

## 2017-05-21 MED ORDER — CITALOPRAM HYDROBROMIDE 10 MG PO TABS
10.0000 mg | ORAL_TABLET | Freq: Every day | ORAL | Status: DC
  Administered 2017-05-21 – 2017-05-22 (×2): 10 mg via ORAL
  Filled 2017-05-21 (×2): qty 1

## 2017-05-21 MED ORDER — DIVALPROEX SODIUM ER 500 MG PO TB24
750.0000 mg | ORAL_TABLET | Freq: Every day | ORAL | Status: DC
  Administered 2017-05-21: 21:00:00 750 mg via ORAL
  Filled 2017-05-21: qty 1

## 2017-05-21 MED ORDER — TRAZODONE HCL 50 MG PO TABS: 50.0000 mg | ORAL_TABLET | Freq: Every evening | ORAL | Status: DC | PRN

## 2017-05-21 NOTE — ED Notes (Signed)
Pt talking on hallway phone.  

## 2017-05-21 NOTE — ED Notes (Signed)
Patient reports SI with no plan and denies HI/AVH. Plan of care discussed. Encouragement and support provided and safety maintain. Q 15 min safety checks in place and video monitoring.

## 2017-05-21 NOTE — Progress Notes (Signed)
Provided pt information for Advanced Directives, as requested. Pt was asleep, so the packet of information was placed on the table bedside.

## 2017-05-21 NOTE — ED Notes (Signed)
Pt behavior cooperative, sad affect. Pt reports increase in depression since grandmother passed away. Pt endorsing SI, Denies HI/AVH. Encouragement and support provided. Special checks q 15 mins in place for safety, Video monitoring in place. Will continue to monitor.

## 2017-05-21 NOTE — BH Assessment (Signed)
Assessment Note  Jonathan [name] is a 22 y.o. male who arrived to the [facility] via EMS due to illness. Upon arrival, pt expressed thoughts of suicide that he has been having since his grandmother's death on April 16, 2017. Pt shared he has felt suicidal in the past but that the feelings were not this strong. He reports his plan was to cut himself.  Pt shares he does not currently have a therapist nor a psychiatrist but that he has made an appointment with a therapist which is for June 06, 2017. Pt states he knows he cannot wait that long, which is why he has requested help today. Pt has no history of MH/SA services, with the exception of when he was admitted [facility] approximately 2 years ago for self-harm. Pt estimates that he was cutting himself for a couple of months before [facility] and that he never cut himself again. Pt states he was cutting himself for self-harm and that it was not suicidal ideation or attempts.  Pt denies HI, AVH, or self-harm. Pt reports no criminal charges, no court dates, and no probation. He states his mother "drinks excessively" and that she currently lives in a nursing home dur to her COPD. He states he has an older brother and sister for support. He states his father is not a support to him.  Pt shares he lives with his girlfriend and their 445-week-old baby. Pt expresses no concern regarding this relationship or his ability to continue to stay with them.  Pt shares he has been drinking approximately a 6-pack of 12 oz beers and a pint daily as of recent. He reports he has been smoking "too much" marijuana, which he estimates to be several bowls.  Pt is oriented x4. His memory is intact. He is pleasant and expresses wanting help. He is open to inpatient treatment if that is what is deemed necessary for him.  Pt's Psych PA, Karleen HampshireSpencer, has approved pt for inpatient treatment. Pt's Medical PA has approved pt to be physically appropriate for inpatient treatment. Pt has  been placed on 7 days of antibiotics. Due to medical concerns re: pt's pneumonia, he will be referred to other facilities.  Diagnosis: F33.1, Major depressive disorder, Recurrent episode, Moderate   Past Medical History:  Past Medical History:  Diagnosis Date  . Bipolar 1 disorder (HCC)     History reviewed. No pertinent surgical history.  Family History: No family history on file.  Social History:  reports that he has been smoking cigarettes.  He has been smoking about 0.50 packs per day. he has never used smokeless tobacco. He reports that he uses drugs. Drug: Marijuana. He reports that he does not drink alcohol.  Additional Social History:  Alcohol / Drug Use Pain Medications: Please see MAR Prescriptions: Please see MAR Over the Counter: Please see MAR History of alcohol / drug use?: Yes Longest period of sobriety (when/how long): Unknown Substance #1 Name of Substance 1: ETOH 1 - Age of First Use: 17 1 - Amount (size/oz): 6 pack of 12-oz beer and 1 pint of liquor 1 - Frequency: Daily (until became sick) 1 - Duration: Unknown 1 - Last Use / Amount: May 17, 2017 Substance #2 Name of Substance 2: Marijuana 2 - Age of First Use: 18 2 - Amount (size/oz): Couple of bowls 2 - Frequency: Several times per week 2 - Duration: Unknown 2 - Last Use / Amount: May 14, 2017  CIWA: CIWA-Ar BP: (!) 105/58 Pulse  Rate: 89 COWS:    Allergies: No Known Allergies  Home Medications:  (Not in a hospital admission)  OB/GYN Status:  No LMP for male patient.  General Assessment Data [facility] of Assessment: [facility] Assessment: In system Is this a Tele or Face-to-Face Assessment?: Face-to-Face Is this an Initial Assessment or a Re-assessment for this encounter?: Initial Assessment Marital status: Single Maiden name: Kutzer Is patient pregnant?: No Pregnancy Status: No Living Arrangements: Spouse/significant other Can pt return to current living arrangement?: Yes Admission  Status: Voluntary Is patient capable of signing voluntary admission?: Yes Referral Source: Self/Family/Friend Insurance type: None     Crisis Care Plan Living Arrangements: Spouse/significant other Legal Guardian: Other:(Self) Name of Psychiatrist: N/A Name of Therapist: N/A  Education Status Is patient currently in school?: No Current Grade: N/A Highest grade of school patient has completed: 12th (HS Diploma) Name of school: N/A Contact person: N/A  Risk to self with the past 6 months Suicidal Ideation: Yes-Currently Present Has patient been a risk to self within the past 6 months prior to admission? : Yes Suicidal Intent: Yes-Currently Present Has patient had any suicidal intent within the past 6 months prior to admission? : Yes Is patient at risk for suicide?: Yes Suicidal Plan?: Yes-Currently Present Has patient had any suicidal plan within the past 6 months prior to admission? : Yes Specify Current Suicidal Plan: Pt plans to cut himself Access to Means: Yes Specify Access to Suicidal Means: Pt could get a knife to cut himself What has been your use of drugs/alcohol within the last 12 months?: Pt has used ETOH and marijuana Previous Attempts/Gestures: No How many times?: 0 Other Self Harm Risks: SA Triggers for Past Attempts: Other (Comment)(Death of grandparent, very sick grandmother) Intentional Self Injurious Behavior: None Family Suicide History: No Recent stressful life event(s): Other (Comment)(Death of grandmother, pt's mother is very sick) Persecutory voices/beliefs?: No Depression: Yes Depression Symptoms: Loss of interest in usual pleasures, Guilt, Feeling worthless/self pity Substance abuse history and/or treatment for substance abuse?: Yes(Pt has been using ETOH and marijuana) Suicide prevention information given to non-admitted patients: Not applicable  Risk to Others within the past 6 months Homicidal Ideation: No Does patient have any lifetime risk of  violence toward others beyond the six months prior to admission? : No Thoughts of Harm to Others: No Current Homicidal Intent: No Current Homicidal Plan: No Access to Homicidal Means: No Identified Victim: N/A History of harm to others?: No Assessment of Violence: On admission Violent Behavior Description: N/A Does patient have access to weapons?: No Criminal Charges Pending?: No Does patient have a court date: No Is patient on probation?: No  Psychosis Hallucinations: None noted Delusions: None noted  Mental Status Report Appearance/Hygiene: In scrubs, Unremarkable, Other (Comment)(Pt is feeling ill) Eye Contact: Good Motor Activity: Unremarkable, Agitation(Pt is feeling ill) Speech: Logical/coherent, Soft Level of Consciousness: Drowsy, Quiet/awake Mood: Depressed, Empty, Sad, Sullen, Worthless, low self-esteem, Pleasant Affect: Depressed, Sad, Sullen, Blunted Anxiety Level: Moderate Thought Processes: Coherent, Relevant Judgement: Partial Orientation: Person, Place, Time, Situation Obsessive Compulsive Thoughts/Behaviors: None  Cognitive Functioning Concentration: Fair(Pt is feeling ill) Memory: Recent Intact, Remote Intact IQ: Average Insight: Fair Impulse Control: Fair Appetite: Good Weight Loss: 0 Weight Gain: 0 Sleep: Decreased Total Hours of Sleep: 5 Vegetative Symptoms: None  ADLScreening [facility] Assessment Services) Patient's cognitive ability adequate to safely complete daily activities?: Yes Patient able to express need for assistance with ADLs?: Yes Independently performs ADLs?: Yes (appropriate for developmental age)  Prior Inpatient  Therapy Prior Inpatient Therapy: Yes(Pt was at [facility] approx 2 yrs ago for cutting self) Prior Therapy Dates: Approx 2 years ago Prior Therapy Facilty/Provider(s): [facility] Reason for Treatment: Self-Harm  Prior Outpatient Therapy Prior Outpatient Therapy: No Prior Therapy Dates: N/A Prior Therapy Facilty/Provider(s):  N/A Reason for Treatment: N/A Does patient have an ACCT team?: No Does patient have Intensive In-House Services?  : No Does patient have [facility] services? : No Does patient have P4CC services?: No  ADL Screening (condition at time of admission) Patient's cognitive ability adequate to safely complete daily activities?: Yes Is the patient deaf or have difficulty hearing?: No Does the patient have difficulty seeing, even when wearing glasses/contacts?: No Does the patient have difficulty concentrating, remembering, or making decisions?: No Patient able to express need for assistance with ADLs?: Yes Does the patient have difficulty dressing or bathing?: No Independently performs ADLs?: Yes (appropriate for developmental age) Does the patient have difficulty walking or climbing stairs?: No       Abuse/Neglect Assessment (Assessment to be complete while patient is alone) Abuse/Neglect Assessment Can Be Completed: Yes Physical Abuse: Denies Verbal Abuse: Denies Sexual Abuse: Denies Exploitation of patient/patient's resources: Denies Self-Neglect: Denies     Merchant navy officer (For Healthcare) Does Patient Have a Medical Advance Directive?: No Would patient like information on creating a medical advance directive?: Yes (MAU/Ambulatory/Procedural Areas - Information given)(Pt was interested in information; information provided)    Additional Information 1:1 In Past 12 Months?: No CIRT Risk: No Elopement Risk: No Does patient have medical clearance?: Yes     Disposition:  Disposition Initial Assessment Completed for this Encounter: Yes Disposition of Patient: Inpatient treatment program Type of inpatient treatment program: Adult  On Site Evaluation by:   Reviewed with Physician:    Ralph Dowdy 05/21/2017 12:21 AM

## 2017-05-21 NOTE — Progress Notes (Addendum)
Talked to Pt's current PA, Sharilyn SitesLisa Sanders, re: [facility]. PA expressed an understanding and the attempt to have pt placed as soon as possible due to SI while he is currently ill with pneumonia.

## 2017-05-22 ENCOUNTER — Encounter (HOSPITAL_COMMUNITY): Payer: Self-pay | Admitting: *Deleted

## 2017-05-22 ENCOUNTER — Other Ambulatory Visit: Payer: Self-pay

## 2017-05-22 ENCOUNTER — Inpatient Hospital Stay (HOSPITAL_COMMUNITY)
Admission: AD | Admit: 2017-05-22 | Discharge: 2017-05-27 | DRG: 885 | Disposition: A | Discharge status: Home / Self Care | Payer: Federal, State, Local not specified - Other | Source: Intra-hospital | Attending: Psychiatry | Admitting: Psychiatry

## 2017-05-22 DIAGNOSIS — F419 Anxiety disorder, unspecified: Secondary | ICD-10-CM | POA: Diagnosis present

## 2017-05-22 DIAGNOSIS — J189 Pneumonia, unspecified organism: Secondary | ICD-10-CM | POA: Diagnosis present

## 2017-05-22 DIAGNOSIS — F1290 Cannabis use, unspecified, uncomplicated: Secondary | ICD-10-CM

## 2017-05-22 DIAGNOSIS — Z560 Unemployment, unspecified: Secondary | ICD-10-CM | POA: Diagnosis not present

## 2017-05-22 DIAGNOSIS — Z915 Personal history of self-harm: Secondary | ICD-10-CM

## 2017-05-22 DIAGNOSIS — F332 Major depressive disorder, recurrent severe without psychotic features: Secondary | ICD-10-CM | POA: Diagnosis present

## 2017-05-22 DIAGNOSIS — Z590 Homelessness: Secondary | ICD-10-CM | POA: Diagnosis not present

## 2017-05-22 DIAGNOSIS — R45851 Suicidal ideations: Secondary | ICD-10-CM

## 2017-05-22 DIAGNOSIS — F17210 Nicotine dependence, cigarettes, uncomplicated: Secondary | ICD-10-CM

## 2017-05-22 DIAGNOSIS — F322 Major depressive disorder, single episode, severe without psychotic features: Secondary | ICD-10-CM

## 2017-05-22 DIAGNOSIS — Z634 Disappearance and death of family member: Secondary | ICD-10-CM

## 2017-05-22 DIAGNOSIS — G4710 Hypersomnia, unspecified: Secondary | ICD-10-CM | POA: Diagnosis present

## 2017-05-22 DIAGNOSIS — Z599 Problem related to housing and economic circumstances, unspecified: Secondary | ICD-10-CM | POA: Diagnosis not present

## 2017-05-22 DIAGNOSIS — G4700 Insomnia, unspecified: Secondary | ICD-10-CM

## 2017-05-22 DIAGNOSIS — Z811 Family history of alcohol abuse and dependence: Secondary | ICD-10-CM

## 2017-05-22 LAB — CBC
HCT: 41.3 % (ref 39.0–52.0)
HEMOGLOBIN: 14.5 g/dL (ref 13.0–17.0)
MCH: 31.5 pg (ref 26.0–34.0)
MCHC: 35.1 g/dL (ref 30.0–36.0)
MCV: 89.6 fL (ref 78.0–100.0)
Platelets: 330 10*3/uL (ref 150–400)
RBC: 4.61 MIL/uL (ref 4.22–5.81)
RDW: 12.7 % (ref 11.5–15.5)
WBC: 6.1 10*3/uL (ref 4.0–10.5)

## 2017-05-22 MED ORDER — DOXYCYCLINE HYCLATE 100 MG PO TABS
100.0000 mg | ORAL_TABLET | Freq: Two times a day (BID) | ORAL | Status: DC
  Administered 2017-05-22 – 2017-05-27 (×10): 100 mg via ORAL
  Filled 2017-05-22 (×11): qty 1

## 2017-05-22 MED ORDER — IBUPROFEN 600 MG PO TABS
600.0000 mg | ORAL_TABLET | Freq: Three times a day (TID) | ORAL | Status: DC | PRN
  Administered 2017-05-22: 600 mg via ORAL
  Filled 2017-05-22: qty 1

## 2017-05-22 MED ORDER — ONDANSETRON HCL 4 MG PO TABS: 4.0000 mg | ORAL_TABLET | Freq: Three times a day (TID) | ORAL | Status: DC | PRN

## 2017-05-22 MED ORDER — DIVALPROEX SODIUM ER 250 MG PO TB24
750.0000 mg | ORAL_TABLET | Freq: Every day | ORAL | Status: DC
  Administered 2017-05-22 – 2017-05-26 (×5): 750 mg via ORAL
  Filled 2017-05-22 (×6): qty 3

## 2017-05-22 MED ORDER — ACETAMINOPHEN 325 MG PO TABS: 650.0000 mg | ORAL_TABLET | Freq: Four times a day (QID) | ORAL | Status: DC | PRN

## 2017-05-22 MED ORDER — CITALOPRAM HYDROBROMIDE 10 MG PO TABS
10.0000 mg | ORAL_TABLET | Freq: Every day | ORAL | Status: DC
  Administered 2017-05-23 – 2017-05-24 (×2): 10 mg via ORAL
  Filled 2017-05-22 (×4): qty 1

## 2017-05-22 MED ORDER — TRAZODONE HCL 50 MG PO TABS
50.0000 mg | ORAL_TABLET | Freq: Every evening | ORAL | Status: DC | PRN
  Administered 2017-05-22 – 2017-05-26 (×5): 50 mg via ORAL
  Filled 2017-05-22 (×4): qty 1
  Filled 2017-05-22: qty 7
  Filled 2017-05-22: qty 1

## 2017-05-22 MED ORDER — ALUM & MAG HYDROXIDE-SIMETH 200-200-20 MG/5ML PO SUSP: 30.0000 mL | ORAL | Status: DC | PRN

## 2017-05-22 MED ORDER — MAGNESIUM HYDROXIDE 400 MG/5ML PO SUSP: 30.0000 mL | Freq: Every day | ORAL | Status: DC | PRN

## 2017-05-22 NOTE — ED Notes (Signed)
Patient reports SI and denies HI/AVH. Plan of care discussed. Encouragement and support provided and safety maintain. Q 15 min safety checks remain in place and video monitoring.

## 2017-05-22 NOTE — [facility] Suicide Risk Assessment (Signed)
[facility] Admission Suicide Risk Assessment   Nursing information obtained from:   patient and chart  Demographic factors:   22 year old male , was living with GF, has an infant son who is with the mother, currently unemployed  Current Mental Status:   see below Loss Factors:   homelessness ( had been living with girlfriend and their infant child, but states she asked him to leave), mother medically ill and currently hospitalized  Historical Factors:   depression. Denies prior psychiatric admissions. States he has never attempted suicide. Denies history of psychosis . Risk Reduction Factors:   resilience , sense of responsibility to his family .  Total Time spent with patient: 45 minutes Principal Problem:  Depression Diagnosis:   Patient Active Problem List   Diagnosis Date Noted  . Major depressive disorder, recurrent severe without psychotic features (HCC) [F33.2] 05/22/2017  . MDD (major depressive disorder), severe (HCC) [F32.2] 08/28/2015    Continued Clinical Symptoms:  Alcohol Use Disorder Identification Test Final Score (AUDIT): 5 The "Alcohol Use Disorders Identification Test", Guidelines for Use in Primary Care, Second Edition.  World Science writer [facility]). Score between 0-7:  no or low risk or alcohol related problems. Score between 8-15:  moderate risk of alcohol related problems. Score between 16-19:  high risk of alcohol related problems. Score 20 or above:  warrants further diagnostic evaluation for alcohol dependence and treatment.   CLINICAL FACTORS:  22 year old male, presented to ED reporting myalgias and cough. (  Elevated WBC and CX Ray read as R LL pneumonia.) In ED he also reported depression and suicidal ideations. He reports he has been depressed over the last few weeks, related to stressors, unemployment.  He states he has had intermittent suicidal ideations, with thoughts of cutting self ,reports neuro-vegetative symptoms- describes pervasive sadness, some  anhedonia, erratic appetite, poor energy.  States he has been facing significant stressors, losses. Grandmother passed away in 09-May-2017, mother is medically ill and currently admitted to hospital, has had relationship stressors with his GF, and reports she has kicked him out , so that he is now homeless.  Denies prior psychiatric admissions, denies history of mania , but endorses history suggestive of intermittent explosive disorder, with frequent episodes of angry outbursts, explosiveness, of short duration. States " I feel really bad about it", and states this has played a role in recent relationship stressors. A few months ago  had gone to [facility]- had been started on Depakote, Celexa. States he took them briefly , but feels they helped and were well tolerated. He has not been taking medications in several weeks to months. Patient reports he drinks a few times a week, usually 2-3 beers. He states he uses cannabis daily.   Denies any chronic medical illnesses, NKDA. As above, recently diagnosed with pneumonia.   Dx- MDD, no psychotic features. Intermittent Explosive Disorder   Plan - inpatient admission. Has been started on Depakote ER 750 mgrs QHS and on Celexa 10 mgrs QDAY. He is also on Doxycycline course.   Musculoskeletal: Strength & Muscle Tone: within normal limits Gait & Station: normal Patient leans: N/A  Psychiatric Specialty Exam: Physical Exam  ROS  Reports headache, reports improving cough, denies chest or pleuritic pain, no dyspnea at room air,  denies fever or chills   Blood pressure 111/68, pulse 68, temperature 98.4 F (36.9 C), temperature source Oral, resp. rate 18, height 5\' 8"  (1.727 m), weight 58.5 kg (129 lb).Body mass index is 19.61 kg/m.  General Appearance: Fairly Groomed  Eye Contact:  Fair  Speech:  Normal Rate  Volume:  Decreased  Mood:  depressed   Affect:  Constricted  Thought Process:  Linear and Descriptions of Associations: Intact   Orientation:  Other:  fully alert and attentive   Thought Content:  no hallucinations, no delusions, not internally preoccupied   Suicidal Thoughts:  No currently denies suicidal ideations or self injurious ideations, able to contract for safety on unit   Homicidal Thoughts:  No specifically also denies any HI towards GF  Memory:  recent and remote grossly intact   Judgement:  Fair  Insight:  Fair  Psychomotor Activity:  Normal no tremors , no diaphoresis  Concentration:  Concentration: Good and Attention Span: Good  Recall:  Good  Fund of Knowledge:  Good  Language:  Good  Akathisia:  Negative  Handed:  Right  AIMS (if indicated):     Assets:  Communication Skills Desire for Improvement Resilience  ADL's:  Intact  Cognition:  WNL  Sleep:         COGNITIVE FEATURES THAT CONTRIBUTE TO RISK:  Closed-mindedness and Loss of executive function    SUICIDE RISK:   Moderate:  Frequent suicidal ideation with limited intensity, and duration, some specificity in terms of plans, no associated intent, good self-control, limited dysphoria/symptomatology, some risk factors present, and identifiable protective factors, including available and accessible social support.  PLAN OF CARE: Patient will be admitted to inpatient psychiatric unit for stabilization and safety. Will provide and encourage milieu participation. Provide medication management and maked adjustments as needed.  Will follow daily.    I certify that inpatient services furnished can reasonably be expected to improve the patient's condition.   Craige CottaFernando A Cobos, MD 05/22/2017, 5:51 PM

## 2017-05-22 NOTE — ED Notes (Signed)
Pt transported to [facility] by [facility]. All belongings returned to pt who signed for same. Pt was calm and cooperative.  

## 2017-05-22 NOTE — Progress Notes (Signed)
Jonathan [name] has been medically cleared and is appropriate for the psychiatric unit/millieu. He does not require contact precautions.   Juanetta BeetsJacqueline Joeann Steppe, DO 05/22/17 10:20 AM

## 2017-05-22 NOTE — [facility] Group Notes (Signed)
[facility] Group Notes:  Mindfulness  Date:  05/22/2017  Time:  5:41 PM  Type of Therapy:  Psychoeducational Skills  Participation Level:  Active  Participation Quality:  Appropriate and Attentive  Affect:  Appropriate  Cognitive:  Alert and Appropriate  Insight:  Appropriate and Good  Engagement in Group:  Engaged  Modes of Intervention:  Activity, Discussion and Education  Summary of Progress/Problems: Patient attended group and was fully engaged.   

## 2017-05-22 NOTE — Tx Team (Signed)
Initial Treatment Plan 05/22/2017 2:34 PM Cervando Marca AnconaGilmore [id number]    PATIENT STRESSORS: Loss of grandmother Marital or family conflict Medication change or noncompliance Substance abuse   PATIENT STRENGTHS: Ability for insight Average or above average intelligence Capable of independent living General fund of knowledge Motivation for treatment/growth   PATIENT IDENTIFIED PROBLEMS: Depression Suicidal thoughts "I been smoking too much weed" "I need help with depression and my suicidal thoughts"                     DISCHARGE CRITERIA:  Ability to meet basic life and health needs Improved stabilization in mood, thinking, and/or behavior Reduction of life-threatening or endangering symptoms to within safe limits Verbal commitment to aftercare and medication compliance  PRELIMINARY DISCHARGE PLAN: Attend aftercare/continuing care group Return to previous living arrangement  PATIENT/FAMILY INVOLVEMENT: This treatment plan has been presented to and reviewed with the patient, Jonathan ArntDeandre Weller, and/or family member, .  The patient and family have been given the opportunity to ask questions and make suggestions.  Olufemi Mofield, [place], [place] 05/22/2017, 2:34 PM

## 2017-05-22 NOTE — Consult Note (Addendum)
[facility] Consult   Reason for Consult:  Suicidal ideations Referring Physician:  EDP Patient Identification: Jonathan [name] MRN:  [id number] Principal Diagnosis: MDD (major depressive disorder), severe ([place]) Diagnosis:   Patient Active Problem List   Diagnosis Date Noted  . MDD (major depressive disorder), severe ([place]) [F32.2] 08/28/2015    Priority: High    Total Time spent with patient: 45 minutes  Subjective:   Jonathan [name] is a 22 y.o. male patient admitted with suicide threats.  HPI:  22 yo male who presented to the ED with suicidal ideations and plan to cut himself.  His big issue is being homeless and having a fight with the mother of his baby who told him to leave.  He reports alcohol use but negative on admission except for cannabis.  His mother is in ICU and is to be extubated today due to flu but also has end-stage COPD.  When asked about if he needed to be with her, he stated, "She's fine, she just has a tube down her throat."  No homicidal ideations, hallucinations, or withdrawal symptoms.  Past Psychiatric History: substance abuse, depression  Risk to Self: Suicidal Ideation: Yes-Currently Present Suicidal Intent: Yes-Currently Present Is patient at risk for suicide?: Yes Suicidal Plan?: Yes-Currently Present Specify Current Suicidal Plan: Pt plans to cut himself Access to Means: Yes Specify Access to Suicidal Means: Pt could get a knife to cut himself What has been your use of drugs/alcohol within the last 12 months?: Pt has used ETOH and marijuana How many times?: 0 Other Self Harm Risks: SA Triggers for Past Attempts: Other (Comment)(Death of grandparent, very sick grandmother) Intentional Self Injurious Behavior: None Risk to Others: Homicidal Ideation: No Thoughts of Harm to Others: No Current Homicidal Intent: No Current Homicidal Plan: No Access to Homicidal Means: No Identified Victim: N/A History of harm to others?: No Assessment  of Violence: On admission Violent Behavior Description: N/A Does patient have access to weapons?: No Criminal Charges Pending?: No Does patient have a court date: No Prior Inpatient Therapy: Prior Inpatient Therapy: Yes(Pt was at [facility] approx 2 yrs ago for cutting self) Prior Therapy Dates: Approx 2 years ago Prior Therapy Facilty/Provider(s): [facility] Reason for Treatment: Self-Harm Prior Outpatient Therapy: Prior Outpatient Therapy: No Prior Therapy Dates: N/A Prior Therapy Facilty/Provider(s): N/A Reason for Treatment: N/A Does patient have an ACCT team?: No Does patient have Intensive In-House Services?  : No Does patient have [facility] services? : No Does patient have P4[facility] services?: No  Past Medical History:  Past Medical History:  Diagnosis Date  . Bipolar 1 disorder ([place])    History reviewed. No pertinent surgical history. Family History: No family history on file. Family Psychiatric  History: none Social History:  Social History   Substance and Sexual Activity  Alcohol Use No   Comment: reports he does not drink     Social History   Substance and Sexual Activity  Drug Use Yes  . Types: Marijuana    Social History   Socioeconomic History  . Marital status: Single    Spouse name: None  . Number of children: None  . Years of education: None  . Highest education level: None  Social Needs  . Financial resource strain: None  . Food insecurity - worry: None  . Food insecurity - inability: None  . Transportation needs - medical: None  . Transportation needs - non-medical: None  Occupational History  . None  Tobacco Use  . Smoking status: Current Every  Day Smoker    Packs/day: 0.50    Types: Cigarettes  . Smokeless tobacco: Never Used  Substance and Sexual Activity  . Alcohol use: No    Comment: reports he does not drink  . Drug use: Yes    Types: Marijuana  . Sexual activity: None  Other Topics Concern  . None  Social History Narrative   ** Merged History  Encounter **       Additional Social History: N/A    Allergies:  No Known Allergies  Labs:  Results for orders placed or performed during the hospital encounter of 05/20/17 (from the past 48 hour(s))  Comprehensive metabolic panel     Status: Abnormal   Collection Time: 05/20/17  9:00 PM  Result Value Ref Range   Sodium 136 135 - 145 mmol/L   Potassium 5.8 (H) 3.5 - 5.1 mmol/L   Chloride 103 101 - 111 mmol/L   CO2 23 22 - 32 mmol/L   Glucose, Bld 93 65 - 99 mg/dL   BUN 9 6 - 20 mg/dL   Creatinine, Ser 1.10 0.61 - 1.24 mg/dL   Calcium 9.3 8.9 - 10.3 mg/dL   Total Protein 8.5 (H) 6.5 - 8.1 g/dL   Albumin 4.3 3.5 - 5.0 g/dL   AST 26 15 - 41 U/L   ALT 14 (L) 17 - 63 U/L   Alkaline Phosphatase 65 38 - 126 U/L   Total Bilirubin 0.8 0.3 - 1.2 mg/dL   GFR calc non Af Amer >60 >60 mL/min   GFR calc Af Amer >60 >60 mL/min    Comment: (NOTE) The eGFR has been calculated using the CKD EPI equation. This calculation has not been validated in all clinical situations. eGFR's persistently <60 mL/min signify possible Chronic Kidney Disease.    Anion gap 10 5 - 15    Comment: Performed at [facility], [place] [address] [place], [place] [address]  Ethanol     Status: None   Collection Time: 05/20/17  9:00 PM  Result Value Ref Range   Alcohol, Ethyl (B) <10 <10 mg/dL    Comment:        LOWEST DETECTABLE LIMIT FOR SERUM ALCOHOL IS 10 mg/dL FOR MEDICAL PURPOSES ONLY Performed at [facility], [place] [address] [place], [place] [address]   Urine rapid drug screen (hosp performed)     Status: Abnormal   Collection Time: 05/20/17  9:00 PM  Result Value Ref Range   Opiates NONE DETECTED NONE DETECTED   Cocaine NONE DETECTED NONE DETECTED   Benzodiazepines NONE DETECTED NONE DETECTED   Amphetamines NONE DETECTED NONE DETECTED   Tetrahydrocannabinol POSITIVE (A) NONE DETECTED   Barbiturates NONE DETECTED NONE DETECTED    Comment: (NOTE) DRUG SCREEN FOR  MEDICAL PURPOSES ONLY.  IF CONFIRMATION IS NEEDED FOR ANY PURPOSE, NOTIFY LAB WITHIN 5 DAYS. LOWEST DETECTABLE LIMITS FOR URINE DRUG SCREEN Drug Class                     Cutoff (ng/mL) Amphetamine and metabolites    1000 Barbiturate and metabolites    200 Benzodiazepine                 878 Tricyclics and metabolites     300 Opiates and metabolites        300 Cocaine and metabolites        300 THC  50 Performed at [facility], [place] [address], [place], [place] [address]   Urinalysis, Routine w reflex microscopic     Status: Abnormal   Collection Time: 05/20/17  9:00 PM  Result Value Ref Range   Color, Urine YELLOW YELLOW   APPearance CLEAR CLEAR   Specific Gravity, Urine 1.027 1.005 - 1.030   pH 6.0 5.0 - 8.0   Glucose, UA NEGATIVE NEGATIVE mg/dL   Hgb urine dipstick NEGATIVE NEGATIVE   Bilirubin Urine NEGATIVE NEGATIVE   Ketones, ur 5 (A) NEGATIVE mg/dL   Protein, ur 100 (A) NEGATIVE mg/dL   Nitrite NEGATIVE NEGATIVE   Leukocytes, UA NEGATIVE NEGATIVE   RBC / HPF 0-5 0 - 5 RBC/hpf   WBC, UA 0-5 0 - 5 WBC/hpf   Bacteria, UA RARE (A) NONE SEEN   Squamous Epithelial / LPF 0-5 (A) NONE SEEN   Mucus PRESENT     Comment: Performed at [facility], [place] [address] [place], [place] [address]  CBC with Differential     Status: Abnormal   Collection Time: 05/20/17  9:00 PM  Result Value Ref Range   WBC 22.5 (H) 4.0 - 10.5 K/uL   RBC 4.87 4.22 - 5.81 MIL/uL   Hemoglobin 15.5 13.0 - 17.0 g/dL   HCT 43.6 39.0 - 52.0 %   MCV 89.5 78.0 - 100.0 fL   MCH 31.8 26.0 - 34.0 pg   MCHC 35.6 30.0 - 36.0 g/dL   RDW 12.8 11.5 - 15.5 %   Platelets 306 150 - 400 K/uL   Neutrophils Relative % 83 %   Neutro Abs 18.7 (H) 1.7 - 7.7 K/uL   Lymphocytes Relative 6 %   Lymphs Abs 1.3 0.7 - 4.0 K/uL   Monocytes Relative 11 %   Monocytes Absolute 2.4 (H) 0.1 - 1.0 K/uL   Eosinophils Relative 0 %   Eosinophils Absolute 0.0 0.0 - 0.7  K/uL   Basophils Relative 0 %   Basophils Absolute 0.0 0.0 - 0.1 K/uL    Comment: Performed at [facility], [place] [address] [place], [place] [address]  Lipase, blood     Status: None   Collection Time: 05/20/17  9:00 PM  Result Value Ref Range   Lipase 30 11 - 51 U/L    Comment: Performed at [facility], [place] [address], [place], [place] [address]  Acetaminophen level     Status: None   Collection Time: 05/20/17  9:00 PM  Result Value Ref Range   Acetaminophen (Tylenol), Serum 12 10 - 30 ug/mL    Comment:        THERAPEUTIC CONCENTRATIONS VARY SIGNIFICANTLY. A RANGE OF 10-30 ug/mL MAY BE AN EFFECTIVE CONCENTRATION FOR MANY PATIENTS. HOWEVER, SOME ARE BEST TREATED AT CONCENTRATIONS OUTSIDE THIS RANGE. ACETAMINOPHEN CONCENTRATIONS >150 ug/mL AT 4 HOURS AFTER INGESTION AND >50 ug/mL AT 12 HOURS AFTER INGESTION ARE OFTEN ASSOCIATED WITH TOXIC REACTIONS. Performed at [facility], [place] [address], [place], [place] [address]   Salicylate level     Status: None   Collection Time: 05/20/17  9:00 PM  Result Value Ref Range   Salicylate Lvl <4.8 2.8 - 30.0 mg/dL    Comment: Performed at [facility], [place] [address], [place], [place] [address]  Influenza panel by PCR (type A & B)     Status: None   Collection Time: 05/20/17  9:30 PM  Result Value Ref Range   Influenza A By  PCR NEGATIVE NEGATIVE   Influenza B By PCR NEGATIVE NEGATIVE    Comment: (NOTE) The Xpert Xpress Flu assay is intended as an aid in the diagnosis of  influenza and should not be used as a sole basis for treatment.  This  assay is FDA approved for nasopharyngeal swab specimens only. Nasal  washings and aspirates are unacceptable for Xpert Xpress Flu testing. Performed at [facility], [place] [address], [place], [place] [address]   CBC     Status: None   Collection Time: 05/22/17 10:14 AM  Result Value Ref Range   WBC  6.1 4.0 - 10.5 K/uL   RBC 4.61 4.22 - 5.81 MIL/uL   Hemoglobin 14.5 13.0 - 17.0 g/dL   HCT 41.3 39.0 - 52.0 %   MCV 89.6 78.0 - 100.0 fL   MCH 31.5 26.0 - 34.0 pg   MCHC 35.1 30.0 - 36.0 g/dL   RDW 12.7 11.5 - 15.5 %   Platelets 330 150 - 400 K/uL    Comment: Performed at [facility], [place] [address] [place], [place] [address]    Current Facility-Administered Medications  Medication Dose Route Frequency Provider Last Rate Last Dose  . citalopram (CELEXA) tablet 10 mg  10 mg Oral Daily Money, Darnelle Maffucci B, FNP   10 mg at 05/22/17 1100  . divalproex (DEPAKOTE ER) 24 hr tablet 750 mg  750 mg Oral QHS Money, Travis B, FNP   750 mg at 05/21/17 2112  . doxycycline (VIBRA-TABS) tablet 100 mg  100 mg Oral Q12H Jillyn Ledger, PA-C   100 mg at 05/22/17 1100  . ibuprofen (ADVIL,MOTRIN) tablet 600 mg  600 mg Oral Q8H PRN Maczis, Barth [place], PA-C      . ondansetron [facility]) tablet 4 mg  4 mg Oral Q8H PRN Maczis, Barth [place], PA-C      . traZODone (DESYREL) tablet 50 mg  50 mg Oral QHS PRN Money, Lowry Ram, FNP       Current Outpatient Medications  Medication Sig Dispense Refill  . citalopram (CELEXA) 10 MG tablet Take 1 tablet (10 mg total) by mouth daily. For depression (Patient not taking: Reported on 05/20/2017) 30 tablet 0  . divalproex (DEPAKOTE ER) 250 MG 24 hr tablet Take 3 tablets (750 mg total) by mouth at bedtime. (Patient not taking: Reported on 05/20/2017) 90 tablet 0  . ibuprofen (ADVIL,MOTRIN) 800 MG tablet Take 1 tablet (800 mg total) by mouth 3 (three) times daily. (Patient not taking: Reported on 05/20/2017) 21 tablet 0  . nicotine (NICODERM CQ - DOSED IN MG/24 HOURS) 21 mg/24hr patch Place 1 patch (21 mg total) onto the skin daily. For smoking cessation (Patient not taking: Reported on 05/20/2017) 28 patch 0  . ondansetron (ZOFRAN) 4 MG tablet Take 1 tablet (4 mg total) by mouth every 6 (six) hours. (Patient not taking: Reported on 05/20/2017) 12 tablet 0  . traZODone  (DESYREL) 50 MG tablet Take 1 tablet (50 mg total) by mouth at bedtime as needed for sleep. (Patient not taking: Reported on 05/20/2017) 30 tablet 0    Musculoskeletal: Strength & Muscle Tone: within normal limits Gait & Station: normal Patient leans: N/A  Psychiatric Specialty Exam: Physical Exam  Nursing note and vitals reviewed. Constitutional: He is oriented to person, place, and time. He appears well-developed and well-nourished.  HENT:  Head: Normocephalic and atraumatic.  Neck: Normal range of motion.  Respiratory: Effort normal.  Musculoskeletal: Normal range of motion.  Neurological: He  is alert and oriented to person, place, and time.  Psychiatric: His speech is normal and behavior is normal. Judgment normal. Cognition and memory are normal. He exhibits a depressed mood. He expresses suicidal ideation. He expresses suicidal plans.    Review of Systems  Psychiatric/Behavioral: Positive for depression, substance abuse and suicidal ideas.  All other systems reviewed and are negative.   Blood pressure 114/76, pulse 72, temperature 98.6 F (37 C), temperature source Oral, resp. rate 16, height '5\' 7"'  (1.702 m), weight 63.5 kg (140 lb), SpO2 100 %.Body mass index is 21.93 kg/m.  General Appearance: Casual  Eye Contact:  Good  Speech:  Normal Rate  Volume:  Normal  Mood:  Depressed  Affect:  Congruent  Thought Process:  Coherent and Descriptions of Associations: Intact  Orientation:  Full (Time, Place, and Person)  Thought Content:  WDL and Logical  Suicidal Thoughts:  Yes.  with intent/plan  Homicidal Thoughts:  No  Memory:  Immediate;   Fair Recent;   Fair Remote;   Fair  Judgement:  Fair  Insight:  Fair  Psychomotor Activity:  Normal  Concentration:  Concentration: Fair and Attention Span: Fair  Recall:  [employer] of Knowledge:  Fair  Language:  Good  Akathisia:  No  Handed:  Right  AIMS (if indicated):   N/A  Assets:  Leisure Time Physical Health Resilience   ADL's:  Intact  Cognition:  WNL  Sleep:   N/A     Treatment Plan Summary: Daily contact with patient to assess and evaluate symptoms and progress in treatment, Medication management and Plan major depressive disorder, recurrent, severe without psychosis:  -Crisis stabilization -Medication management:  Medical medications continued along with Depakote 750 mg at bedtime for mood stabilization, Celexa 10 mg daily for depression, and Trazodone 50 mg at bedtime PRN sleep -Individual counseling  Disposition: Recommend psychiatric Inpatient admission when medically cleared.  Waylan Boga, NP 05/22/2017 1:45 PM   Patient seen face-to-face for psychiatric evaluation, chart reviewed and case discussed with the physician extender and developed treatment plan. Reviewed the information documented and agree with the treatment plan.  Buford Dresser, DO 05/22/17 5:30 PM

## 2017-05-22 NOTE — ED Notes (Signed)
Pt has been calm and cooperative with no complaints. He has voiced no complaints, but said that he does not feel as good physically as he usually does. He is able to complete all ADLs independently.

## 2017-05-22 NOTE — Progress Notes (Signed)
Jonathan [name] is a 22 year old male pt admitted on voluntary basis. On admission, he endorses on-going depression and feeling suicidal and reports passive SI on admission but able to contract for safety while in the hospital. He reports the death of his grandmother recently and also his mother is a nursing facility as well as problems in the relationship with his girlfriend as stressors. He reports that he was taking celexa in the past and reports he did go to [facility] management but reports that he has not been on any medications for the past year and a half. He does endorse using THC and occasional alcohol but denies any other substance abuse issue. He reports that he is hopeful that he can go back to living with his girlfriend when he gets discharged. He was oriented to the unit and safety maintained.

## 2017-05-22 NOTE — H&P (Addendum)
Psychiatric Admission Assessment Adult  Patient Identification: Jonathan [name] MRN:  [id number] Date of Evaluation:  05/22/2017 Chief Complaint:  MDD REC SEV;MODERATE CANNABIS USE DISORDER Principal Diagnosis: <principal problem not specified> Diagnosis:   Patient Active Problem List   Diagnosis Date Noted  . Major depressive disorder, recurrent severe without psychotic features ([place]) [F33.2] 05/22/2017  . MDD (major depressive disorder), severe ([place]) [F32.2] 08/28/2015   ID: Jonathan [name] 22 year old male who currently is homeless and " bounce back and forth between girlfriend, friends, and family members. He is currently unemployed, and his last job was [employer] for 4-5 months until the beginning of January. He no longer had transportation to work. He is currently not in school.  CC: My suicidal thoughts and my depression. I was going to cut myself but I was so sick. I laid in the floor with pneumonia. I been having suicidal thoughts for about a month. He has a history of self injurious behaviors, last time cutting was 2 years ago.  His current stressors include recent passing of grandmother ( natural causes), mother currently in ICU, and financial issues. He reports going to see his mother in ICU and when he returned he was overwhelmed.   History of Present Illness: Jonathan [name] is a 22 y.o. male who arrived to the [facility] via EMS due to illness. Upon arrival, pt expressed thoughts of suicide that he has been having since his grandmother's death on 05-09-2017. Pt shared he has felt suicidal in the past but that the feelings were not this strong. He reports his plan was to cut himself.  Pt shares he does not currently have a therapist nor a psychiatrist but that he has made an appointment with a therapist which is for June 06, 2017. Pt states he knows he cannot wait that long, which is why he has requested help today. Pt has no history of MH/SA services, with the exception of when he was  admitted to [facility] approximately 2 years ago for self-harm. Pt estimates that he was cutting himself for a couple of months before he was placed in [facility] and that he never cut himself again. Pt states he was cutting himself for self-harm and that it was not suicidal ideation or attempts.  Pt denies HI, AVH, or self-harm. Pt reports no criminal charges, no court dates, and no probation. He states his mother "drinks excessively" and that she currently lives in a nursing home dur to her COPD. He states he has an older brother and sister for support. He states his father is not a support to him.  Pt shares he lives with his girlfriend and their 59-week-old baby. Pt expresses no concern regarding this relationship or his ability to continue to stay with them.  Pt shares he has been drinking approximately a 6-pack of 12 oz beers and a pint daily as of recent. He reports he has been smoking "too much" marijuana, which he estimates to be several bowls.  Pt is oriented x4. His memory is intact. He is pleasant and expresses wanting help. He is open to inpatient treatment if that is what is deemed necessary for him.  Pt's Psych PA, Frederico Hamman, has approved pt for inpatient treatment. Pt's Medical PA has approved pt to be physically appropriate for inpatient treatment. Pt has been placed on 7 days of antibiotics. Due to medical concerns re: pt's pneumonia, he will be referred to other facilities.   Associated Signs/Symptoms: Depression Symptoms:  depressed mood, anhedonia, hypersomnia,  psychomotor retardation, fatigue, feelings of worthlessness/guilt, hopelessness, suicidal thoughts with specific plan, weight loss, decreased appetite, (Hypo) Manic Symptoms:  Impulsivity, Irritable Mood, Labiality of Mood, Anxiety Symptoms:  Excessive Worry, Psychotic Symptoms:  Denies PTSD Symptoms: Negative Total Time spent with patient: 45 minutes  Past Psychiatric History: MDD, Per chart review BIpolar I  Past  Psych Meds: Celexa - states " it helped, but I stopped taking because I was feeling better".  Had been off it for several months. Depakote ER 522m po QHS  [facility]  [facility]   Drug Use: marijuana -"too much everyday, unable to count."   Legal : None Is the patient at risk to self? Yes.    Has the patient been a risk to self in the past 6 months? Yes.    Has the patient been a risk to self within the distant past? No.  Is the patient a risk to others? No.  Has the patient been a risk to others in the past 6 months? No.  Has the patient been a risk to others within the distant past? No.   Alcohol Screening: 1. How often do you have a drink containing alcohol?: 2 to 3 times a week 2. How many drinks containing alcohol do you have on a typical day when you are drinking?: 1 or 2 3. How often do you have six or more drinks on one occasion?: Monthly AUDIT-C Score: 5 4. How often during the last year have you found that you were not able to stop drinking once you had started?: Never 5. How often during the last year have you failed to do what was normally expected from you becasue of drinking?: Never 6. How often during the last year have you needed a first drink in the morning to get yourself going after a heavy drinking session?: Never 7. How often during the last year have you had a feeling of guilt of remorse after drinking?: Never 8. How often during the last year have you been unable to remember what happened the night before because you had been drinking?: Never 9. Have you or someone else been injured as a result of your drinking?: No 10. Has a relative or friend or a doctor or another health worker been concerned about your drinking or suggested you cut down?: No Alcohol Use Disorder Identification Test Final Score (AUDIT): 5 Intervention/Follow-up: AUDIT Score <7 follow-up not indicated Substance Abuse History in the last 12 months:   Yes.   Consequences of Substance Abuse: Legal Consequences:  jail time  Past Medical History:  Past Medical History:  Diagnosis Date  . Bipolar 1 disorder ([place]    History reviewed. No pertinent surgical history. Family History: parents alive, separated, limited contact with father, has two siblings  Family Psychiatric  History:  No mental illness in family, no suicides in family, mother has history of alcohol abuse  Tobacco Screening: Have you used any form of tobacco in the last 30 days? (Cigarettes, Smokeless Tobacco, Cigars, and/or Pipes): Yes Tobacco use, Select all that apply: 4 or less cigarettes per day Are you interested in Tobacco Cessation Medications?: No, patient refused Counseled patient on smoking cessation including recognizing danger situations, developing coping skills and basic information about quitting provided: Refused/Declined practical counseling Social History:  Social History   Substance and Sexual Activity  Alcohol Use Yes   Comment: occasional     Social History   Substance and Sexual Activity  Drug Use Yes  . Types: Marijuana    Additional Social History:   Allergies:  No Known Allergies Lab Results:  Results for orders placed or performed during the hospital encounter of 05/20/17 (from the past 48 hour(s))  Comprehensive metabolic panel     Status: Abnormal   Collection Time: 05/20/17  9:00 PM  Result Value Ref Range   Sodium 136 135 - 145 mmol/L   Potassium 5.8 (H) 3.5 - 5.1 mmol/L   Chloride 103 101 - 111 mmol/L   CO2 23 22 - 32 mmol/L   Glucose, Bld 93 65 - 99 mg/dL   BUN 9 6 - 20 mg/dL   Creatinine, Ser 1.10 0.61 - 1.24 mg/dL   Calcium 9.3 8.9 - 10.3 mg/dL   Total Protein 8.5 (H) 6.5 - 8.1 g/dL   Albumin 4.3 3.5 - 5.0 g/dL   AST 26 15 - 41 U/L   ALT 14 (L) 17 - 63 U/L   Alkaline Phosphatase 65 38 - 126 U/L   Total Bilirubin 0.8 0.3 - 1.2 mg/dL   GFR calc non Af Amer >60 >60 mL/min   GFR calc Af Amer >60 >60 mL/min    Comment:  (NOTE) The eGFR has been calculated using the CKD EPI equation. This calculation has not been validated in all clinical situations. eGFR's persistently <60 mL/min signify possible Chronic Kidney Disease.    Anion gap 10 5 - 15    Comment: Performed at [facility], [place] [address] [place], [place] [address]  Ethanol     Status: None   Collection Time: 05/20/17  9:00 PM  Result Value Ref Range   Alcohol, Ethyl (B) <10 <10 mg/dL    Comment:        LOWEST DETECTABLE LIMIT FOR SERUM ALCOHOL IS 10 mg/dL FOR MEDICAL PURPOSES ONLY Performed at [facility], [place] [address], [place], [place] [address]   Urine rapid drug screen (hosp performed)     Status: Abnormal   Collection Time: 05/20/17  9:00 PM  Result Value Ref Range   Opiates NONE DETECTED NONE DETECTED   Cocaine NONE DETECTED NONE DETECTED   Benzodiazepines NONE DETECTED NONE DETECTED   Amphetamines NONE DETECTED NONE DETECTED   Tetrahydrocannabinol POSITIVE (A) NONE DETECTED   Barbiturates NONE DETECTED NONE DETECTED    Comment: (NOTE) DRUG SCREEN FOR MEDICAL PURPOSES ONLY.  IF CONFIRMATION IS NEEDED FOR ANY PURPOSE, NOTIFY LAB WITHIN 5 DAYS. LOWEST DETECTABLE LIMITS FOR URINE DRUG SCREEN Drug Class                     Cutoff (ng/mL) Amphetamine and metabolites    1000 Barbiturate and metabolites    200 Benzodiazepine                 716 Tricyclics and metabolites     300 Opiates and metabolites        300 Cocaine and metabolites        300 THC                            50 Performed at [facility], [place] [address], [place], [place] [address]   Urinalysis, Routine w reflex microscopic     Status: Abnormal   Collection Time: 05/20/17  9:00 PM  Result Value Ref Range   Color, Urine YELLOW YELLOW   APPearance CLEAR CLEAR   Specific Gravity, Urine 1.027  1.005 - 1.030   pH 6.0 5.0 - 8.0   Glucose, UA NEGATIVE NEGATIVE mg/dL   Hgb urine dipstick NEGATIVE NEGATIVE    Bilirubin Urine NEGATIVE NEGATIVE   Ketones, ur 5 (A) NEGATIVE mg/dL   Protein, ur 100 (A) NEGATIVE mg/dL   Nitrite NEGATIVE NEGATIVE   Leukocytes, UA NEGATIVE NEGATIVE   RBC / HPF 0-5 0 - 5 RBC/hpf   WBC, UA 0-5 0 - 5 WBC/hpf   Bacteria, UA RARE (A) NONE SEEN   Squamous Epithelial / LPF 0-5 (A) NONE SEEN   Mucus PRESENT     Comment: Performed at [facility], [place] [address] [place], [place] [address]  CBC with Differential     Status: Abnormal   Collection Time: 05/20/17  9:00 PM  Result Value Ref Range   WBC 22.5 (H) 4.0 - 10.5 K/uL   RBC 4.87 4.22 - 5.81 MIL/uL   Hemoglobin 15.5 13.0 - 17.0 g/dL   HCT 43.6 39.0 - 52.0 %   MCV 89.5 78.0 - 100.0 fL   MCH 31.8 26.0 - 34.0 pg   MCHC 35.6 30.0 - 36.0 g/dL   RDW 12.8 11.5 - 15.5 %   Platelets 306 150 - 400 K/uL   Neutrophils Relative % 83 %   Neutro Abs 18.7 (H) 1.7 - 7.7 K/uL   Lymphocytes Relative 6 %   Lymphs Abs 1.3 0.7 - 4.0 K/uL   Monocytes Relative 11 %   Monocytes Absolute 2.4 (H) 0.1 - 1.0 K/uL   Eosinophils Relative 0 %   Eosinophils Absolute 0.0 0.0 - 0.7 K/uL   Basophils Relative 0 %   Basophils Absolute 0.0 0.0 - 0.1 K/uL    Comment: Performed at [facility], [place] [address], [place], [place] [address]  Lipase, blood     Status: None   Collection Time: 05/20/17  9:00 PM  Result Value Ref Range   Lipase 30 11 - 51 U/L    Comment: Performed at [facility], [place] [address] [place], [place] [address]  Acetaminophen level     Status: None   Collection Time: 05/20/17  9:00 PM  Result Value Ref Range   Acetaminophen (Tylenol), Serum 12 10 - 30 ug/mL    Comment:        THERAPEUTIC CONCENTRATIONS VARY SIGNIFICANTLY. A RANGE OF 10-30 ug/mL MAY BE AN EFFECTIVE CONCENTRATION FOR MANY PATIENTS. HOWEVER, SOME ARE BEST TREATED AT CONCENTRATIONS OUTSIDE THIS RANGE. ACETAMINOPHEN CONCENTRATIONS >150 ug/mL AT 4 HOURS AFTER INGESTION AND >50 ug/mL AT 12 HOURS  AFTER INGESTION ARE OFTEN ASSOCIATED WITH TOXIC REACTIONS. Performed at [facility], [place] [address] [place], [place] [address]   Salicylate level     Status: None   Collection Time: 05/20/17  9:00 PM  Result Value Ref Range   Salicylate Lvl <5.3 2.8 - 30.0 mg/dL    Comment: Performed at [facility], [place] [address], [place], [place] [address]  Influenza panel by PCR (type A & B)     Status: None   Collection Time: 05/20/17  9:30 PM  Result Value Ref Range   Influenza A By PCR NEGATIVE NEGATIVE   Influenza B By PCR NEGATIVE NEGATIVE    Comment: (NOTE) The Xpert Xpress Flu assay is intended as an aid in the diagnosis of  influenza and should not be used as a sole basis for treatment.  This  assay is FDA approved for nasopharyngeal swab specimens only. Nasal  washings and aspirates are unacceptable for Xpert Xpress Flu testing. Performed at [facility], [place] [address] [place], [place] [address]   CBC     Status: None   Collection Time: 05/22/17 10:14 AM  Result Value Ref Range   WBC 6.1 4.0 - 10.5 K/uL   RBC 4.61 4.22 - 5.81 MIL/uL   Hemoglobin 14.5 13.0 - 17.0 g/dL   HCT 41.3 39.0 - 52.0 %   MCV 89.6 78.0 - 100.0 fL   MCH 31.5 26.0 - 34.0 pg   MCHC 35.1 30.0 - 36.0 g/dL   RDW 12.7 11.5 - 15.5 %   Platelets 330 150 - 400 K/uL    Comment: Performed at [facility], [place] [address] [place], [place] [address]    Blood Alcohol level:  Lab Results  Component Value Date   ETH <10 05/20/2017   ETH <5 84/16/6063    Metabolic Disorder Labs:  No results found for: HGBA1C, MPG No results found for: PROLACTIN No results found for: CHOL, TRIG, HDL, CHOLHDL, VLDL, LDLCALC  Current Medications: Current Facility-Administered Medications  Medication Dose Route Frequency Provider Last Rate Last Dose  . acetaminophen (TYLENOL) tablet 650 mg  650 mg Oral Q6H PRN Patrecia Pour, NP      . alum & mag  hydroxide-simeth (MAALOX/MYLANTA) 200-200-20 MG/5ML suspension 30 mL  30 mL Oral Q4H PRN Patrecia Pour, NP      . Derrill Memo ON 05/23/2017] citalopram (CELEXA) tablet 10 mg  10 mg Oral Daily Patrecia Pour, NP      . divalproex (DEPAKOTE ER) 24 hr tablet 750 mg  750 mg Oral QHS Patrecia Pour, NP      . doxycycline (VIBRA-TABS) tablet 100 mg  100 mg Oral Q12H Lord, Asa Saunas, NP      . ibuprofen (ADVIL,MOTRIN) tablet 600 mg  600 mg Oral Q8H PRN Patrecia Pour, NP      . magnesium hydroxide (MILK OF MAGNESIA) suspension 30 mL  30 mL Oral Daily PRN Patrecia Pour, NP      . ondansetron [facility]) tablet 4 mg  4 mg Oral Q8H PRN Patrecia Pour, NP      . traZODone (DESYREL) tablet 50 mg  50 mg Oral QHS PRN Patrecia Pour, NP       PTA Medications: Medications Prior to Admission  Medication Sig Dispense Refill Last Dose  . citalopram (CELEXA) 10 MG tablet Take 1 tablet (10 mg total) by mouth daily. For depression (Patient not taking: Reported on 05/20/2017) 30 tablet 0 Not Taking at Unknown time  . divalproex (DEPAKOTE ER) 250 MG 24 hr tablet Take 3 tablets (750 mg total) by mouth at bedtime. (Patient not taking: Reported on 05/20/2017) 90 tablet 0 Not Taking at Unknown time  . ibuprofen (ADVIL,MOTRIN) 800 MG tablet Take 1 tablet (800 mg total) by mouth 3 (three) times daily. (Patient not taking: Reported on 05/20/2017) 21 tablet 0 Not Taking at Unknown time  . nicotine (NICODERM CQ - DOSED IN MG/24 HOURS) 21 mg/24hr patch Place 1 patch (21 mg total) onto the skin daily. For smoking cessation (Patient not taking: Reported on 05/20/2017) 28 patch 0 Not Taking at Unknown time  . ondansetron (ZOFRAN) 4 MG tablet Take 1 tablet (4 mg total) by mouth every 6 (six) hours. (Patient not taking: Reported on 05/20/2017) 12 tablet 0 Not Taking at Unknown time  . traZODone (DESYREL) 50 MG tablet Take 1 tablet (50 mg  total) by mouth at bedtime as needed for sleep. (Patient not taking: Reported on 05/20/2017) 30 tablet 0  Not Taking at Unknown time    Musculoskeletal: Strength & Muscle Tone: within normal limits Gait & Station: normal Patient leans: N/A  Psychiatric Specialty Exam: Physical Exam  ROS  Blood pressure 111/68, pulse 68, temperature 98.4 F (36.9 C), temperature source Oral, resp. rate 18, height '5\' 8"'  (1.727 m), weight 58.5 kg (129 lb).Body mass index is 19.61 kg/m.  General Appearance: Fairly Groomed, Guarded and thin frame and wearing scrubs  Eye Contact:  Minimal  Speech:  Clear and Coherent and Slow  Volume:  Decreased  Mood:  Depressed and Worthless  Affect:  Depressed and Flat  Thought Process:  Linear and Descriptions of Associations: Intact  Orientation:  Full (Time, Place, and Person)  Thought Content:  Logical  Suicidal Thoughts:  No  Homicidal Thoughts:  No  Memory:  Immediate;   Fair Recent;   Fair  Judgement:  Fair  Insight:  Lacking  Psychomotor Activity:  Psychomotor Retardation  Concentration:  Concentration: Poor and Attention Span: Fair  Recall:  Poor  Fund of Knowledge:  Fair  Language:  Fair  Akathisia:  No  Handed:  Right  AIMS (if indicated):     Assets:  Communication Skills Desire for Improvement Leisure Time Physical Health Social Support  ADL's:  Intact  Cognition:  WNL  Sleep:       Treatment Plan Summary: Daily contact with patient to assess and evaluate symptoms and progress in treatment and Medication management 1 Admit for crisis management and stabilization.  2. Medication management to reduce symptoms to baseline and improved the patient's overall level of functioning. Closely monitor the side effects, efficacy and therapeutic response of medication.  Medication Management: Celexa 30m po daily for depression. Will start Depakote 7511mpo daily. Will obtain Depakote 05/26/2016, and increase to therapeutic level.  3. Treat health problem as indicated. Diagnosed with pneumonia- Will continue with Doxycycline 10078mo BID x 7 days.  4.  Developed treatment plan to decrease the risk of relapse upon discharge and to reduce the need for readmission.  5. Psychosocial education regarding relapse prevention in self-care.  6. Healthcare followup as needed for medical problems and called consults as indicated.  7. Increase collateral information.  8. Restart home medication where appropriate  9. Encouraged to participate and verbalize into group milieu therapy.  1.  Observation Level/Precautions:  15 minute checks  Laboratory:  Labs obtained in the ED have been assessed and reviewed. Will obtain additional labs if needed.   Psychotherapy:  Individual and group therapy  Medications:  See above  Consultations:  Per need  Discharge Concerns:  Drug use, Mother is gravely ill in ICU  Estimated LOS:3-5 days  Other:     Physician Treatment Plan for Primary Diagnosis: <principal problem not specified> Long Term Goal(s): Improvement in symptoms so as ready for discharge  Short Term Goals: Ability to identify changes in lifestyle to reduce recurrence of condition will improve, Ability to verbalize feelings will improve, Ability to disclose and discuss suicidal ideas and Ability to demonstrate self-control will improve  Physician Treatment Plan for Secondary Diagnosis: Active Problems:   Major depressive disorder, recurrent severe without psychotic features ([place]CC[place]cherLong Term Goal(s): Improvement in symptoms so as ready for discharge  Short Term Goals: Ability to identify and develop effective coping behaviors will improve, Ability to maintain clinical measurements within normal limits will improve, Compliance with prescribed medications  will improve and Ability to identify triggers associated with substance abuse/mental health issues will improve  I certify that inpatient services furnished can reasonably be expected to improve the patient's condition.    Nanci Pina, FNP 2/20/20194:42 PM   Agree with NP Progress Note

## 2017-05-22 NOTE — Progress Notes (Signed)
Patient laying in bed resting.  Patient denied SI and HI, contracts for safety.  Patient stated that he did have SI thoughts, but not at this time.  Denied A/V hallucinations.  Denied pain.  Patient stated he feels very tired and wants to sleep at this time.  Respirations even and unlabored.  No signs/symptoms of pain/distress on patient's face/body movements.  Safety maintained with 15 minute checks.

## 2017-05-23 DIAGNOSIS — J189 Pneumonia, unspecified organism: Secondary | ICD-10-CM

## 2017-05-23 DIAGNOSIS — G4700 Insomnia, unspecified: Secondary | ICD-10-CM

## 2017-05-23 DIAGNOSIS — F1290 Cannabis use, unspecified, uncomplicated: Secondary | ICD-10-CM

## 2017-05-23 DIAGNOSIS — F39 Unspecified mood [affective] disorder: Secondary | ICD-10-CM

## 2017-05-23 DIAGNOSIS — R45851 Suicidal ideations: Secondary | ICD-10-CM

## 2017-05-23 LAB — BASIC METABOLIC PANEL
Anion gap: 11 (ref 5–15)
BUN: 9 mg/dL (ref 6–20)
CHLORIDE: 106 mmol/L (ref 101–111)
CO2: 22 mmol/L (ref 22–32)
CREATININE: 0.96 mg/dL (ref 0.61–1.24)
Calcium: 9.2 mg/dL (ref 8.9–10.3)
GFR calc Af Amer: >60 mL/min (ref >60)
GFR calc non Af Amer: >60 mL/min (ref >60)
Glucose, Bld: 83 mg/dL (ref 65–99)
Potassium: 4.3 mmol/L (ref 3.5–5.1)
Sodium: 139 mmol/L (ref 135–145)

## 2017-05-23 MED ORDER — IBUPROFEN 600 MG PO TABS
600.0000 mg | ORAL_TABLET | Freq: Three times a day (TID) | ORAL | Status: DC | PRN
  Administered 2017-05-23 – 2017-05-26 (×2): 600 mg via ORAL
  Filled 2017-05-23 (×2): qty 1

## 2017-05-23 NOTE — [facility] Suicide Risk Assessment (Signed)
[facility] INPATIENT:  Family/Significant Other Suicide Prevention Education  Suicide Prevention Education:  Education Completed; Renaye RakersDiesha Umland [phone] (Sister) has been identified by the patient as the family member/significant other with whom the patient will be residing, and identified as the person(s) who will aid the patient in the event of a mental health crisis (suicidal ideations/suicide attempt).  With written consent from the patient, the family member/significant other has been provided the following suicide prevention education, prior to the and/or following the discharge of the patient.  The suicide prevention education provided includes the following:  Suicide risk factors  Suicide prevention and interventions  National Suicide Hotline telephone number  [facility] assessment telephone number  [facility] Emergency Assistance 911  [facility] and/or Residential Mobile Crisis Unit telephone number  Request made of family/significant other to:  Remove weapons (e.g., guns, rifles, knives), all items previously/currently identified as safety concern.    Remove drugs/medications (over-the-counter, prescriptions, illicit drugs), all items previously/currently identified as a safety concern.  The family member/significant other verbalizes understanding of the suicide prevention education information provided.  The family member/significant other agrees to remove the items of safety concern listed above.  Renaye RakersDiesha Menz states that their grandmother passed less than a month ago and there mother was placed in the hospital with the flu approximately a week ago.  The doctors have told the family that she may not live much longer and Piedad ClimesDiesha would like her brother to be released soon so he may see his mother before she passes away.  She states that their grandmother was the first major loss of a family member for her brother and that she does not believe he has had  time to grieve.    Metro Kungngel M Carlyne Keehan 05/23/2017, 2:34 PM

## 2017-05-23 NOTE — [facility] Group Notes (Signed)
LCSW Group Therapy Note 05/23/2017 12:56 PM  Type of Therapy/Topic: Group Therapy: Emotion Regulation  Participation Level: Minimal   Description of Group:  The purpose of this group is to assist patients in learning to regulate negative emotions and experience positive emotions. Patients will be guided to discuss ways in which they have been vulnerable to their negative emotions. These vulnerabilities will be juxtaposed with experiences of positive emotions or situations, and patients will be challenged to use positive emotions to combat negative ones. Special emphasis will be placed on coping with negative emotions in conflict situations, and patients will process healthy conflict resolution skills.  Therapeutic Goals: 1. Patient will identify two positive emotions or experiences to reflect on in order to balance out negative emotions 2. Patient will label two or more emotions that they find the most difficult to experience 3. Patient will demonstrate positive conflict resolution skills through discussion and/or role plays  Summary of Patient Progress:  Jonathan [name] was engaged throughout the group session. He participated and contributed to the group's discussion.   Therapeutic Modalities:  Cognitive Behavioral Therapy Feelings Identification Dialectical Behavioral Therapy   Jonathan [name] LCSWA Clinical Social Worker

## 2017-05-23 NOTE — Progress Notes (Signed)
Nursing Progress Note 1900-0730  D) Patient presents with flat affect but brightens upon approach. Patient did attend group and was seen interactive in the milieu. Patient denies SI/HI/AVH or pain. Patient contracts for safety on the unit. Patient states, "I had some SI thoughts this morning but I don't now". Patient requests medication for sleep and is compliant with medications.   A) Patient educated about and provided medication as scheduled or requested per provider's orders. Patient safety maintained with q15 min safety checks. Low fall risk precautions in place. Emotional support given. 1:1 interaction and active listening provided. Snacks and fluids provided. Labs, vital signs and patient behavior monitored throughout shift. Patient encouraged to work on treatment plan.  R) Patient remains safe on the unit at this time. Patient agrees to make needs known to staff. Will continue to monitor and assess for changes.

## 2017-05-23 NOTE — Progress Notes (Signed)
Pt attended evening wrap up group and stated today was a 8, he said his high was going outside and getting fresh air. If he could go anywhere he would like to go to [place].

## 2017-05-23 NOTE — [facility] Suicide Risk Assessment (Signed)
[facility] INPATIENT:  Family/Significant Other Suicide Prevention Education  Suicide Prevention Education:  Education Completed; Yves DillSunnia Jennette [phone] (Girlfriend) has been identified by the patient as the family member/significant other with whom the patient will be residing, and identified as the person(s) who will aid the patient in the event of a mental health crisis (suicidal ideations/suicide attempt).  With written consent from the patient, the family member/significant other has been provided the following suicide prevention education, prior to the and/or following the discharge of the patient.  The suicide prevention education provided includes the following:  Suicide risk factors  Suicide prevention and interventions  National Suicide Hotline telephone number  [facility] assessment telephone number  [facility] Emergency Assistance 911  [facility] and/or Residential Mobile Crisis Unit telephone number  Request made of family/significant other to:  Remove weapons (e.g., guns, rifles, knives), all items previously/currently identified as safety concern.    Remove drugs/medications (over-the-counter, prescriptions, illicit drugs), all items previously/currently identified as a safety concern.  The family member/significant other verbalizes understanding of the suicide prevention education information provided.  The family member/significant other agrees to remove the items of safety concern listed above.  Mrs. Ricke HeyJennette states that Wei's mother is sick and [facility].  She also confirms that his grandmother passed away less than a month ago.  She does not feel that he is handling the grieving process well.    Metro Kungngel M Kiyon Fidalgo 05/23/2017, 2:56 PM

## 2017-05-23 NOTE — Progress Notes (Addendum)
[facility] MD Progress Note  05/23/2017 1:32 PM Deep Bonawitz  MRN:  [id number]   Subjective:  Patient reports that he is doing good today. He reports sleeping good and having a good appetite. He denies any medication side effects. He reports passive SI, but he denies any HI/AVH and contracts for safety. He rates his depression at 5/10 and his anxiety at 0/10.   Objective: Patient's charta and findings reviewed and discussed with treatment team. Patient presents in the hallway on the way to the gym. He is pleasant and cooperative. He has been seen interacting appropriately. He appears depressed and has a flat affect. Reviewed labs and K+ is decreased to 4.3   Principal Problem: Major depressive disorder, recurrent severe without psychotic features (Edith Endave) Diagnosis:   Patient Active Problem List   Diagnosis Date Noted  . Major depressive disorder, recurrent severe without psychotic features ([place]) [F33.2] 05/22/2017   Total Time spent with patient: 15 minutes  Past Psychiatric History: See H&P  Past Medical History:  Past Medical History:  Diagnosis Date  . Bipolar 1 disorder ([place])    History reviewed. No pertinent surgical history. Family History: History reviewed. No pertinent family history. Family Psychiatric  History: See H&P Social History:  Social History   Substance and Sexual Activity  Alcohol Use Yes   Comment: occasional     Social History   Substance and Sexual Activity  Drug Use Yes  . Types: Marijuana    Social History   Socioeconomic History  . Marital status: Single    Spouse name: None  . Number of children: None  . Years of education: None  . Highest education level: None  Social Needs  . Financial resource strain: None  . Food insecurity - worry: None  . Food insecurity - inability: None  . Transportation needs - medical: None  . Transportation needs - non-medical: None  Occupational History  . None  Tobacco Use  . Smoking status: Current Every Day  Smoker    Packs/day: 0.50    Types: Cigarettes  . Smokeless tobacco: Never Used  Substance and Sexual Activity  . Alcohol use: Yes    Comment: occasional  . Drug use: Yes    Types: Marijuana  . Sexual activity: Yes    Birth control/protection: Condom  Other Topics Concern  . None  Social History Narrative   ** Merged History Encounter **       Additional Social History:                         Sleep: Good  Appetite:  Good  Current Medications: Current Facility-Administered Medications  Medication Dose Route Frequency Provider Last Rate Last Dose  . acetaminophen (TYLENOL) tablet 650 mg  650 mg Oral Q6H PRN Patrecia Pour, NP      . alum & mag hydroxide-simeth (MAALOX/MYLANTA) 200-200-20 MG/5ML suspension 30 mL  30 mL Oral Q4H PRN Patrecia Pour, NP      . citalopram (CELEXA) tablet 10 mg  10 mg Oral Daily Patrecia Pour, NP   10 mg at 05/23/17 1696  . divalproex (DEPAKOTE ER) 24 hr tablet 750 mg  750 mg Oral QHS Patrecia Pour, NP   750 mg at 05/22/17 2156  . doxycycline (VIBRA-TABS) tablet 100 mg  100 mg Oral Q12H Patrecia Pour, NP   100 mg at 05/23/17 7893  . ibuprofen (ADVIL,MOTRIN) tablet 600 mg  600 mg Oral Q8H PRN Leanord Hawking  A, MD   600 mg at 05/23/17 1213  . magnesium hydroxide (MILK OF MAGNESIA) suspension 30 mL  30 mL Oral Daily PRN Patrecia Pour, NP      . ondansetron [facility]) tablet 4 mg  4 mg Oral Q8H PRN Patrecia Pour, NP      . traZODone (DESYREL) tablet 50 mg  50 mg Oral QHS PRN Patrecia Pour, NP   50 mg at 05/22/17 2156    Lab Results:  Results for orders placed or performed during the hospital encounter of 05/22/17 (from the past 48 hour(s))  Basic metabolic panel     Status: None   Collection Time: 05/23/17  6:17 AM  Result Value Ref Range   Sodium 139 135 - 145 mmol/L   Potassium 4.3 3.5 - 5.1 mmol/L   Chloride 106 101 - 111 mmol/L   CO2 22 22 - 32 mmol/L   Glucose, Bld 83 65 - 99 mg/dL   BUN 9 6 - 20 mg/dL   Creatinine,  Ser 0.96 0.61 - 1.24 mg/dL   Calcium 9.2 8.9 - 10.3 mg/dL   GFR calc non Af Amer >60 >60 mL/min   GFR calc Af Amer >60 >60 mL/min    Comment: (NOTE) The eGFR has been calculated using the CKD EPI equation. This calculation has not been validated in all clinical situations. eGFR's persistently <60 mL/min signify possible Chronic Kidney Disease.    Anion gap 11 5 - 15    Comment: Performed at [facility], [place] [address], [place], [place] [address]    Blood Alcohol level:  Lab Results  Component Value Date   ETH <10 05/20/2017   ETH <5 53/66/4403    Metabolic Disorder Labs: No results found for: HGBA1C, MPG No results found for: PROLACTIN No results found for: CHOL, TRIG, HDL, CHOLHDL, VLDL, LDLCALC  Physical Findings: AIMS: Facial and Oral Movements Muscles of Facial Expression: None, normal Lips and Perioral Area: None, normal Jaw: None, normal Tongue: None, normal,Extremity Movements Upper (arms, wrists, hands, fingers): None, normal Lower (legs, knees, ankles, toes): None, normal, Trunk Movements Neck, shoulders, hips: None, normal, Overall Severity Severity of abnormal movements (highest score from questions above): None, normal Incapacitation due to abnormal movements: None, normal Patient's awareness of abnormal movements (rate only patient's report): No Awareness, Dental Status Current problems with teeth and/or dentures?: No Does patient usually wear dentures?: No  CIWA:    COWS:     Musculoskeletal: Strength & Muscle Tone: within normal limits Gait & Station: normal Patient leans: N/A  Psychiatric Specialty Exam: Physical Exam  Nursing note and vitals reviewed. Constitutional: He is oriented to person, place, and time. He appears well-developed and well-nourished.  Cardiovascular: Normal rate.  Respiratory: Effort normal.  Musculoskeletal: Normal range of motion.  Neurological: He is alert and oriented to person, place, and time.   Skin: Skin is warm.    Review of Systems  Constitutional: Negative.   HENT: Negative.   Eyes: Negative.   Respiratory: Negative.   Cardiovascular: Negative.   Gastrointestinal: Negative.   Genitourinary: Negative.   Musculoskeletal: Negative.   Skin: Negative.   Neurological: Negative.   Endo/Heme/Allergies: Negative.   Psychiatric/Behavioral: Positive for depression and suicidal ideas. Negative for hallucinations. The patient is not nervous/anxious.     Blood pressure 129/70, pulse 76, temperature 97.9 F (36.6 C), temperature source Oral, resp. rate 18, height '5\' 8"'  (1.727 m), weight 58.5 kg (129 lb).Body mass index is 19.61 kg/m.  General  Appearance: Casual  Eye Contact:  Good  Speech:  Clear and Coherent and Normal Rate  Volume:  Normal  Mood:  Depressed  Affect:  Flat  Thought Process:  Goal Directed and Descriptions of Associations: Intact  Orientation:  Full (Time, Place, and Person)  Thought Content:  WDL  Suicidal Thoughts:  Yes.  without intent/plan  Homicidal Thoughts:  No  Memory:  Immediate;   Good Recent;   Good Remote;   Good  Judgement:  Good  Insight:  Good  Psychomotor Activity:  Normal  Concentration:  Concentration: Good and Attention Span: Good  Recall:  Good  Fund of Knowledge:  Good  Language:  Good  Akathisia:  No  Handed:  Right  AIMS (if indicated):     Assets:  Communication Skills Desire for Improvement Financial Resources/Insurance Housing Physical Health Social Support Transportation  ADL's:  Intact  Cognition:  WNL  Sleep:  Number of Hours: 6.75   Problems Addressed: MDD severe Pneumonia  Treatment Plan Summary: Daily contact with patient to assess and evaluate symptoms and progress in treatment, Medication management and Plan is to:  -Continue Depakote ER 750 mg PO QHS for mood stability -Continue Celexa 10 mg PO Daily for mood stability -Continue Doxycycline 100 mg PO Q12H for pneumonia -Continue Trazodone 50 mg PO QHS  PRN for insomnia -Encourage group therapy participation  Lewis Shock, FNP 05/23/2017, 1:32 PM   Agree with NP Progress Note

## 2017-05-23 NOTE — Progress Notes (Signed)
CSW spoke with the patient to discuss possible discharge plans. CSW asked the patient if he was interested in [facility].   Jonathan [name] stated that he did not want to [facility] because his girlfriend is going to need help with their 515-week old baby and that she does not have any other supports.   Jonathan [name] reports that he does not think he will return to his girlfriend's home, and will be essentially homeless at discharge. The patient did not appear to be concerned about not having an  CSW informed Jonathan [name] that housing/shelter resources would be provided. Patient has been [facility]/17 at 8:00am.  CSW will continue to follow.     Baldo DaubJolan Taavi Hoose, MSW, LCSWA Clinical Social Worker [facility]  Phone: [phone]

## 2017-05-23 NOTE — Progress Notes (Signed)
D: Patient is unsure where he will go upon discharge, as he does not plan to return to his girlfriend's house.  He has a follow up with [facility] on 2/26.  Patient has been pleasant with staff and admits to having "fleeting thoughts of self harm."  He does not endorse a specific plan and contracts for safety on the unit.  Patient rates his depression as a 7; hopelessness as a 2; anxiety as an 8.  His goal is to work on "my suicide thoughts and depression."  Patient presents with flat, blunted affect and depressed mood.  He complains of some lower back pain, which he was given ibuprofen.    A: Continue to monitor medication management and MD orders.  Safety checks completed every 15 minutes per protocol.  Offer support and encouragement as needed.  R: Patient is receptive to staff; his behavior is appropriate.

## 2017-05-23 NOTE — [facility] Counselor (Signed)
Adult Comprehensive Assessment  Patient ID: Jonathan [name], male   DOB: 01/08/1996, 22 y.o.   MRN: [id number] Information Source: Information source: Patient  Current Stressors:  Educational / Learning stressors: No Employment / Job issues: Yes. Trying to find a job Family Relationships: yes. Family does not have a relaitonship because of person he is dating.  Financial / Lack of resources (include bankruptcy): No Housing / Lack of housing: Yes. Exploring options.  Physical health (include injuries & life threatening diseases): No Social relationships: yes. Issues with girlfriend, was living with her and cannot return.  Substance abuse: Patient reports smoking cannabis on a daily basis. Denies any other substance use.  Bereavement / Loss: No  Living/Environment/Situation:  Living Arrangements: With girlfriend and 51-week old son. Patient does not know if he is returning to his girlfriend's home at discharge. CSW will continue to follow.  Living conditions (as described by patient or guardian): "okay" How long has patient lived in current situation?: 3 years   Family History:  Marital status: Single; Patient reports he does not know if he and his girlfriend will continue he relationship.  Are you sexually active?: Yes What is your sexual orientation?: heterosexual Has your sexual activity been affected by drugs, alcohol, medication, or emotional stress?: No Does patient have children?:Yes, 25-week old son.   Childhood History:  By whom was/is the patient raised?: Father Description of patient's relationship with caregiver when they were a child: It was okay Patient's description of current relationship with people who raised him/her: Don't talk much, but goes to see him.  How were you disciplined when you got in trouble as a child/adolescent?: "whooping" that was it Does patient have siblings?: Yes Number of Siblings: 2 Description of patient's current relationship with siblings:  Relationship with brother is okay. Sister doesn't talk to him because of his girlfriend Did patient suffer any verbal/emotional/physical/sexual abuse as a child?: No Did patient suffer from severe childhood neglect?: No Has patient ever been sexually abused/assaulted/raped as an adolescent or adult?: No Was the patient ever a victim of a crime or a disaster?: No Witnessed domestic violence?: No Has patient been effected by domestic violence as an adult?: No  Education:  Highest grade of school patient has completed: 11th Grade Currently a student?: Yes Name of school: [employer] How long has the patient attended?: 3 - 4 months Learning disability?: No  Employment/Work Situation:   Employment situation: Unemployed Patient's job has been impacted by current illness: Yes Describe how patient's job has been impacted: Missed work at last job because he wasn't going due to low mood What is the longest time patient has a held a job?: 6 months Where was the patient employed at that time?: [employer] Has patient ever been in the [employer]?: No Has patient ever served in combat?: No Did You Receive Any Psychiatric Treatment/Services While in Equities trader?: No Are There Guns or Other Weapons in Your Home?: No  Financial Resources:   Financial resources: No income Does patient have a Lawyer or guardian?: No  Alcohol/Substance Abuse:   What has been your use of drugs/alcohol within the last 12 months?: Marijuana about 2x weekly.  If attempted suicide, did drugs/alcohol play a role in this?: No Alcohol/Substance Abuse Treatment Hx: Denies past history Has alcohol/substance abuse ever caused legal problems?: No  Social Support System:   Conservation officer, nature Support System: Fair Development worker, community Support System: Only has girlfriend Type of faith/religion: No  Leisure/Recreation:   Leisure and  Hobbies: Play basketbal  Strengths/Needs:   What things does the patient  do well?: Draw In what areas does patient struggle / problems for patient: School, difficult to concentrate at times  Discharge Plan:   Does patient have access to transportation?: Yes Will patient be returning to same living situation after discharge?: No Plan for living situation after discharge: Needs housing resources and informaiton for shelters Currently [facility] Does patient have financial barriers related to discharge medications?: Yes Patient description of barriers related to discharge medications: No income and no insurance  Summary/Recommendations:   Summary and Recommendations (to be completed by the evaluator): Jonathan [name] is a 22 year old male who is diagnosed with Major depressive disorder, Recurrent episode, Moderate. He presented to the hospital, seeking treatment for depression and suicidal ideation. During the assessment, Jonathan [name] was groggy, however was able to provide information.Jonathan [name] reports that he experienced an increase in depression since the death of his grandmother and that he and his girlfriend have not been getting along lately. Jonathan [name] reports prior to coming to the hospital, he was living with his girlfriend and their 765-week old baby boy. Jonathan [name] reports he does not know if he is returning to his girlfriend's home at discharge. He also states that he would like to continue to [facility] because he "does not have any money". [name] can benefit from crisis stabilization, medication management, therapeutic milieu and referral services.    Jonathan [name]. 05/23/2017

## 2017-05-24 MED ORDER — CITALOPRAM HYDROBROMIDE 20 MG PO TABS
20.0000 mg | ORAL_TABLET | Freq: Every day | ORAL | Status: DC
  Administered 2017-05-25 – 2017-05-27 (×3): 20 mg via ORAL
  Filled 2017-05-24 (×4): qty 1

## 2017-05-24 MED ORDER — INFLUENZA VAC SPLIT HIGH-DOSE 0.5 ML IM SUSY: 0.5000 mL | PREFILLED_SYRINGE | INTRAMUSCULAR | Status: DC

## 2017-05-24 MED ORDER — INFLUENZA VAC SPLIT HIGH-DOSE 0.5 ML IM SUSY
0.5000 mL | PREFILLED_SYRINGE | INTRAMUSCULAR | Status: DC
  Filled 2017-05-24: qty 0.5

## 2017-05-24 MED ORDER — PNEUMOCOCCAL VAC POLYVALENT 25 MCG/0.5ML IJ INJ: 0.5000 mL | INJECTION | INTRAMUSCULAR | Status: DC

## 2017-05-24 NOTE — Progress Notes (Signed)
D: Pt passive SI-contracts for safety, denies HI/ AVH. Pt is pleasant and cooperative. Pt said he felt better due to meds working and his depression is better  A: Pt was offered support and encouragement. Pt was given scheduled medications. Pt was encourage to attend groups. Q 15 minute checks were done for safety.   R:Pt attends groups and interacts well with peers and staff. Pt is taking medication. Pt has no complaints.Pt receptive to treatment and safety maintained on unit.

## 2017-05-24 NOTE — Progress Notes (Signed)
Recreation Therapy Notes  Date: 05/24/17 Time: 0930 Location: 300 Hall Dayroom  Group Topic: Stress Management  Goal Area(s) Addresses:  Patient will verbalize importance of using healthy stress management.  Patient will identify positive emotions associated with healthy stress management.   Behavioral Response: Engaged  Intervention: Stress Management  Activity :  Meditation.  LRT introduced the stress management technique of meditation.  LRT played a meditation from the Calm app the focused on gratitude.  Patients were to listen and follow along with the meditation as it played.  Education:  Stress Management, Discharge Planning.   Education Outcome: Acknowledges edcuation/In group clarification offered/Needs additional education  Clinical Observations/Feedback: Pt attended group.    Kaitlan Bin, LRT/CTRS         Aashrith Eves A 05/24/2017 11:18 AM 

## 2017-05-24 NOTE — [facility] Group Notes (Signed)
LCSW Group Therapy Note 05/24/2017 2:22 PM  Type of Therapy/Topic: Group Therapy: Balance in Life  Participation Level: Minimal  Description of Group:  This group will address the concept of balance and how it feels and looks when one is unbalanced. Patients will be encouraged to process areas in their lives that are out of balance and identify reasons for remaining unbalanced. Facilitators will guide patients in utilizing problem-solving interventions to address and correct the stressor making their life unbalanced. Understanding and applying boundaries will be explored and addressed for obtaining and maintaining a balanced life. Patients will be encouraged to explore ways to assertively make their unbalanced needs known to significant others in their lives, using other group members and facilitator for support and feedback.  Therapeutic Goals: 1. Patient will identify two or more emotions or situations they have that consume much of in their lives. 2. Patient will identify signs/triggers that life has become out of balance:  3. Patient will identify two ways to set boundaries in order to achieve balance in their lives:  4. Patient will demonstrate ability to communicate their needs through discussion and/or role plays  Summary of Patient Progress:  Jonathan [name] was engaged during his stay. He participated and contributed to the group's discussion, before leaving to go to the restroom. Jonathan [name] never returned to the group session.     Therapeutic Modalities:  Cognitive Behavioral Therapy Solution-Focused Therapy Assertiveness Training   Jonathan [name] LCSWA Clinical Social Worker

## 2017-05-24 NOTE — Progress Notes (Signed)
D: Pt passive SI-contracts for safety. Pt is pleasant and cooperative. Pt stated he felt little better  A: Pt was offered support and encouragement. Pt was given scheduled medications. Pt was encourage to attend groups. Q 15 minute checks were done for safety.   R:Pt attends groups and interacts well with peers and staff. Pt is taking medication. Pt has no complaints.Pt receptive to treatment and safety maintained on unit.

## 2017-05-24 NOTE — Tx Team (Signed)
Interdisciplinary Treatment and Diagnostic Plan Update  05/24/2017 Time of Session: 9:30am Demarlo Riojas MRN: [id number]  Principal Diagnosis: Major depressive disorder, recurrent severe without psychotic features (HCC)  Secondary Diagnoses: Principal Problem:   Major depressive disorder, recurrent severe without psychotic features (HCC) Active Problems:   Pneumonia   Current Medications:  Current Facility-Administered Medications  Medication Dose Route Frequency Provider Last Rate Last Dose  . acetaminophen (TYLENOL) tablet 650 mg  650 mg Oral Q6H PRN Charm Rings, NP      . alum & mag hydroxide-simeth (MAALOX/MYLANTA) 200-200-20 MG/5ML suspension 30 mL  30 mL Oral Q4H PRN Charm Rings, NP      . citalopram (CELEXA) tablet 10 mg  10 mg Oral Daily Charm Rings, NP   10 mg at 05/23/17 4098  . divalproex (DEPAKOTE ER) 24 hr tablet 750 mg  750 mg Oral QHS Charm Rings, NP   750 mg at 05/23/17 2154  . doxycycline (VIBRA-TABS) tablet 100 mg  100 mg Oral Q12H Charm Rings, NP   100 mg at 05/23/17 2154  . ibuprofen (ADVIL,MOTRIN) tablet 600 mg  600 mg Oral Q8H PRN Georgiann Cocker, MD   600 mg at 05/23/17 1213  . magnesium hydroxide (MILK OF MAGNESIA) suspension 30 mL  30 mL Oral Daily PRN Charm Rings, NP      . ondansetron [facility]) tablet 4 mg  4 mg Oral Q8H PRN Charm Rings, NP      . traZODone (DESYREL) tablet 50 mg  50 mg Oral QHS PRN Charm Rings, NP   50 mg at 05/23/17 2154   PTA Medications: Medications Prior to Admission  Medication Sig Dispense Refill Last Dose  . citalopram (CELEXA) 10 MG tablet Take 1 tablet (10 mg total) by mouth daily. For depression (Patient not taking: Reported on 05/20/2017) 30 tablet 0 Not Taking at Unknown time  . divalproex (DEPAKOTE ER) 250 MG 24 hr tablet Take 3 tablets (750 mg total) by mouth at bedtime. (Patient not taking: Reported on 05/20/2017) 90 tablet 0 Not Taking at Unknown time  . ibuprofen (ADVIL,MOTRIN) 800 MG  tablet Take 1 tablet (800 mg total) by mouth 3 (three) times daily. (Patient not taking: Reported on 05/20/2017) 21 tablet 0 Not Taking at Unknown time  . nicotine (NICODERM CQ - DOSED IN MG/24 HOURS) 21 mg/24hr patch Place 1 patch (21 mg total) onto the skin daily. For smoking cessation (Patient not taking: Reported on 05/20/2017) 28 patch 0 Not Taking at Unknown time  . ondansetron (ZOFRAN) 4 MG tablet Take 1 tablet (4 mg total) by mouth every 6 (six) hours. (Patient not taking: Reported on 05/20/2017) 12 tablet 0 Not Taking at Unknown time  . traZODone (DESYREL) 50 MG tablet Take 1 tablet (50 mg total) by mouth at bedtime as needed for sleep. (Patient not taking: Reported on 05/20/2017) 30 tablet 0 Not Taking at Unknown time    Patient Stressors: Loss of grandmother Marital or family conflict Medication change or noncompliance Substance abuse  Patient Strengths: Ability for insight Average or above average intelligence Capable of independent living General fund of knowledge Motivation for treatment/growth  Treatment Modalities: Medication Management, Group therapy, Case management,  1 to 1 session with clinician, Psychoeducation, Recreational therapy.   Physician Treatment Plan for Primary Diagnosis: Major depressive disorder, recurrent severe without psychotic features (HCC) Long Term Goal(s): Improvement in symptoms so as ready for discharge Improvement in symptoms so as ready for discharge   Short  Term Goals: Ability to identify changes in lifestyle to reduce recurrence of condition will improve Ability to verbalize feelings will improve Ability to disclose and discuss suicidal ideas Ability to demonstrate self-control will improve Ability to identify and develop effective coping behaviors will improve Ability to maintain clinical measurements within normal limits will improve Compliance with prescribed medications will improve Ability to identify triggers associated with substance  abuse/mental health issues will improve  Medication Management: Evaluate patient's response, side effects, and tolerance of medication regimen.  Therapeutic Interventions: 1 to 1 sessions, Unit Group sessions and Medication administration.  Evaluation of Outcomes: Progressing.  Physician Treatment Plan for Secondary Diagnosis: Principal Problem:   Major depressive disorder, recurrent severe without psychotic features (HCC) Active Problems:   Pneumonia  Long Term Goal(s): Improvement in symptoms so as ready for discharge Improvement in symptoms so as ready for discharge   Short Term Goals: Ability to identify changes in lifestyle to reduce recurrence of condition will improve Ability to verbalize feelings will improve Ability to disclose and discuss suicidal ideas Ability to demonstrate self-control will improve Ability to identify and develop effective coping behaviors will improve Ability to maintain clinical measurements within normal limits will improve Compliance with prescribed medications will improve Ability to identify triggers associated with substance abuse/mental health issues will improve     Medication Management: Evaluate patient's response, side effects, and tolerance of medication regimen.  Therapeutic Interventions: 1 to 1 sessions, Unit Group sessions and Medication administration.  Evaluation of Outcomes: Progressing   RN Treatment Plan for Primary Diagnosis: Major depressive disorder, recurrent severe without psychotic features (HCC) Long Term Goal(s): Knowledge of disease and therapeutic regimen to maintain health will improve  Short Term Goals: Ability to remain free from injury will improve, Ability to verbalize frustration and anger appropriately will improve, Ability to demonstrate self-control, Ability to participate in decision making will improve, Ability to verbalize feelings will improve, Ability to disclose and discuss suicidal ideas, Ability to  identify and develop effective coping behaviors will improve and Compliance with prescribed medications will improve  Medication Management: RN will administer medications as ordered by provider, will assess and evaluate patient's response and provide education to patient for prescribed medication. RN will report any adverse and/or side effects to prescribing provider.  Therapeutic Interventions: 1 on 1 counseling sessions, Psychoeducation, Medication administration, Evaluate responses to treatment, Monitor vital signs and CBGs as ordered, Perform/monitor CIWA, COWS, AIMS and Fall Risk screenings as ordered, Perform wound care treatments as ordered.  Evaluation of Outcomes: Progressing   LCSW Treatment Plan for Primary Diagnosis: Major depressive disorder, recurrent severe without psychotic features (HCC) Long Term Goal(s): Safe transition to appropriate next level of care at discharge, Engage patient in therapeutic group addressing interpersonal concerns.  Short Term Goals: Engage patient in aftercare planning with referrals and resources, Increase social support, Increase ability to appropriately verbalize feelings, Increase emotional regulation, Facilitate acceptance of mental health diagnosis and concerns, Facilitate patient progression through stages of change regarding substance use diagnoses and concerns, Identify triggers associated with mental health/substance abuse issues and Increase skills for wellness and recovery  Therapeutic Interventions: Assess for all discharge needs, 1 to 1 time with Social worker, Explore available resources and support systems, Assess for adequacy in community support network, Educate family and significant other(s) on suicide prevention, Complete Psychosocial Assessment, Interpersonal group therapy.  Evaluation of Outcomes: Progressing   Progress in Treatment: Attending groups: Yes. Participating in groups: Yes. Taking medication as prescribed:  Yes. Toleration medication: Yes. Family/Significant  other contact made: Yes, individual(s) contacted:  the patient's girlfriend Patient understands diagnosis: Yes. Discussing patient identified problems/goals with staff: Yes. Medical problems stabilized or resolved: Yes. Denies suicidal/homicidal ideation: Yes. Issues/concerns per patient self-inventory: No. Other:   New problem(s) identified: None  New Short Term/Long Term Goal(s): medication stabilization, elimination of SI thoughts, development of comprehensive mental wellness plan.   Patient Goal:  Discharge Plan or Barriers: Patient plans to return home with his girlfriend temporarily, until he can find another place. Patient will follow up with [facility] at discharge.   Reason for Continuation of Hospitalization: Anxiety Depression Medication stabilization  Estimated Length of Stay: Monday, 05/27/17  Attendees: Patient: Jonathan ArntDeandre Kelemen 05/24/2017 8:39 AM  Physician: Dr. Nehemiah MassedFernando Cobos 05/24/2017 8:39 AM  Nursing: Boyd KerbsPenny, RN 05/24/2017 8:39 AM  RN Care Manager: Juliann ParesX 05/24/2017 8:39 AM  Social Worker: Baldo DaubJolan Karsen Fellows, Theresia MajorsLCSWA 05/24/2017 8:39 AM  Recreational Therapist: Juliann ParesX 05/24/2017 8:39 AM  Other:  05/24/2017 8:39 AM  Other:  05/24/2017 8:39 AM  Other: 05/24/2017 8:39 AM    Scribe for Treatment Team: Maeola SarahJolan E Tyriana Helmkamp, LCSWA 05/24/2017 8:39 AM

## 2017-05-24 NOTE — Plan of Care (Signed)
  Safety: Periods of time without injury will increase 05/24/2017 0001 - Progressing by Delos HaringPhillips, Shaquinta Peruski A, RN Note Pt safe on the unit at this time

## 2017-05-24 NOTE — Plan of Care (Signed)
  Safety: Ability to disclose and discuss suicidal ideas will improve 05/24/2017 2311 - Progressing by Delos HaringPhillips, Tailynn Armetta A, RN Note Pt passive SI-contracts for safety

## 2017-05-24 NOTE — Progress Notes (Signed)
[facility] MD Progress Note  05/24/2017 4:08 PM Jonathan [name]  MRN:  [id number]   Subjective:  Reports he remains depressed, sad, but acknowledges he is feeling partially better, and feels he is improving physically as well ( recent pneumonia).  States he has had intermittent passive thoughts of death, " like thinking of dying ", but denies plans or intentions of hurting self and contracts for safety on unit . Denies coughing , denies fever, denies dyspnea at room air, no chest or pleuritic pain, denies dizziness or lightheadedness. States appetite is fair but improving.  Objective: I have discussed case with treatment team and have met with patient. Patient presents with partial improvement compared to admission- endorses partially improved mood although still presents depressed and endorses intermittent passive SI. Denies any psychotic symptoms. As above, he states he is feeling better physically as well, as he convalesces from respiratory infection/pneumonia. Denies medication side effects ( On Depakote ER for explosiveness, angry outbursts, and on Celexa for depression). Has been visible on unit, going to groups, calm and polite on approach.    Principal Problem: Major depressive disorder, recurrent severe without psychotic features ([place]) Diagnosis:   Patient Active Problem List   Diagnosis Date Noted  . Pneumonia [J18.9] 05/23/2017  . Major depressive disorder, recurrent severe without psychotic features ([place]) [F33.2] 05/22/2017   Total Time spent with patient: 15 minutes  Past Psychiatric History: See H&P  Past Medical History:  Past Medical History:  Diagnosis Date  . Bipolar 1 disorder ([place])    History reviewed. No pertinent surgical history. Family History: History reviewed. No pertinent family history. Family Psychiatric  History: See H&P Social History:  Social History   Substance and Sexual Activity  Alcohol Use Yes   Comment: occasional     Social History    Substance and Sexual Activity  Drug Use Yes  . Types: Marijuana    Social History   Socioeconomic History  . Marital status: Single    Spouse name: None  . Number of children: None  . Years of education: None  . Highest education level: None  Social Needs  . Financial resource strain: None  . Food insecurity - worry: None  . Food insecurity - inability: None  . Transportation needs - medical: None  . Transportation needs - non-medical: None  Occupational History  . None  Tobacco Use  . Smoking status: Current Every Day Smoker    Packs/day: 0.50    Types: Cigarettes  . Smokeless tobacco: Never Used  Substance and Sexual Activity  . Alcohol use: Yes    Comment: occasional  . Drug use: Yes    Types: Marijuana  . Sexual activity: Yes    Birth control/protection: Condom  Other Topics Concern  . None  Social History Narrative   ** Merged History Encounter **       Additional Social History:   Sleep: Good  Appetite:  fair- improving   Current Medications: Current Facility-Administered Medications  Medication Dose Route Frequency Provider Last Rate Last Dose  . acetaminophen (TYLENOL) tablet 650 mg  650 mg Oral Q6H PRN Patrecia Pour, NP      . alum & mag hydroxide-simeth (MAALOX/MYLANTA) 200-200-20 MG/5ML suspension 30 mL  30 mL Oral Q4H PRN Patrecia Pour, NP      . citalopram (CELEXA) tablet 10 mg  10 mg Oral Daily Patrecia Pour, NP   10 mg at 05/24/17 0913  . divalproex (DEPAKOTE ER) 24 hr tablet 750 mg  750 mg Oral QHS Patrecia Pour, NP   750 mg at 05/23/17 2154  . doxycycline (VIBRA-TABS) tablet 100 mg  100 mg Oral Q12H Patrecia Pour, NP   100 mg at 05/24/17 2992  . ibuprofen (ADVIL,MOTRIN) tablet 600 mg  600 mg Oral Q8H PRN Artist Beach, MD   600 mg at 05/23/17 1213  . [START ON 05/28/2017] Influenza vac split quadrivalent PF (FLUZONE HIGH-DOSE) injection 0.5 mL  0.5 mL Intramuscular Tomorrow-1000 Nwoko, Agnes I, NP      . magnesium hydroxide  (MILK OF MAGNESIA) suspension 30 mL  30 mL Oral Daily PRN Patrecia Pour, NP      . ondansetron [facility]) tablet 4 mg  4 mg Oral Q8H PRN Patrecia Pour, NP      . Derrill Memo ON 05/28/2017] pneumococcal 23 valent vaccine (PNU-IMMUNE) injection 0.5 mL  0.5 mL Intramuscular Tomorrow-1000 Nwoko, Agnes I, NP      . traZODone (DESYREL) tablet 50 mg  50 mg Oral QHS PRN Patrecia Pour, NP   50 mg at 05/23/17 2154    Lab Results:  Results for orders placed or performed during the hospital encounter of 05/22/17 (from the past 48 hour(s))  Basic metabolic panel     Status: None   Collection Time: 05/23/17  6:17 AM  Result Value Ref Range   Sodium 139 135 - 145 mmol/L   Potassium 4.3 3.5 - 5.1 mmol/L   Chloride 106 101 - 111 mmol/L   CO2 22 22 - 32 mmol/L   Glucose, Bld 83 65 - 99 mg/dL   BUN 9 6 - 20 mg/dL   Creatinine, Ser 0.96 0.61 - 1.24 mg/dL   Calcium 9.2 8.9 - 10.3 mg/dL   GFR calc non Af Amer >60 >60 mL/min   GFR calc Af Amer >60 >60 mL/min    Comment: (NOTE) The eGFR has been calculated using the CKD EPI equation. This calculation has not been validated in all clinical situations. eGFR's persistently <60 mL/min signify possible Chronic Kidney Disease.    Anion gap 11 5 - 15    Comment: Performed at [facility], [place] [address] [place], [place] [address]    Blood Alcohol level:  Lab Results  Component Value Date   ETH <10 05/20/2017   ETH <5 41/96/2229    Metabolic Disorder Labs: No results found for: HGBA1C, MPG No results found for: PROLACTIN No results found for: CHOL, TRIG, HDL, CHOLHDL, VLDL, LDLCALC  Physical Findings: AIMS: Facial and Oral Movements Muscles of Facial Expression: None, normal Lips and Perioral Area: None, normal Jaw: None, normal Tongue: None, normal,Extremity Movements Upper (arms, wrists, hands, fingers): None, normal Lower (legs, knees, ankles, toes): None, normal, Trunk Movements Neck, shoulders, hips: None, normal, Overall  Severity Severity of abnormal movements (highest score from questions above): None, normal Incapacitation due to abnormal movements: None, normal Patient's awareness of abnormal movements (rate only patient's report): No Awareness, Dental Status Current problems with teeth and/or dentures?: No Does patient usually wear dentures?: No  CIWA:    COWS:     Musculoskeletal: Strength & Muscle Tone: within normal limits Gait & Station: normal Patient leans: N/A  Psychiatric Specialty Exam: Physical Exam  Nursing note and vitals reviewed. Constitutional: He is oriented to person, place, and time. He appears well-developed and well-nourished.  Cardiovascular: Normal rate.  Respiratory: Effort normal.  Musculoskeletal: Normal range of motion.  Neurological: He is alert and oriented to person, place, and time.  Skin: Skin is warm.    Review of Systems  Constitutional: Negative.   HENT: Negative.   Eyes: Negative.   Respiratory: Negative.   Cardiovascular: Negative.   Gastrointestinal: Negative.   Genitourinary: Negative.   Musculoskeletal: Negative.   Skin: Negative.   Neurological: Negative.   Endo/Heme/Allergies: Negative.   Psychiatric/Behavioral: Positive for depression and suicidal ideas. Negative for hallucinations. The patient is not nervous/anxious.   denies cough or shortness of breath, no pleuritic or chest pain, no fever   Blood pressure 131/81, pulse 69, temperature 98.2 F (36.8 C), temperature source Oral, resp. rate 16, height '5\' 8"'  (1.727 m), weight 58.5 kg (129 lb).Body mass index is 19.61 kg/m.  General Appearance: improved grooming   Eye Contact:  Good  Speech:  Normal Rate  Volume:  Normal  Mood:  remains depressed, but states he is feeling better   Affect:  still constricted but more reactive   Thought Process:  Linear and Descriptions of Associations: Intact  Orientation:  Other:  fully alert and attentive  Thought Content:  no hallucinations, no delusions  , not internally preoccupied   Suicidal Thoughts:  No denies suicidal or self injurious ideations, denies homicidal or violent ideations   Homicidal Thoughts:  No  Memory:  recent and remote grossly intact   Judgement:  Fair-improving   Insight:  Fair- improving   Psychomotor Activity:  Normal  Concentration:  Concentration: Good and Attention Span: Good  Recall:  Good  Fund of Knowledge:  Good  Language:  Good  Akathisia:  No  Handed:  Right  AIMS (if indicated):     Assets:  Communication Skills Desire for Improvement Financial Resources/Insurance Housing Physical Health Social Support Transportation  ADL's:  Intact  Cognition:  WNL  Sleep:  Number of Hours: 6.75   Assessment - patient reports gradually improving depression, but remains depressed, constricted and reports intermittent passive SI, although contracts for safety on unit. No acute respiratory symptoms or fever, and does not appear to be in any acute distress . Currently on Celexa and on Depakote ER ( for history of intermittent explosiveness), and tolerating medications well thus far.  Treatment Plan Summary: Treatment plan reviewed as below today 2/22 Daily contact with patient to assess and evaluate symptoms and progress in treatment, Medication management and Plan is to:  -Continue Depakote ER 750 mg PO QHS for mood stability -Increase  Celexa to 20  mg PO Daily for mood stability -Continue Doxycycline 100 mg PO Q12H for pneumonia -Continue Trazodone 50 mg PO QHS PRN for insomnia -Encourage group therapy participation to work on Radiographer, therapeutic and symptom reduction -Treatment team working on disposition planning options  Jenne Campus, MD 05/24/2017, 4:08 PM   Patient ID: Jonathan [name], male   DOB: 1996-03-13, 22 y.o.   MRN: [id number]

## 2017-05-24 NOTE — Progress Notes (Signed)
Data. Patient denies HI/AVH. Patient endorses passive SI, "I woke up thinking about it today, but I don't have any plans to hurt myself."Patient reports, "The thoughts just float in and out at times."Verbally contracts for safety on the unit and to come to staff before acting of any self harm thoughts/feelings.  Patient interacting well with staff and other patients. Patient's affect is flat, but brightens with interaction. On his self assessment he reports 0/10 for depression and hopelessness and 7/10 for anxiety. His goal for today is, "Depression." Action. Emotional support and encouragement offered. Education provided on medication, indications and side effect. Q 15 minute checks done for safety. Response. Safety on the unit maintained through 15 minute checks.  Medications taken as prescribed. Attended groups. Remained calm and appropriate through out shift.

## 2017-05-24 NOTE — Plan of Care (Signed)
Patient has participated in most unit activities, this shift. Patient ha had no injuries this shift. Patient ha been able to verbalize his feelings in an appropriate manner.

## 2017-05-25 NOTE — Plan of Care (Signed)
  Coping: Ability to cope will improve 05/25/2017 2306 - Progressing by Delos HaringPhillips, Leighana Neyman A, RN Note Pt denies SI at this time

## 2017-05-25 NOTE — [facility] Group Notes (Signed)
LCSW Group Therapy Note  05/25/2017 9:30-10:30AM - [address], 10:30-11:30 - 400 [address], 11:30-12:00 - 500 Hall  Type of Therapy and Topic:  Group Therapy: Anger Cues and Responses  Participation Level:  Did Not Attend   Description of Group:   In this group, patients learned how to recognize the physical, cognitive, emotional, and behavioral responses they have to anger-provoking situations.  They identified a recent time they became angry and how they reacted.  They analyzed how their reaction was possibly beneficial and how it was possibly unhelpful.  The group discussed a variety of healthier coping skills that could help with such a situation in the future.  Deep breathing was practiced briefly.  Therapeutic Goals: 1. Patients will remember their last incident of anger and how they felt emotionally and physically, what their thoughts were at the time, and how they behaved. 2. Patients will identify how their behavior at that time worked for them, as well as how it worked against them. 3. Patients will explore possible new behaviors to use in future anger situations. 4. Patients will learn that anger itself is normal and cannot be eliminated, and that healthier reactions can assist with resolving conflict rather than worsening situations.  Summary of Patient Progress:  N/A  Therapeutic Modalities:   Cognitive Behavioral Therapy  Lynnell ChadMareida J Grossman-Orr  05/25/2017 8:26 AM

## 2017-05-25 NOTE — Progress Notes (Signed)
D: Pt denies SI/HI/AVH. Pt is pleasant and cooperative. Pt stated he was feeling more depress after hearing bad news about his mother, she had to be put on breathing machine. Pt stated he was feeling less depressed   A: Pt was offered support and encouragement. Pt was given scheduled medications. Pt was encourage to attend groups. Q 15 minute checks were done for safety.   R:Pt attends groups and interacts well with peers and staff. Pt is taking medication. Pt has no complaints.Pt receptive to treatment and safety maintained on unit.

## 2017-05-25 NOTE — Progress Notes (Signed)
D. Pt presents with a sad affect with appropriate behavior, observed interacting appropriately with peers in milieu this am. Per pt's self inventory, pt rates his depression, hopelessness and anxiety an 8/0/7, respectively. Pt denies pain and respiratory symptoms. Pt writes that his goal to work on today is "depression".Pt currently denies SI/HI and AVH and agrees to contact staff before acting on any harmful thoughts.  A. Labs and vitals monitored. Pt compliant with medications. Pt supported emotionally and encouraged to express concerns and ask questions.   R. Pt remains safe with 15 minute checks. Will continue POC.

## 2017-05-25 NOTE — Progress Notes (Addendum)
[facility] MD Progress Note  05/25/2017 1:04 PM Jonathan [name]  MRN:  [id number]   Subjective:  Patient reports that he is doing good today. He has made arrangements to go back to his girlfriends house for 2-3 days and help out with his child. He is still refusing residential treatment, but agrees to outpatient treatment. He denies any SI/HI/AVH and contracts for safety. He reports good sleep and appetite.   Objective: Patient's chart and findings reviewed and discussed with treatment team. patient presents in his bed, but his cooperative and pleasant,. He has been somewhat isolative but has also been seen attending some groups and interacting appropriately. Will continue current treatment and hopeful discharge in next 1-2 days. He is showing improvement, but it is very gradual improvement.    Principal Problem: Major depressive disorder, recurrent severe without psychotic features (HCC) Diagnosis:   Patient Active Problem List   Diagnosis Date Noted  . Pneumonia [J18.9] 05/23/2017  . Major depressive disorder, recurrent severe without psychotic features (HCC) [F33.2] 05/22/2017   Total Time spent with patient: 15 minutes  Past Psychiatric History: See H&P  Past Medical History:  Past Medical History:  Diagnosis Date  . Bipolar 1 disorder (HCC)    History reviewed. No pertinent surgical history. Family History: History reviewed. No pertinent family history. Family Psychiatric  History: See H&P Social History:  Social History   Substance and Sexual Activity  Alcohol Use Yes   Comment: occasional     Social History   Substance and Sexual Activity  Drug Use Yes  . Types: Marijuana    Social History   Socioeconomic History  . Marital status: Single    Spouse name: None  . Number of children: None  . Years of education: None  . Highest education level: None  Social Needs  . Financial resource strain: None  . Food insecurity - worry: None  . Food insecurity - inability: None   . Transportation needs - medical: None  . Transportation needs - non-medical: None  Occupational History  . None  Tobacco Use  . Smoking status: Current Every Day Smoker    Packs/day: 0.50    Types: Cigarettes  . Smokeless tobacco: Never Used  Substance and Sexual Activity  . Alcohol use: Yes    Comment: occasional  . Drug use: Yes    Types: Marijuana  . Sexual activity: Yes    Birth control/protection: Condom  Other Topics Concern  . None  Social History Narrative   ** Merged History Encounter **       Additional Social History:   Sleep: Good  Appetite:  Good  Current Medications: Current Facility-Administered Medications  Medication Dose Route Frequency Provider Last Rate Last Dose  . acetaminophen (TYLENOL) tablet 650 mg  650 mg Oral Q6H PRN Charm Rings, NP      . alum & mag hydroxide-simeth (MAALOX/MYLANTA) 200-200-20 MG/5ML suspension 30 mL  30 mL Oral Q4H PRN Charm Rings, NP      . citalopram (CELEXA) tablet 20 mg  20 mg Oral Daily Rebekah Zackery, Rockey Situ, MD   20 mg at 05/25/17 0810  . divalproex (DEPAKOTE ER) 24 hr tablet 750 mg  750 mg Oral QHS Charm Rings, NP   750 mg at 05/24/17 2237  . doxycycline (VIBRA-TABS) tablet 100 mg  100 mg Oral Q12H Charm Rings, NP   100 mg at 05/25/17 0810  . ibuprofen (ADVIL,MOTRIN) tablet 600 mg  600 mg Oral Q8H PRN Izediuno, Delight Ovens,  MD   600 mg at 05/23/17 1213  . [START ON 05/28/2017] Influenza vac split quadrivalent PF (FLUZONE HIGH-DOSE) injection 0.5 mL  0.5 mL Intramuscular Tomorrow-1000 Nwoko, Agnes I, NP      . magnesium hydroxide (MILK OF MAGNESIA) suspension 30 mL  30 mL Oral Daily PRN Charm Rings, NP      . ondansetron [facility]) tablet 4 mg  4 mg Oral Q8H PRN Charm Rings, NP      . Melene Muller ON 05/28/2017] pneumococcal 23 valent vaccine (PNU-IMMUNE) injection 0.5 mL  0.5 mL Intramuscular Tomorrow-1000 Nwoko, Agnes I, NP      . traZODone (DESYREL) tablet 50 mg  50 mg Oral QHS PRN Charm Rings, NP   50 mg  at 05/24/17 2238    Lab Results:  No results found for this or any previous visit (from the past 48 hour(s)).  Blood Alcohol level:  Lab Results  Component Value Date   ETH <10 05/20/2017   ETH <5 09/14/2015    Metabolic Disorder Labs: No results found for: HGBA1C, MPG No results found for: PROLACTIN No results found for: CHOL, TRIG, HDL, CHOLHDL, VLDL, LDLCALC  Physical Findings: AIMS: Facial and Oral Movements Muscles of Facial Expression: None, normal Lips and Perioral Area: None, normal Jaw: None, normal Tongue: None, normal,Extremity Movements Upper (arms, wrists, hands, fingers): None, normal Lower (legs, knees, ankles, toes): None, normal, Trunk Movements Neck, shoulders, hips: None, normal, Overall Severity Severity of abnormal movements (highest score from questions above): None, normal Incapacitation due to abnormal movements: None, normal Patient's awareness of abnormal movements (rate only patient's report): No Awareness, Dental Status Current problems with teeth and/or dentures?: No Does patient usually wear dentures?: No  CIWA:    COWS:     Musculoskeletal: Strength & Muscle Tone: within normal limits Gait & Station: normal Patient leans: N/A  Psychiatric Specialty Exam: Physical Exam  Nursing note and vitals reviewed. Constitutional: He is oriented to person, place, and time. He appears well-developed and well-nourished.  Cardiovascular: Normal rate.  Respiratory: Effort normal.  Musculoskeletal: Normal range of motion.  Neurological: He is alert and oriented to person, place, and time.  Skin: Skin is warm.    Review of Systems  Constitutional: Negative.   HENT: Negative.   Eyes: Negative.   Respiratory: Negative.   Cardiovascular: Negative.   Gastrointestinal: Negative.   Genitourinary: Negative.   Musculoskeletal: Negative.   Skin: Negative.   Neurological: Negative.   Endo/Heme/Allergies: Negative.   Psychiatric/Behavioral: Positive for  depression. Negative for hallucinations. The patient is not nervous/anxious.   denies cough or shortness of breath, no pleuritic or chest pain, no fever   Blood pressure 120/84, pulse (!) 58, temperature 97.8 F (36.6 C), temperature source Oral, resp. rate 12, height 5\' 8"  (1.727 m), weight 58.5 kg (129 lb).Body mass index is 19.61 kg/m.  General Appearance: Casual  Eye Contact:  Good  Speech:  Normal Rate  Volume:  Normal  Mood:  Depressed  Affect:  Flat  Thought Process:  Goal Directed and Descriptions of Associations: Intact  Orientation:  Full (Time, Place, and Person)  Thought Content:  WDL  Suicidal Thoughts:  No    Homicidal Thoughts:  No  Memory:  Immediate;   Good Recent;   Good Remote;   Good  Judgement:  Fair  Insight:  Fair   Psychomotor Activity:  Normal  Concentration:  Concentration: Good and Attention Span: Good  Recall:  Good  Fund of Knowledge:  Good  Language:  Good  Akathisia:  No  Handed:  Right  AIMS (if indicated):     Assets:  Communication Skills Desire for Improvement Financial Resources/Insurance Housing Physical Health Social Support Transportation  ADL's:  Intact  Cognition:  WNL  Sleep:  Number of Hours: 6   Problems Addressed: MDD severe  Treatment Plan Summary: Daily contact with patient to assess and evaluate symptoms and progress in treatment, Medication management and Plan is to:  -Continue Depakote ER 750 mg PO QHS for mood stability -Continue  Celexa to 20  mg PO Daily for mood stability -Continue Doxycycline 100 mg PO Q12H for pneumonia -Continue Trazodone 50 mg PO QHS PRN for insomnia -Encourage group therapy participation  Maryfrances Bunnellravis B Money, FNP 05/25/2017, 1:04 PM  Agree with NP Progress Note

## 2017-05-25 NOTE — [facility] Group Notes (Signed)
[facility] Group Notes:  (Nursing)  Date:  05/25/2017  Time:  1:00 PM Type of Therapy:  Nurse Education  Participation Level:  Active  Participation Quality:  Appropriate and Attentive  Affect:  Appropriate  Cognitive:  Alert and Appropriate  Insight:  Appropriate  Engagement in Group:  Engaged  Modes of Intervention:  Discussion and Education  Summary of Progress/Problems: Patient engaged in group participating in discussion about anger management Shela NevinValerie S Shyteria Lewis 05/25/2017, 5:55 PM

## 2017-05-26 LAB — VALPROIC ACID LEVEL: VALPROIC ACID LVL: 61 ug/mL (ref 50.0–100.0)

## 2017-05-26 NOTE — [facility] Group Notes (Signed)
[facility]  Date/Time:  05/26/2017  11:00AM-12:00PM  Type of Therapy and Topic:  Group Therapy:  Music and Mood  Participation Level:  Active   Description of Group: In this process group, members listened to a variety of genres of music and identified that different types of music evoke different responses.  Patients were encouraged to identify music that was soothing for them and music that was energizing for them.  Patients discussed how this knowledge can help with wellness and recovery in various ways including managing depression and anxiety as well as encouraging healthy sleep habits.    Therapeutic Goals: 1. Patients will explore the impact of different varieties of music on mood 2. Patients will verbalize the thoughts they have when listening to different types of music 3. Patients will identify music that is soothing to them as well as music that is energizing to them 4. Patients will discuss how to use this knowledge to assist in maintaining wellness and recovery 5. Patients will explore the use of music as a coping skill  Summary of Patient Progress:  At the beginning of group, patient expressed having depression and at the end said he had more peace of mind.  Therapeutic Modalities: Solution Focused Brief Therapy Activity   Ambrose MantleMareida Grossman-Orr, LCSW

## 2017-05-26 NOTE — ED Notes (Signed)
Patient observed attending relaxation group led by MHT this afternoon.

## 2017-05-26 NOTE — Progress Notes (Addendum)
[facility]  05/26/2017 11:56 AM Jonathan [name]  MRN:  [id number]   Subjective:  Patient states that he still has some depression, mainly due to talking to his mother last night and [facility]. He reports sleeping good and having a good appetite. He denies any SI/HI/AVH and contracts for safety. He is still going back to his girlfriends house after discharge.  Objective: Patient's chart and findings reviewed and discussed with treatment team. He is pleasant and cooperative. He has been engaging in conversation once he comes out of his room. He likes to sleep in and has missed morning groups, but is attending evening groups. He will continue current medications and will hopefully discharge tomorrow morning.    Principal Problem: Major depressive disorder, recurrent severe without psychotic features (HCC) Diagnosis:   Patient Active Problem List   Diagnosis Date Noted  . Pneumonia [J18.9] 05/23/2017  . Major depressive disorder, recurrent severe without psychotic features (HCC) [F33.2] 05/22/2017   Total Time spent with patient: 15 minutes  Past Psychiatric History: See H&P  Past Medical History:  Past Medical History:  Diagnosis Date  . Bipolar 1 disorder (HCC)    History reviewed. No pertinent surgical history. Family History: History reviewed. No pertinent family history. Family Psychiatric  History: See H&P Social History:  Social History   Substance and Sexual Activity  Alcohol Use Yes   Comment: occasional     Social History   Substance and Sexual Activity  Drug Use Yes  . Types: Marijuana    Social History   Socioeconomic History  . Marital status: Single    Spouse name: None  . Number of children: None  . Years of education: None  . Highest education level: None  Social Needs  . Financial resource strain: None  . Food insecurity - worry: None  . Food insecurity - inability: None  . Transportation needs -  medical: None  . Transportation needs - non-medical: None  Occupational History  . None  Tobacco Use  . Smoking status: Current Every Day Smoker    Packs/day: 0.50    Types: Cigarettes  . Smokeless tobacco: Never Used  Substance and Sexual Activity  . Alcohol use: Yes    Comment: occasional  . Drug use: Yes    Types: Marijuana  . Sexual activity: Yes    Birth control/protection: Condom  Other Topics Concern  . None  Social History Narrative   ** Merged History Encounter **       Additional Social History:   Sleep: Good  Appetite:  Good  Current Medications: Current Facility-Administered Medications  Medication Dose Route Frequency Provider Last Rate Last Dose  . acetaminophen (TYLENOL) tablet 650 mg  650 mg Oral Q6H PRN Charm RingsLord, Jamison Y, NP      . alum & mag hydroxide-simeth (MAALOX/MYLANTA) 200-200-20 MG/5ML suspension 30 mL  30 mL Oral Q4H PRN Charm RingsLord, Jamison Y, NP      . citalopram (CELEXA) tablet 20 mg  20 mg Oral Daily Grisell Bissette, Rockey SituFernando A, MD   20 mg at 05/26/17 0848  . divalproex (DEPAKOTE ER) 24 hr tablet 750 mg  750 mg Oral QHS Charm RingsLord, Jamison Y, NP   750 mg at 05/25/17 2123  . doxycycline (VIBRA-TABS) tablet 100 mg  100 mg Oral Q12H Charm RingsLord, Jamison Y, NP   100 mg at 05/26/17 0849  . ibuprofen (ADVIL,MOTRIN) tablet 600 mg  600 mg Oral Q8H PRN Izediuno, Delight OvensVincent A,  MD   600 mg at 05/23/17 1213  . [START ON 05/28/2017] Influenza vac split quadrivalent PF (FLUZONE HIGH-DOSE) injection 0.5 mL  0.5 mL Intramuscular Tomorrow-1000 Nwoko, Agnes I, NP      . magnesium hydroxide (MILK OF MAGNESIA) suspension 30 mL  30 mL Oral Daily PRN Charm Rings, NP      . ondansetron [facility]) tablet 4 mg  4 mg Oral Q8H PRN Charm Rings, NP      . Melene Muller ON 05/28/2017] pneumococcal 23 valent vaccine (PNU-IMMUNE) injection 0.5 mL  0.5 mL Intramuscular Tomorrow-1000 Nwoko, Agnes I, NP      . traZODone (DESYREL) tablet 50 mg  50 mg Oral QHS PRN Charm Rings, NP   50 mg at 05/25/17 2123    Lab  Results:  Results for orders placed or performed during the hospital encounter of 05/22/17 (from the past 48 hour(s))  Valproic acid level     Status: None   Collection Time: 05/26/17  6:42 AM  Result Value Ref Range   Valproic Acid Lvl 61 50.0 - 100.0 ug/mL    Comment: Performed at [facility], [address] [place], [place] [address]    Blood Alcohol level:  Lab Results  Component Value Date   ETH <10 05/20/2017   ETH <5 09/14/2015    Metabolic Disorder Labs: No results found for: HGBA1C, MPG No results found for: PROLACTIN No results found for: CHOL, TRIG, HDL, CHOLHDL, VLDL, LDLCALC  Physical Findings: AIMS: Facial and Oral Movements Muscles of Facial Expression: None, normal Lips and Perioral Area: None, normal Jaw: None, normal Tongue: None, normal,Extremity Movements Upper (arms, wrists, hands, fingers): None, normal Lower (legs, knees, ankles, toes): None, normal, Trunk Movements Neck, shoulders, hips: None, normal, Overall Severity Severity of abnormal movements (highest score from questions above): None, normal Incapacitation due to abnormal movements: None, normal Patient's awareness of abnormal movements (rate only patient's report): No Awareness, Dental Status Current problems with teeth and/or dentures?: No Does patient usually wear dentures?: No  CIWA:    COWS:     Musculoskeletal: Strength & Muscle Tone: within normal limits Gait & Station: normal Patient leans: N/A  Psychiatric Specialty Exam: Physical Exam  Nursing note and vitals reviewed. Constitutional: He is oriented to person, place, and time. He appears well-developed and well-nourished.  Respiratory: Effort normal.  Musculoskeletal: Normal range of motion.  Neurological: He is alert and oriented to person, place, and time.  Skin: Skin is warm.    Review of Systems  Constitutional: Negative.   HENT: Negative.   Eyes: Negative.   Respiratory: Negative.    Cardiovascular: Negative.   Gastrointestinal: Negative.   Genitourinary: Negative.   Musculoskeletal: Negative.   Skin: Negative.   Neurological: Negative.   Endo/Heme/Allergies: Negative.   Psychiatric/Behavioral: Positive for depression. Negative for hallucinations. The patient is not nervous/anxious.   denies cough or shortness of breath, no pleuritic or chest pain, no fever   Blood pressure 124/66, pulse (!) 58, temperature 98.2 F (36.8 C), resp. rate 16, height 5\' 8"  (1.727 m), weight 58.5 kg (129 lb).Body mass index is 19.61 kg/m.  General Appearance: Casual  Eye Contact:  Good  Speech:  Normal Rate  Volume:  Normal  Mood:  Euthymic  Affect:  Flat  Thought Process:  Goal Directed and Descriptions of Associations: Intact  Orientation:  Full (Time, Place, and Person)  Thought Content:  WDL  Suicidal Thoughts:  No    Homicidal Thoughts:  No  Memory:  Immediate;   Good Recent;   Good Remote;   Good  Judgement:  Fair  Insight:  Fair   Psychomotor Activity:  Normal  Concentration:  Concentration: Good and Attention Span: Good  Recall:  Good  Fund of Knowledge:  Good  Language:  Good  Akathisia:  No  Handed:  Right  AIMS (if indicated):     Assets:  Communication Skills Desire for Improvement Financial Resources/Insurance Housing Physical Health Social Support Transportation  ADL's:  Intact  Cognition:  WNL  Sleep:  Number of Hours: 6   Problems Addressed: MDD severe  Treatment Plan Summary: Daily contact with patient to assess and evaluate symptoms and progress in treatment, Medication management and Plan is to:  -Continue Depakote ER 750 mg PO QHS for mood stability -Continue  Celexa to 20  mg PO Daily for mood stability -Continue Doxycycline 100 mg PO Q12H for pneumonia -Continue Trazodone 50 mg PO QHS PRN for insomnia -Encourage group therapy participation -Hopeful discharge toorrow  Maryfrances Bunnell, FNP 05/26/2017, 11:56 AM    Agree with NP Progress  Note

## 2017-05-26 NOTE — Progress Notes (Signed)
D: Pt denies SI/HI/AVH, pt stated his depression was a little less  A: Pt was offered support and encouragement. Pt was given scheduled medications. Pt was encourage to attend groups. Q 15 minute checks were done for safety.   R:Pt attends groups and interacts well with peers and staff. Pt is taking medication. Pt has no complaints.Pt receptive to treatment and safety maintained on unit.

## 2017-05-26 NOTE — Progress Notes (Signed)
Patient did attend the evening speaker AA meeting.  

## 2017-05-26 NOTE — Progress Notes (Signed)
D. Pt presents with a sad affect and depressed behavior. Pt is calm and cooperative, brightens a little during interactions but is somewhat guarded. Per pt's self inventory, pt rates his depression, hopelessness and anxiety a 5/1/0, respectively. Pt write that his goal today is "to find ways to work on my depression." Pt currently denies pain,SI/HI and AV hallucinations. A. Labs and vitals monitored. Pt compliant with medications. Pt supported emotionally and encouraged to express concerns and ask questions.   R. Pt remains safe with 15 minute checks. Will continue POC.

## 2017-05-27 MED ORDER — DOXYCYCLINE HYCLATE 100 MG PO TABS: 100.0000 mg | ORAL_TABLET | Freq: Two times a day (BID) | ORAL | 0 refills | Status: DC

## 2017-05-27 MED ORDER — TRAZODONE HCL 50 MG PO TABS: 50.0000 mg | ORAL_TABLET | Freq: Every evening | ORAL | 0 refills | Status: DC | PRN

## 2017-05-27 MED ORDER — DIVALPROEX SODIUM ER 250 MG PO TB24: 750.0000 mg | ORAL_TABLET | Freq: Every day | ORAL | 0 refills | Status: DC

## 2017-05-27 MED ORDER — CITALOPRAM HYDROBROMIDE 20 MG PO TABS: 20.0000 mg | ORAL_TABLET | Freq: Every day | ORAL | 0 refills | Status: DC

## 2017-05-27 NOTE — [facility] Group Notes (Signed)
Pt attended spiritual care group on grief and loss facilitated by chaplain Burnis KingfisherMatthew Arfa Lamarca   Group opened with brief discussion and psycho-social ed around grief and loss in relationships and in relation to self - identifying life patterns, circumstances, changes that cause losses. Established group norm of speaking from own life experience. Group goal of establishing open and affirming space for members to share loss and experience with grief, normalize grief experience and provide psycho social education and grief support.    Jonathan [name] was present throughout group.  He engaged with facilitator and other group members voluntarily.  He received support from group around the death of his mother this morning as well as death of grandmother in past month.  Group provided empathic presence, normalization of grief response - especially feelings of bewilderment, shock, numbness.  Jonathan [name] stated that he was not able to see his mother for 13 years of his life -- his father took him away at 22 years old and he returned at [age].  Had been building relationship with his mother and named feeling unfinished.  He also spoke with the group about boundaries in how he related to his mother - stating she continued to smoke even with trach, use substances, not use oxygen - so he had to recognize that there are things he cannot change.  Group provided support and conversation around family system affecting grief response.   Jonathan [name] described ongoing tension in family system around girlfriend and sisters.   Is motivated by his son and relationship with girlfriend.  In speaking with group about what will get him through today's meeting with family, he described wanting to be there for his son and leaning on girlfriend.    [facility] /  [facility] Chaplain Burnis KingfisherMatthew Zayed Griffie, MDiv

## 2017-05-27 NOTE — Progress Notes (Signed)
Data. Patient denies SI/HI/AVH. Verbally contracts for safety on the unit and to come to staff before acting of any self harm thoughts/feelings. Patient received word from his girl friend this morning that his mother, who had been ill with complications of COPD and flu, had passed this AM. Patient expressed feelings of, "Numbness and sadness." Affect is flat and does not brighten. Patient expressed continued wish to leave at this time. "I really need to go and be with my family at this time." On his self assessment patient reports 0/10 for depression, anxiety and hopelessness. His goal for today is, Depression." Action. Emotional support and encouragement offered. Education provided on medication, indications and side effect. Q 15 minute checks done for safety. Response. Safety on the unit maintained through 15 minute checks.  Medications taken as prescribed. Attended groups. Remained calm and appropriate.  Pt. discharged to lobby.  Belongings sheet reviewed and signed by pt. and all belongings, including sample medications, medication scripts and a bus pass,  sent home. Paperwork reviewed and pt. able to verbalize understanding of education. Pt. in no current distress and ambulatory.

## 2017-05-27 NOTE — Progress Notes (Signed)
  [facility] :  Will you be returning to the same living situation after discharge:  Yes,  with his girlfriend At discharge, do you have transportation home?: Yes,  bus pass Do you have the ability to pay for your medications: No.  Release of information consent forms completed and in the chart;  Patient's signature needed at discharge.  Patient to Follow up at: Follow-up Information    [facility] up.   Specialty:  Behavioral Health Why:  Appointment is 05/30/17 at 8:00am. Please be on time and bring any discharge paperwork Contact information: [address] [place] [address] [phone]        [facility].   Why:  Walk-In appointments is Wednesday, 05/29/17 at 11:00am. Please bring your ID and any Discharge paperwork from the hospital.  Contact information: [address], [place], [place] [address]. Phone:[phone] Fax:          Next level of care provider [facility] Link:no  Safety Planning and Suicide Prevention discussed: Yes,  with the patient's girlfriend, Jonathan [name]   Have you used any form of tobacco in the last 30 days? (Cigarettes, Smokeless Tobacco, Cigars, and/or Pipes): Yes  Has patient been referred to the Quitline?: Patient refused referral  Patient has been referred for addiction treatment: Yes  Maeola SarahJolan E Issa Luster, LCSWA 05/27/2017, 9:48 AM

## 2017-05-27 NOTE — Progress Notes (Signed)
Psychoeducational Group Note  Date:  05/27/2017 Time: 0910  Group Topic/Focus:  Wellness Toolbox:   The focus of this group is to discuss various aspects of wellness, balancing those aspects and exploring ways to increase the ability to experience wellness.  Patients will create a wellness toolbox for use upon discharge.  Participation Level: Did Not Attend  Participation Quality:  Not Applicable  Affect:  Not Applicable  Cognitive:  Not Applicable  Insight:  Not Applicable  Engagement in Group: Not Applicable  Additional Comments:  Pt received some bad news from a family member and was unable to attend group this morning.  Jonathan [name] E 05/27/2017, 10:21 AM

## 2017-05-27 NOTE — Discharge Summary (Addendum)
Physician Discharge Summary Note  Patient:  Jonathan [name] is an 22 y.o., male MRN:  [id number] DOB:  01/21/1996 Patient phone:  [phone] (home)  Patient address:   [address] [place] [address],  Total Time spent with patient: 30 minutes  Date of Admission:  05/22/2017 Date of Discharge: 05/27/2017  Reason for Admission: Per assessment note on the HPI-  Jonathan Gilmoreis a 21 y.o.malewho arrived to the [facility] via EMS due to illness. Upon arrival, pt expressed thoughts of suicide that he has been having since his grandmother's death on 04-23-2017. Pt shared he has felt suicidal in the past but that the feelings were not this strong. He reports his plan was to cut himself.  Pt shares he does not currently have a therapist nor a psychiatrist but that he has made an appointment with a therapist which is for June 06, 2017. Pt states he knows he cannot wait that long, which is why he has requested help today. Pt has no history of MH/SA services, with the exception of when he was admitted to [facility] approximately 2 years ago for self-harm. Pt estimates that he was cutting himself for a couple of months before he was placed in [facility] and that he never cut himself again. Pt states he was cutting himself for self-harm and that it was not suicidal ideation or attempts    Principal Problem: Major depressive disorder, recurrent severe without psychotic features [facility]) Discharge Diagnoses: Patient Active Problem List   Diagnosis Date Noted  . Pneumonia [J18.9] 05/23/2017  . Major depressive disorder, recurrent severe without psychotic features (HCC) [F33.2] 05/22/2017    Past Psychiatric History:   Past Medical History:  Past Medical History:  Diagnosis Date  . Bipolar 1 disorder (HCC)    History reviewed. No pertinent surgical history. Family History: History reviewed. No pertinent family history. Family Psychiatric  History:  Social History:  Social History    Substance and Sexual Activity  Alcohol Use Yes   Comment: occasional     Social History   Substance and Sexual Activity  Drug Use Yes  . Types: Marijuana    Social History   Socioeconomic History  . Marital status: Single    Spouse name: None  . Number of children: None  . Years of education: None  . Highest education level: None  Social Needs  . Financial resource strain: None  . Food insecurity - worry: None  . Food insecurity - inability: None  . Transportation needs - medical: None  . Transportation needs - non-medical: None  Occupational History  . None  Tobacco Use  . Smoking status: Current Every Day Smoker    Packs/day: 0.50    Types: Cigarettes  . Smokeless tobacco: Never Used  Substance and Sexual Activity  . Alcohol use: Yes    Comment: occasional  . Drug use: Yes    Types: Marijuana  . Sexual activity: Yes    Birth control/protection: Condom  Other Topics Concern  . None  Social History Narrative   ** Merged History Encounter **        Hospital Course: Jonathan [name] was admitted for Major depressive disorder, recurrent severe without psychotic features (HCC) and crisis management.  Pt was treated discharged with the medications listed below under Medication List.  Medical problems were identified and treated as needed.  Home medications were restarted as appropriate.  Improvement was monitored by observation and Jonathan [name] 's daily report of symptom reduction.  Emotional and mental status was monitored by daily self-inventory reports completed by Jonathan [name] and clinical staff.         Jonathan [name] was evaluated by the treatment team for stability and plans for continued recovery upon discharge. Jonathan [name] 's motivation was an integral factor for scheduling further treatment. Employment, transportation, bed availability, health status, family support, and any pending legal issues were also considered during hospital stay. Pt was  offered further treatment options upon discharge including but not limited to Residential, Intensive Outpatient, and Outpatient treatment.  Jonathan [name] will follow up with the services as listed below under Follow Up Information.   Upon completion of this admission the patient was both mentally and medically stable for discharge denying suicidal or homicidal ideation, auditor or visual hallucinations, delusional thoughts and paranoia. Spiritual care and grief counseling  was provided at discharge, due to reported passing of his mother.  Jonathan [name] responded well to treatment with Celexa 20 mg and Depakote 750 mg without adverse effects. Pt demonstrated improvement without reported or observed adverse effects to the point of stability appropriate for outpatient management. Pertinent labs include: CBC and UDS + THC for which outpatient follow-up is necessary for lab recheck as mentioned below. Reviewed CBC, CMP, BAL, and UDS; all unremarkable aside from noted exceptions.    Physical Findings: AIMS: Facial and Oral Movements Muscles of Facial Expression: None, normal Lips and Perioral Area: None, normal Jaw: None, normal Tongue: None, normal,Extremity Movements Upper (arms, wrists, hands, fingers): None, normal Lower (legs, knees, ankles, toes): None, normal, Trunk Movements Neck, shoulders, hips: None, normal, Overall Severity Severity of abnormal movements (highest score from questions above): None, normal Incapacitation due to abnormal movements: None, normal Patient's awareness of abnormal movements (rate only patient's report): No Awareness, Dental Status Current problems with teeth and/or dentures?: No Does patient usually wear dentures?: No  CIWA:    COWS:     Musculoskeletal: Strength & Muscle Tone: within normal limits Gait & Station: normal Patient leans: N/A  Psychiatric Specialty Exam: See SRA by MD  Physical Exam  Vitals reviewed. Neurological: He is alert.   Psychiatric: He has a normal mood and affect.    Review of Systems  Psychiatric/Behavioral: Negative for hallucinations and suicidal ideas. Depression: stable,  Nervous/anxious: stable.     Blood pressure 109/70, pulse (!) 54, temperature (!) 97.5 F (36.4 C), temperature source Oral, resp. rate 20, height 5\' 8"  (1.727 m), weight 58.5 kg (129 lb).Body mass index is 19.61 kg/m.   Have you used any form of tobacco in the last 30 days? (Cigarettes, Smokeless Tobacco, Cigars, and/or Pipes): Yes  Has this patient used any form of tobacco in the last 30 days? (Cigarettes, Smokeless Tobacco, Cigars, and/or Pipes) Yes, Yes, A prescription for an FDA-approved tobacco cessation medication was offered at discharge and the patient refused  Blood Alcohol level:  Lab Results  Component Value Date   ETH <10 05/20/2017   ETH <5 09/14/2015    Metabolic Disorder Labs:  No results found for: HGBA1C, MPG No results found for: PROLACTIN No results found for: CHOL, TRIG, HDL, CHOLHDL, VLDL, LDLCALC  See Psychiatric Specialty Exam and Suicide Risk Assessment completed by Attending Physician prior to discharge.  Discharge destination:  Home  Is patient on multiple antipsychotic therapies at discharge:  No   Has Patient had three or more failed trials of antipsychotic monotherapy by history:  No  Recommended Plan for Multiple Antipsychotic Therapies: NA  Discharge Instructions  Diet - low sodium heart healthy   Complete by:  As directed    Discharge instructions   Complete by:  As directed    Take all medications as prescribed. Keep all follow-up appointments as scheduled.  Do not consume alcohol or use illegal drugs while on prescription medications. Report any adverse effects from your medications to your primary care provider promptly.  In the event of recurrent symptoms or worsening symptoms, call 911, a crisis hotline, or go to the nearest emergency department for evaluation.   Increase  activity slowly   Complete by:  As directed      Allergies as of 05/27/2017   No Known Allergies     Medication List    STOP taking these medications   ibuprofen 800 MG tablet Commonly known as:  ADVIL,MOTRIN   nicotine 21 mg/24hr patch Commonly known as:  NICODERM CQ - dosed in mg/24 hours   ondansetron 4 MG tablet Commonly known as:  ZOFRAN     TAKE these medications     Indication  citalopram 20 MG tablet Commonly known as:  CELEXA Take 1 tablet (20 mg total) by mouth daily. What changed:    medication strength  how much to take  additional instructions  Indication:  Depression   divalproex 250 MG 24 hr tablet Commonly known as:  DEPAKOTE ER Take 3 tablets (750 mg total) by mouth at bedtime.  Indication:  Mixed Bipolar Affective Disorder   doxycycline 100 MG tablet Commonly known as:  VIBRA-TABS Take 1 tablet (100 mg total) by mouth every 12 (twelve) hours.  Indication:  infection   traZODone 50 MG tablet Commonly known as:  DESYREL Take 1 tablet (50 mg total) by mouth at bedtime as needed for sleep.  Indication:  Trouble Sleeping      Follow-up Information    [facility] Follow up.   Specialty:  Behavioral Health Why:  Appointment is 05/30/17 at 8:00am. Please be on time and bring any discharge paperwork Contact information: [address] [place] [address] [phone]        [facility] Follow up.   Why:  Walk-In appointments are Monday, 05/27/17 at 11:00am or Wednesday, 05/29/17 at 11:00am.  Contact information: [address] [place], [place] [address]. Phone:[phone] Fax:          Follow-up recommendations:  Activity:  as tolerated Diet:  heart healthy  Comments:  Take all medications as prescribed. Keep all follow-up appointments as scheduled.  Do not consume alcohol or use illegal drugs while on prescription medications. Report any adverse effects from your medications to your primary care provider promptly.  In  the event of recurrent symptoms or worsening symptoms, call 911, a crisis hotline, or go to the nearest emergency department for evaluation.   Signed: Oneta Rack, NP 05/27/2017, 8:40 AM  Patient seen, Suicide Assessment Completed.  Disposition Plan Reviewed

## 2017-05-27 NOTE — [facility] Suicide Risk Assessment (Signed)
[facility]   Principal Problem: Major depressive disorder, recurrent severe without psychotic features [facility](HCC) Discharge Diagnoses:  Patient Active Problem List   Diagnosis Date Noted  . Pneumonia [J18.9] 05/23/2017  . Major depressive disorder, recurrent severe without psychotic features (HCC) [F33.2] 05/22/2017    Total Time spent with patient: 30 minutes  Musculoskeletal: Strength & Muscle Tone: within normal limits Gait & Station: normal Patient leans: N/A  Psychiatric Specialty Exam: ROS denies headache, no chest pain, no shortness of breath, no vomiting   Blood pressure 109/70, pulse (!) 54, temperature (!) 97.5 F (36.4 C), temperature source Oral, resp. rate 20, height 5\' 8"  (1.727 m), weight 58.5 kg (129 lb).Body mass index is 19.61 kg/m.  General Appearance: improving grooming   Eye Contact::  Good  Speech:  Normal Rate409  Volume:  Normal  Mood:  improving mood , states " I feel better than I did ". Saddened by news of mother passing away, but states that he is feeling better than prior to admission   Affect:  mildly constricted, but reactive , smiles briefly at times   Thought Process:  Linear and Descriptions of Associations: Intact  Orientation:  Other:  fully alert and attentive   Thought Content:  denies hallucinations, no delusions, not internally preoccupied   Suicidal Thoughts:  No denies any suicidal or self injurious ideations, no homicidal or violent ideations  Homicidal Thoughts:  No  Memory:  recent and remote grossly intact   Judgement:  Other:  improving   Insight:  improving   Psychomotor Activity:  Normal  Concentration:  Good  Recall:  Good  Fund of Knowledge:Good  Language: Good  Akathisia:  Negative  Handed:  Right  AIMS (if indicated):     Assets:  Communication Skills Desire for Improvement Resilience  Sleep:  Number of Hours: 5.5  Cognition: WNL  ADL's:  Intact   Mental Status Per Nursing Assessment::   On  Admission:     Demographic Factors:  22 year old single male, has an infant son, who is currently with the mother,   Loss Factors: Had recently been kicked out by his GF, grandmother passed away recently.   Historical Factors: No prior psychiatric admissions, denies history of prior suicidal attempts , describes history of intermittent explosiveness  Risk Reduction Factors:   Responsible for children under 23 years of age, Sense of responsibility to family and Positive coping skills or problem solving skills  Continued Clinical Symptoms:  At this time patient is alert, attentive, calm, reports he is feeling better than prior to admission, and mood presents improved, affect is appropriate , reactive, reports he is saddened by finding out his mother has passed away, but states that he is feeling better overall and ready for discharge today. Denies suicidal or self injurious ideations, denies homicidal or violent ideations, no hallucinations, no delusions, future oriented. Denies medication side effects. Valproic Acid Serum Level therapeutic at 61  Behavior on unit calm and in good control. No explosive or angry outbursts on unit.  Patient reports he has had good conversations with his GF and he can now return to live with her after discharge. He states he recently found out his mother has passed away- he knew she was seriously medically ill, and plans to spend time with family for support .   Cognitive Features That Contribute To Risk:  No gross cognitive deficits noted upon discharge. Is alert , attentive, and oriented x 3    Suicide  Risk:  Mild:  Suicidal ideation of limited frequency, intensity, duration, and specificity.  There are no identifiable plans, no associated intent, mild dysphoria and related symptoms, good self-control (both objective and subjective assessment), few other risk factors, and identifiable protective factors, including available and accessible social  support.  Follow-up Information    [facility] Follow up.   Specialty:  Behavioral Health Why:  Appointment is 05/30/17 at 8:00am. Please be on time and bring any discharge paperwork Contact information: [address] [place] [address] [phone]        [facility] Follow up.   Why:  Walk-In appointments is Wednesday, 05/29/17 at 11:00am. Please bring your ID and any Discharge paperwork from the hospital.  Contact information: [address], [place], [place] [address]. Phone:[phone] Fax:          Plan Of Care/Follow-up recommendations:  Activity:  as tolerated  Diet:  regular Tests:  NA Other:  See below  Patient is reporting improvement and expressing readiness for discharge- at this time there are no grounds for involuntary commitment. He plans to return to live with his GF/child's mother . Plans to follow up as above .    Craige Cotta, MD 05/27/2017, 10:50 AM

## 2017-07-30 ENCOUNTER — Encounter (HOSPITAL_COMMUNITY): Payer: Self-pay

## 2017-07-30 ENCOUNTER — Emergency Department (HOSPITAL_COMMUNITY)
Admission: EM | Admit: 2017-07-30 | Discharge: 2017-07-30 | Disposition: A | Discharge status: Home / Self Care | Attending: Emergency Medicine | Admitting: Emergency Medicine

## 2017-07-30 ENCOUNTER — Emergency Department (HOSPITAL_COMMUNITY)

## 2017-07-30 DIAGNOSIS — Y999 Unspecified external cause status: Secondary | ICD-10-CM | POA: Insufficient documentation

## 2017-07-30 DIAGNOSIS — Z79899 Other long term (current) drug therapy: Secondary | ICD-10-CM | POA: Insufficient documentation

## 2017-07-30 DIAGNOSIS — Y9389 Activity, other specified: Secondary | ICD-10-CM | POA: Insufficient documentation

## 2017-07-30 DIAGNOSIS — F17210 Nicotine dependence, cigarettes, uncomplicated: Secondary | ICD-10-CM | POA: Insufficient documentation

## 2017-07-30 DIAGNOSIS — Y929 Unspecified place or not applicable: Secondary | ICD-10-CM | POA: Insufficient documentation

## 2017-07-30 DIAGNOSIS — S60221A Contusion of right hand, initial encounter: Secondary | ICD-10-CM | POA: Insufficient documentation
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
## 2017-07-30 NOTE — Discharge Instructions (Signed)
Please read instructions below. Apply ice to your hand for 20 minutes at a time. You can take advil/ibuprofen every 6 hours as needed for pain. Return to the ER for new or concerning symptoms.

## 2017-09-10 ENCOUNTER — Encounter (HOSPITAL_COMMUNITY): Payer: Self-pay | Admitting: Emergency Medicine

## 2017-09-10 ENCOUNTER — Other Ambulatory Visit: Payer: Self-pay

## 2017-09-10 ENCOUNTER — Emergency Department (HOSPITAL_COMMUNITY)
Admission: EM | Admit: 2017-09-10 | Discharge: 2017-09-10 | Disposition: A | Discharge status: Home / Self Care | Payer: Self-pay | Attending: Emergency Medicine | Admitting: Emergency Medicine

## 2017-09-10 DIAGNOSIS — R05 Cough: Secondary | ICD-10-CM | POA: Insufficient documentation

## 2017-09-10 DIAGNOSIS — Z5321 Procedure and treatment not carried out due to patient leaving prior to being seen by health care provider: Secondary | ICD-10-CM | POA: Insufficient documentation

## 2017-09-10 NOTE — ED Notes (Signed)
Pt called to room. No answer. Will check radiology.

## 2017-09-10 NOTE — ED Triage Notes (Signed)
Patient complains of cough and headache for the last several weeks, states he wants to be checked for pneumonia. Afebrile in triage.

## 2017-09-10 NOTE — ED Notes (Signed)
[facility] called. Pt is not over there. Pt called to room from waiting room no answer. Pt LWBS after triage.

## 2018-03-28 ENCOUNTER — Emergency Department (HOSPITAL_COMMUNITY): Payer: Self-pay

## 2018-03-28 ENCOUNTER — Other Ambulatory Visit: Payer: Self-pay

## 2018-03-28 ENCOUNTER — Encounter (HOSPITAL_COMMUNITY): Payer: Self-pay | Admitting: Emergency Medicine

## 2018-03-28 ENCOUNTER — Emergency Department (HOSPITAL_COMMUNITY)
Admission: EM | Admit: 2018-03-28 | Discharge: 2018-03-28 | Discharge status: Against Medical Advice | Payer: Self-pay | Attending: Emergency Medicine | Admitting: Emergency Medicine

## 2018-03-28 DIAGNOSIS — Z5321 Procedure and treatment not carried out due to patient leaving prior to being seen by health care provider: Secondary | ICD-10-CM | POA: Insufficient documentation

## 2018-03-28 NOTE — ED Notes (Signed)
Called for Pt to Move to RM. No Answer
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
## 2018-03-28 NOTE — ED Notes (Signed)
Pt called for room x2 with no answer 

## 2019-03-07 ENCOUNTER — Other Ambulatory Visit: Payer: Self-pay

## 2019-03-07 ENCOUNTER — Encounter (HOSPITAL_COMMUNITY): Payer: Self-pay | Admitting: Emergency Medicine

## 2019-03-07 ENCOUNTER — Emergency Department (HOSPITAL_COMMUNITY)
Admission: EM | Admit: 2019-03-07 | Discharge: 2019-03-07 | Disposition: A | Discharge status: Home / Self Care | Payer: Medicaid Other | Attending: Emergency Medicine | Admitting: Emergency Medicine

## 2019-03-07 DIAGNOSIS — Z5321 Procedure and treatment not carried out due to patient leaving prior to being seen by health care provider: Secondary | ICD-10-CM | POA: Insufficient documentation

## 2019-03-07 DIAGNOSIS — H5710 Ocular pain, unspecified eye: Secondary | ICD-10-CM | POA: Insufficient documentation
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
## 2019-03-07 NOTE — ED Notes (Signed)
Pt asked for a bandaid while this RN was helping another pt.  Asked him to wait until I was finished and I would get him one.  Unable to locate pt at this time.

## 2019-03-07 NOTE — ED Notes (Signed)
No answer in waiting room 

## 2019-07-31 ENCOUNTER — Encounter (HOSPITAL_COMMUNITY): Payer: Self-pay

## 2019-07-31 ENCOUNTER — Emergency Department (HOSPITAL_COMMUNITY)
Admission: EM | Admit: 2019-07-31 | Discharge: 2019-07-31 | Disposition: A | Discharge status: Home / Self Care | Payer: Medicaid Other | Attending: Emergency Medicine | Admitting: Emergency Medicine

## 2019-07-31 ENCOUNTER — Emergency Department (HOSPITAL_COMMUNITY): Payer: Medicaid Other

## 2019-07-31 ENCOUNTER — Other Ambulatory Visit: Payer: Self-pay

## 2019-07-31 DIAGNOSIS — Z8701 Personal history of pneumonia (recurrent): Secondary | ICD-10-CM | POA: Insufficient documentation

## 2019-07-31 DIAGNOSIS — R0789 Other chest pain: Secondary | ICD-10-CM | POA: Insufficient documentation

## 2019-07-31 DIAGNOSIS — R0602 Shortness of breath: Secondary | ICD-10-CM | POA: Insufficient documentation

## 2019-07-31 LAB — CBC WITH DIFFERENTIAL/PLATELET
Abs Immature Granulocytes: 0.02 10*3/uL (ref 0.00–0.07)
Basophils Absolute: 0.1 10*3/uL (ref 0.0–0.1)
Basophils Relative: 1 %
Eosinophils Absolute: 0.1 10*3/uL (ref 0.0–0.5)
Eosinophils Relative: 1 %
HCT: 43.7 % (ref 39.0–52.0)
Hemoglobin: 15.2 g/dL (ref 13.0–17.0)
Immature Granulocytes: 0 %
Lymphocytes Relative: 8 %
Lymphs Abs: 0.6 10*3/uL — ABNORMAL LOW (ref 0.7–4.0)
MCH: 31.9 pg (ref 26.0–34.0)
MCHC: 34.8 g/dL (ref 30.0–36.0)
MCV: 91.6 fL (ref 80.0–100.0)
Monocytes Absolute: 1.3 10*3/uL — ABNORMAL HIGH (ref 0.1–1.0)
Monocytes Relative: 17 %
Neutro Abs: 5.8 10*3/uL (ref 1.7–7.7)
Neutrophils Relative %: 73 %
Platelets: 223 10*3/uL (ref 150–400)
RBC: 4.77 MIL/uL (ref 4.22–5.81)
RDW: 12.8 % (ref 11.5–15.5)
WBC: 7.9 10*3/uL (ref 4.0–10.5)
nRBC: 0 % (ref 0.0–0.2)

## 2019-07-31 LAB — COMPREHENSIVE METABOLIC PANEL
ALT: 19 U/L (ref 0–44)
AST: 25 U/L (ref 15–41)
Albumin: 4.7 g/dL (ref 3.5–5.0)
Alkaline Phosphatase: 36 U/L — ABNORMAL LOW (ref 38–126)
Anion gap: 8 (ref 5–15)
BUN: 12 mg/dL (ref 6–20)
CO2: 25 mmol/L (ref 22–32)
Calcium: 9.4 mg/dL (ref 8.9–10.3)
Chloride: 103 mmol/L (ref 98–111)
Creatinine, Ser: 1.26 mg/dL — ABNORMAL HIGH (ref 0.61–1.24)
GFR calc Af Amer: >60 mL/min (ref >60)
GFR calc non Af Amer: >60 mL/min (ref >60)
Glucose, Bld: 86 mg/dL (ref 70–99)
Potassium: 3.6 mmol/L (ref 3.5–5.1)
Sodium: 136 mmol/L (ref 135–145)
Total Bilirubin: 0.7 mg/dL (ref 0.3–1.2)
Total Protein: 8.1 g/dL (ref 6.5–8.1)

## 2019-07-31 MED ORDER — ALBUTEROL SULFATE HFA 108 (90 BASE) MCG/ACT IN AERS
8.0000 | INHALATION_SPRAY | Freq: Once | RESPIRATORY_TRACT | Status: AC
  Administered 2019-07-31: 04:00:00 8 via RESPIRATORY_TRACT
  Filled 2019-07-31: qty 6.7

## 2019-07-31 NOTE — ED Provider Notes (Signed)
[facility]-EMERGENCY DEPT Provider Note   CSN: [id number] Arrival date & time: 07/31/19  0202     History Chief Complaint  Patient presents with  . Shortness of Breath    Jonathan [name] is a 24 y.o. male.  The history is provided by the patient and medical records.  Shortness of Breath   24 year old male with history of bipolar disorder, depression, presenting to the ED with shortness of breath. States this began around 2 AM when he woke from sleep. He reports generalized chest tightness and feeling like he is not getting all of his air. Denies any cough, fever, or other infectious symptoms. No history of asthma or other baseline respiratory issues. He has not had any sick contacts or known Covid exposures. No cardiac history. No intervention tried prior to arrival.  Past Medical History:  Diagnosis Date  . Bipolar 1 disorder [facility])     Patient Active Problem List   Diagnosis Date Noted  . Pneumonia 05/23/2017  . 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 No pertinent surgical history.     No family history on file.  Social History   Tobacco Use  . Smoking status: Former Smoker    Packs/day: 1.50    Types: Cigarettes  . Smokeless tobacco: Never Used  Substance Use Topics  . Alcohol use: Not Currently    Comment: occasional  . Drug use: Yes    Types: Marijuana    Home Medications Prior to Admission medications   Medication Sig Start Date End Date Taking? Authorizing Provider  citalopram (CELEXA) 20 MG tablet Take 1 tablet (20 mg total) by mouth daily. Patient not taking: Reported on 07/31/2019 05/27/17   Oneta Rack, NP  divalproex (DEPAKOTE ER) 250 MG 24 hr tablet Take 3 tablets (750 mg total) by mouth at bedtime. Patient not taking: Reported on 07/30/2017 05/27/17   Oneta Rack, NP  doxycycline (VIBRA-TABS) 100 MG tablet Take 1 tablet (100 mg total) by mouth every 12  (twelve) hours. Patient not taking: Reported on 07/30/2017 05/27/17   Oneta Rack, NP  traZODone (DESYREL) 50 MG tablet Take 1 tablet (50 mg total) by mouth at bedtime as needed for sleep. Patient not taking: Reported on 07/31/2019 05/27/17   Oneta Rack, NP    Allergies    Patient has no known allergies.  Review of Systems   Review of Systems  Respiratory: Positive for shortness of breath.   All other systems reviewed and are negative.   Physical Exam Updated Vital Signs BP 122/76 (BP Location: Left Arm)   Pulse 76   Temp 99.4 F (37.4 C) (Oral)   Resp 16   Ht 5\' 8"  (1.727 m)   Wt 65.8 kg   SpO2 98% Comment: Simultaneous filing. User may not have seen previous data.  BMI 22.05 kg/m   Physical Exam Vitals and nursing note reviewed.  Constitutional:      Appearance: He is well-developed.     Comments: Sleeping in chair, had to be awoken for exam  HENT:     Head: Normocephalic and atraumatic.  Eyes:     Conjunctiva/sclera: Conjunctivae normal.     Pupils: Pupils are equal, round, and reactive to light.  Cardiovascular:     Rate and Rhythm: Normal rate and regular rhythm.     Heart sounds: Normal heart sounds.  Pulmonary:     Effort: Pulmonary effort is normal.  Breath sounds: Wheezing present. No rhonchi.     Comments: No acute distress, faint end expiratory wheeze, able to speak in full sentences without difficulty Abdominal:     General: Bowel sounds are normal.     Palpations: Abdomen is soft.  Musculoskeletal:        General: Normal range of motion.     Cervical back: Normal range of motion.  Skin:    General: Skin is warm and dry.  Neurological:     Mental Status: He is alert and oriented to person, place, and time.     ED Results / Procedures / Treatments   Labs (all labs ordered are listed, but only abnormal results are displayed) Labs Reviewed  CBC WITH DIFFERENTIAL/PLATELET - Abnormal; Notable for the following components:      Result Value    Lymphs Abs 0.6 (*)    Monocytes Absolute 1.3 (*)    All other components within normal limits  COMPREHENSIVE METABOLIC PANEL - Abnormal; Notable for the following components:   Creatinine, Ser 1.26 (*)    Alkaline Phosphatase 36 (*)    All other components within normal limits    EKG None  Radiology DG Chest 2 View  Result Date: 07/31/2019 CLINICAL DATA:  Shortness of breath. EXAM: CHEST - 2 VIEW COMPARISON:  May 20, 2017 FINDINGS: The heart size and mediastinal contours are within normal limits. Both lungs are clear. The visualized skeletal structures are unremarkable. IMPRESSION: No active cardiopulmonary disease. Electronically Signed   By: Virgina Norfolk M.D.   On: 07/31/2019 02:27    Procedures Procedures (including critical care time)  Medications Ordered in ED Medications  albuterol (VENTOLIN HFA) 108 (90 Base) MCG/ACT inhaler 8 puff (8 puffs Inhalation Given 07/31/19 0403)    ED Course  I have reviewed the triage vital signs and the nursing notes.  Pertinent labs & imaging results that were available during my care of the patient were reviewed by me and considered in my medical decision making (see chart for details).    MDM Rules/Calculators/A&P  24 year old male here with shortness of breath and chest tightness upon waking. He is afebrile and nontoxic. He is sleeping in triage chair, had to be awoken for exam. His vitals remained stable on room air. No signs of respiratory distress. He does have faint end expiratory wheezes. Labs were obtained and are overall reassuring. Chest x-ray is clear. Will obtain EKG, although I have low suspicion for ACS or other cardiac etiology. Questioned about Covid several times, patient denies any known exposure and does not have any expressed concern for this. He declined formal Covid testing here today.  EKG without acute ischemic changes.  Feel he is stable for discharge home.  He was given albuterol inhaler here to take home.   Can continue using PRN.   Follow-up with PCP.  Return here for any new/acute changes.  Final Clinical Impression(s) / ED Diagnoses Final diagnoses:  Chest tightness  Shortness of breath    Rx / DC Orders ED Discharge Orders    None       Larene Pickett, PA-C 07/31/19 0506    Ezequiel Essex, MD 07/31/19 [phone]

## 2019-07-31 NOTE — Discharge Instructions (Signed)
Can continue using albuterol inhaler as needed. Recommend that you follow-up with your primary care doctor. Return here for any new/acute changes.

## 2019-07-31 NOTE — ED Notes (Signed)
Pt refused coronavirus test.

## 2019-07-31 NOTE — ED Triage Notes (Signed)
Shob since this morning. Woke up with generalized pains on inspiration.

## 2021-07-01 ENCOUNTER — Encounter (HOSPITAL_BASED_OUTPATIENT_CLINIC_OR_DEPARTMENT_OTHER): Payer: Self-pay | Admitting: *Deleted

## 2021-07-01 ENCOUNTER — Other Ambulatory Visit: Payer: Self-pay

## 2021-07-01 ENCOUNTER — Emergency Department (HOSPITAL_BASED_OUTPATIENT_CLINIC_OR_DEPARTMENT_OTHER)
Admission: EM | Admit: 2021-07-01 | Discharge: 2021-07-01 | Disposition: A | Discharge status: Home / Self Care | Payer: Medicaid Other | Attending: Emergency Medicine | Admitting: Emergency Medicine

## 2021-07-01 DIAGNOSIS — Z202 Contact with and (suspected) exposure to infections with a predominantly sexual mode of transmission: Secondary | ICD-10-CM | POA: Insufficient documentation

## 2021-07-01 DIAGNOSIS — D72829 Elevated white blood cell count, unspecified: Secondary | ICD-10-CM | POA: Insufficient documentation

## 2021-07-01 LAB — URINALYSIS, ROUTINE W REFLEX MICROSCOPIC
Bilirubin Urine: NEGATIVE
Glucose, UA: NEGATIVE mg/dL
Hgb urine dipstick: NEGATIVE
Ketones, ur: NEGATIVE mg/dL
Nitrite: NEGATIVE
Protein, ur: 30 mg/dL — AB
Specific Gravity, Urine: 1.034 — ABNORMAL HIGH (ref 1.005–1.030)
WBC, UA: >50 WBC/hpf — ABNORMAL HIGH (ref 0–5)
pH: 6.5 (ref 5.0–8.0)

## 2021-07-01 MED ORDER — LIDOCAINE HCL (PF) 1 % IJ SOLN
1.0000 mL | Freq: Once | INTRAMUSCULAR | Status: AC
  Administered 2021-07-01: 1 mL
  Filled 2021-07-01: qty 5

## 2021-07-01 MED ORDER — DOXYCYCLINE HYCLATE 100 MG PO CAPS: 100.0000 mg | ORAL_CAPSULE | Freq: Two times a day (BID) | ORAL | 0 refills | Status: DC

## 2021-07-01 MED ORDER — AZITHROMYCIN 250 MG PO TABS
1000.0000 mg | ORAL_TABLET | Freq: Once | ORAL | Status: AC
  Administered 2021-07-01: 1000 mg via ORAL
  Filled 2021-07-01: qty 4

## 2021-07-01 MED ORDER — CEFTRIAXONE SODIUM 500 MG IJ SOLR
500.0000 mg | Freq: Once | INTRAMUSCULAR | Status: AC
  Administered 2021-07-01: 500 mg via INTRAMUSCULAR
  Filled 2021-07-01: qty 500

## 2021-07-01 NOTE — ED Notes (Signed)
Urine sent to Lab

## 2021-07-01 NOTE — Discharge Instructions (Addendum)
Avoid sexual intercourse until cleared by your primary care doctor. ?Call your primary care doctor or specialist as discussed in the next 2-3 days.   ?Return immediately back to the ER if: ? ?Your symptoms worsen within the next 12-24 hours. ?You develop new symptoms such as new fevers, persistent vomiting, new pain, shortness of breath, or new weakness or numbness, or if you have any other concerns. ? ?

## 2021-07-01 NOTE — ED Triage Notes (Signed)
Pt has had pain and burning from the tip of his penis for a few days.  Pt has had recent exposure to gonorrhea.    ?

## 2021-07-01 NOTE — ED Provider Notes (Signed)
?[facility] EMERGENCY DEPT ?Provider Note ? ? ?CSN: [id number] ?Arrival date & time: 07/01/21  1430 ? ?  ? ?History ? ?Chief Complaint  ?Patient presents with  ? Exposure to STD  ? ? ?Virat Prather is a 26 y.o. male. ? ?Patient presents with concern for exposure to STD.  Denies any prior STDs in the past.  States he sexually active with his girlfriend.  She tested positive for gonorrhea a few days ago.  Patient states he has been having some discharge and burning sensation after urinating.  Denies fevers or cough or vomiting or diarrhea. ? ? ?  ? ?Home Medications ?Prior to Admission medications   ?Medication Sig Start Date End Date Taking? Authorizing Provider  ?doxycycline (VIBRAMYCIN) 100 MG capsule Take 1 capsule (100 mg total) by mouth 2 (two) times daily. 07/01/21  Yes Cheryll Cockayne, MD  ?citalopram (CELEXA) 20 MG tablet Take 1 tablet (20 mg total) by mouth daily. ?Patient not taking: Reported on 07/31/2019 05/27/17   Oneta Rack, NP  ?divalproex (DEPAKOTE ER) 250 MG 24 hr tablet Take 3 tablets (750 mg total) by mouth at bedtime. ?Patient not taking: Reported on 07/30/2017 05/27/17   Oneta Rack, NP  ?traZODone (DESYREL) 50 MG tablet Take 1 tablet (50 mg total) by mouth at bedtime as needed for sleep. ?Patient not taking: Reported on 07/31/2019 05/27/17   Oneta Rack, NP  ?   ? ?Allergies    ?Patient has no known allergies.   ? ?Review of Systems   ?Review of Systems  ?Constitutional:  Negative for fever.  ?HENT:  Negative for ear pain and sore throat.   ?Eyes:  Negative for pain.  ?Respiratory:  Negative for cough.   ?Cardiovascular:  Negative for chest pain.  ?Gastrointestinal:  Negative for abdominal pain.  ?Genitourinary:  Negative for flank pain.  ?Musculoskeletal:  Negative for back pain.  ?Skin:  Negative for color change and rash.  ?Neurological:  Negative for syncope.  ?All other systems reviewed and are negative. ? ?Physical Exam ?Updated Vital Signs ?BP 126/78 (BP Location: Right  Arm)   Pulse 70   Temp 98.3 ?F (36.8 ?C)   Resp 17   Ht 5\' 7"  (1.702 m)   Wt 68 kg   SpO2 99%   BMI 23.49 kg/m?  ?Physical Exam ?Constitutional:   ?   Appearance: He is well-developed.  ?HENT:  ?   Head: Normocephalic.  ?   Nose: Nose normal.  ?Eyes:  ?   Extraocular Movements: Extraocular movements intact.  ?Cardiovascular:  ?   Rate and Rhythm: Normal rate.  ?Pulmonary:  ?   Effort: Pulmonary effort is normal.  ?Abdominal:  ?   Tenderness: There is no abdominal tenderness. There is no guarding or rebound.  ?Skin: ?   Coloration: Skin is not jaundiced.  ?Neurological:  ?   Mental Status: He is alert. Mental status is at baseline.  ? ? ?ED Results / Procedures / Treatments   ?Labs ?(all labs ordered are listed, but only abnormal results are displayed) ?Labs Reviewed  ?URINALYSIS, ROUTINE W REFLEX MICROSCOPIC - Abnormal; Notable for the following components:  ?    Result Value  ? APPearance HAZY (*)   ? Specific Gravity, Urine 1.034 (*)   ? Protein, ur 30 (*)   ? Leukocytes,Ua LARGE (*)   ? WBC, UA >50 (*)   ? All other components within normal limits  ?GC/CHLAMYDIA PROBE AMP ([place]) NOT AT [facility]  ? ? ?  EKG ?None ? ?Radiology ?No results found. ? ?Procedures ?Procedures  ? ? ?Medications Ordered in ED ?Medications  ?azithromycin (ZITHROMAX) tablet 1,000 mg (has no administration in time range)  ?cefTRIAXone (ROCEPHIN) injection 500 mg (has no administration in time range)  ?lidocaine (PF) (XYLOCAINE) 1 % injection 1 mL (has no administration in time range)  ? ? ?ED Course/ Medical Decision Making/ A&P ?  ?                        ?Medical Decision Making ?Amount and/or Complexity of Data Reviewed ?Labs: ordered. ? ? ?Urinalysis shows leukocytes.  Gonorrhea chlamydia test sent and pending. ? ?Patient treated empirically while here.  Advised outpatient follow-up with his doctor this week.  Recommend immediate return for worsening symptoms or additional concerns.  Recommended no sexual intercourse until he is  cleared by his primary care doctor.  Patient was understanding, discharged home in stable condition. ? ? ? ? ? ? ? ?Final Clinical Impression(s) / ED Diagnoses ?Final diagnoses:  ?STD exposure  ? ? ?Rx / DC Orders ?ED Discharge Orders   ? ?      Ordered  ?  doxycycline (VIBRAMYCIN) 100 MG capsule  2 times daily       ? 07/01/21 1617  ? ?  ?  ? ?  ? ? ?  ?Cheryll Cockayne, MD ?07/01/21 1617 ? ?

## 2021-07-03 LAB — GC/CHLAMYDIA PROBE AMP (~~LOC~~) NOT AT ARMC
Chlamydia: NEGATIVE
Comment: NEGATIVE
Comment: NORMAL
Neisseria Gonorrhea: POSITIVE — AB

## 2022-01-15 ENCOUNTER — Ambulatory Visit (INDEPENDENT_AMBULATORY_CARE_PROVIDER_SITE_OTHER): Payer: Self-pay

## 2022-01-15 ENCOUNTER — Ambulatory Visit (HOSPITAL_COMMUNITY)
Admission: EM | Admit: 2022-01-15 | Discharge: 2022-01-15 | Disposition: A | Discharge status: Home / Self Care | Payer: Self-pay | Attending: Family Medicine | Admitting: Family Medicine

## 2022-01-15 ENCOUNTER — Encounter (HOSPITAL_COMMUNITY): Payer: Self-pay

## 2022-01-15 DIAGNOSIS — M79641 Pain in right hand: Secondary | ICD-10-CM

## 2022-01-15 MED ORDER — IBUPROFEN 800 MG PO TABS: 800.0000 mg | ORAL_TABLET | Freq: Three times a day (TID) | ORAL | 0 refills | Status: DC | PRN
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
## 2022-01-15 NOTE — Discharge Instructions (Addendum)
There is no broken bone on your x-rays.  Ice and elevate your hand today and tonight.  Take ibuprofen 800 mg--1 tab every 8 hours as needed for pain.

## 2022-01-19 ENCOUNTER — Encounter (HOSPITAL_COMMUNITY): Payer: Self-pay

## 2022-01-19 ENCOUNTER — Encounter (HOSPITAL_COMMUNITY): Payer: Self-pay | Admitting: Psychiatry

## 2022-01-19 ENCOUNTER — Emergency Department (HOSPITAL_COMMUNITY)
Admission: EM | Admit: 2022-01-19 | Discharge: 2022-01-19 | Disposition: A | Discharge status: Other Acute Inpatient Hospital | Payer: Medicaid Other | Attending: Emergency Medicine | Admitting: Emergency Medicine

## 2022-01-19 ENCOUNTER — Other Ambulatory Visit: Payer: Self-pay

## 2022-01-19 ENCOUNTER — Inpatient Hospital Stay (HOSPITAL_COMMUNITY)
Admission: AD | Admit: 2022-01-19 | Discharge: 2022-01-25 | DRG: 885 | Disposition: A | Discharge status: Home / Self Care | Payer: Federal, State, Local not specified - Other | Source: Intra-hospital | Attending: Psychiatry | Admitting: Psychiatry

## 2022-01-19 DIAGNOSIS — F1220 Cannabis dependence, uncomplicated: Secondary | ICD-10-CM | POA: Diagnosis present

## 2022-01-19 DIAGNOSIS — Z23 Encounter for immunization: Secondary | ICD-10-CM | POA: Diagnosis not present

## 2022-01-19 DIAGNOSIS — G4700 Insomnia, unspecified: Secondary | ICD-10-CM | POA: Diagnosis present

## 2022-01-19 DIAGNOSIS — F17210 Nicotine dependence, cigarettes, uncomplicated: Secondary | ICD-10-CM | POA: Diagnosis present

## 2022-01-19 DIAGNOSIS — T1491XA Suicide attempt, initial encounter: Secondary | ICD-10-CM

## 2022-01-19 DIAGNOSIS — F332 Major depressive disorder, recurrent severe without psychotic features: Secondary | ICD-10-CM | POA: Insufficient documentation

## 2022-01-19 DIAGNOSIS — X789XXA Intentional self-harm by unspecified sharp object, initial encounter: Secondary | ICD-10-CM | POA: Insufficient documentation

## 2022-01-19 DIAGNOSIS — R45851 Suicidal ideations: Secondary | ICD-10-CM | POA: Diagnosis present

## 2022-01-19 DIAGNOSIS — F1090 Alcohol use, unspecified, uncomplicated: Secondary | ICD-10-CM | POA: Insufficient documentation

## 2022-01-19 DIAGNOSIS — Z634 Disappearance and death of family member: Secondary | ICD-10-CM

## 2022-01-19 DIAGNOSIS — Z9151 Personal history of suicidal behavior: Secondary | ICD-10-CM

## 2022-01-19 DIAGNOSIS — F1010 Alcohol abuse, uncomplicated: Secondary | ICD-10-CM | POA: Diagnosis present

## 2022-01-19 DIAGNOSIS — F419 Anxiety disorder, unspecified: Secondary | ICD-10-CM | POA: Diagnosis present

## 2022-01-19 DIAGNOSIS — Z79899 Other long term (current) drug therapy: Secondary | ICD-10-CM | POA: Diagnosis not present

## 2022-01-19 DIAGNOSIS — Z7289 Other problems related to lifestyle: Secondary | ICD-10-CM | POA: Insufficient documentation

## 2022-01-19 DIAGNOSIS — F1410 Cocaine abuse, uncomplicated: Secondary | ICD-10-CM | POA: Insufficient documentation

## 2022-01-19 DIAGNOSIS — S51812A Laceration without foreign body of left forearm, initial encounter: Secondary | ICD-10-CM | POA: Insufficient documentation

## 2022-01-19 DIAGNOSIS — R7309 Other abnormal glucose: Secondary | ICD-10-CM | POA: Insufficient documentation

## 2022-01-19 DIAGNOSIS — Z1152 Encounter for screening for COVID-19: Secondary | ICD-10-CM | POA: Insufficient documentation

## 2022-01-19 LAB — COMPREHENSIVE METABOLIC PANEL
ALT: 17 U/L (ref 0–44)
AST: 25 U/L (ref 15–41)
Albumin: 4.3 g/dL (ref 3.5–5.0)
Alkaline Phosphatase: 33 U/L — ABNORMAL LOW (ref 38–126)
Anion gap: 9 (ref 5–15)
BUN: 13 mg/dL (ref 6–20)
CO2: 23 mmol/L (ref 22–32)
Calcium: 9.2 mg/dL (ref 8.9–10.3)
Chloride: 109 mmol/L (ref 98–111)
Creatinine, Ser: 1.23 mg/dL (ref 0.61–1.24)
GFR, Estimated: >60 mL/min (ref >60)
Glucose, Bld: 85 mg/dL (ref 70–99)
Potassium: 3.9 mmol/L (ref 3.5–5.1)
Sodium: 141 mmol/L (ref 135–145)
Total Bilirubin: 0.6 mg/dL (ref 0.3–1.2)
Total Protein: 7.3 g/dL (ref 6.5–8.1)

## 2022-01-19 LAB — RAPID URINE DRUG SCREEN, HOSP PERFORMED
Amphetamines: NOT DETECTED
Barbiturates: NOT DETECTED
Benzodiazepines: NOT DETECTED
Cocaine: POSITIVE — AB
Opiates: NOT DETECTED
Tetrahydrocannabinol: POSITIVE — AB

## 2022-01-19 LAB — CBC WITH DIFFERENTIAL/PLATELET
Abs Immature Granulocytes: 0.04 10*3/uL (ref 0.00–0.07)
Basophils Absolute: 0.1 10*3/uL (ref 0.0–0.1)
Basophils Relative: 1 %
Eosinophils Absolute: 0.3 10*3/uL (ref 0.0–0.5)
Eosinophils Relative: 2 %
HCT: 49.4 % (ref 39.0–52.0)
Hemoglobin: 17 g/dL (ref 13.0–17.0)
Immature Granulocytes: 0 %
Lymphocytes Relative: 15 %
Lymphs Abs: 1.7 10*3/uL (ref 0.7–4.0)
MCH: 31.8 pg (ref 26.0–34.0)
MCHC: 34.4 g/dL (ref 30.0–36.0)
MCV: 92.3 fL (ref 80.0–100.0)
Monocytes Absolute: 0.8 10*3/uL (ref 0.1–1.0)
Monocytes Relative: 7 %
Neutro Abs: 8.7 10*3/uL — ABNORMAL HIGH (ref 1.7–7.7)
Neutrophils Relative %: 75 %
Platelets: 325 10*3/uL (ref 150–400)
RBC: 5.35 MIL/uL (ref 4.22–5.81)
RDW: 13.4 % (ref 11.5–15.5)
WBC: 11.6 10*3/uL — ABNORMAL HIGH (ref 4.0–10.5)
nRBC: 0 % (ref 0.0–0.2)

## 2022-01-19 LAB — RESP PANEL BY RT-PCR (FLU A&B, COVID) ARPGX2
Influenza A by PCR: NEGATIVE
Influenza B by PCR: NEGATIVE
SARS Coronavirus 2 by RT PCR: NEGATIVE

## 2022-01-19 LAB — ETHANOL: Alcohol, Ethyl (B): <10 mg/dL (ref <10)

## 2022-01-19 LAB — ACETAMINOPHEN LEVEL: Acetaminophen (Tylenol), Serum: <10 ug/mL — ABNORMAL LOW (ref 10–30)

## 2022-01-19 LAB — SALICYLATE LEVEL: Salicylate Lvl: <7 mg/dL — ABNORMAL LOW (ref 7.0–30.0)

## 2022-01-19 LAB — CBG MONITORING, ED: Glucose-Capillary: 91 mg/dL (ref 70–99)

## 2022-01-19 MED ORDER — NICOTINE POLACRILEX 2 MG MT GUM: 2.0000 mg | CHEWING_GUM | OROMUCOSAL | Status: DC | PRN

## 2022-01-19 MED ORDER — ALUM & MAG HYDROXIDE-SIMETH 200-200-20 MG/5ML PO SUSP: 30.0000 mL | ORAL | Status: DC | PRN

## 2022-01-19 MED ORDER — PNEUMOCOCCAL VAC POLYVALENT 25 MCG/0.5ML IJ INJ
0.5000 mL | INJECTION | INTRAMUSCULAR | Status: AC
  Administered 2022-01-21: 0.5 mL via INTRAMUSCULAR
  Filled 2022-01-19: qty 0.5

## 2022-01-19 MED ORDER — ACETAMINOPHEN 325 MG PO TABS
650.0000 mg | ORAL_TABLET | Freq: Four times a day (QID) | ORAL | Status: DC | PRN
  Administered 2022-01-19: 650 mg via ORAL
  Filled 2022-01-19: qty 2

## 2022-01-19 MED ORDER — TRAZODONE HCL 50 MG PO TABS
50.0000 mg | ORAL_TABLET | Freq: Every evening | ORAL | Status: DC | PRN
  Administered 2022-01-19 – 2022-01-24 (×6): 50 mg via ORAL
  Filled 2022-01-19 (×2): qty 1
  Filled 2022-01-19: qty 7
  Filled 2022-01-19 (×4): qty 1

## 2022-01-19 MED ORDER — INFLUENZA VAC SPLIT QUAD 0.5 ML IM SUSY
0.5000 mL | PREFILLED_SYRINGE | INTRAMUSCULAR | Status: AC
  Administered 2022-01-21: 0.5 mL via INTRAMUSCULAR
  Filled 2022-01-19: qty 0.5

## 2022-01-19 MED ORDER — MAGNESIUM HYDROXIDE 400 MG/5ML PO SUSP: 30.0000 mL | Freq: Every day | ORAL | Status: DC | PRN

## 2022-01-19 MED ORDER — HYDROXYZINE HCL 25 MG PO TABS
25.0000 mg | ORAL_TABLET | Freq: Three times a day (TID) | ORAL | Status: DC | PRN
  Administered 2022-01-19 – 2022-01-24 (×6): 25 mg via ORAL
  Filled 2022-01-19: qty 1
  Filled 2022-01-19: qty 10
  Filled 2022-01-19 (×5): qty 1

## 2022-01-19 NOTE — ED Provider Notes (Signed)
  [facility] Emergency Department Provider Note MRN:  [id number]  Arrival date & time: 01/19/22     Chief Complaint   Extremity Laceration   History of Present Illness   Jonathan [name] is a 26 y.o. year-old male presents to the ED with chief complaint of  suicide attempt.  Patient states that he cut his arm tonight with intent to kill himself.  He states that he still feels suicidal now.  He states that he has not been taking any of his medications..  History provided by patient.   Review of Systems  Pertinent positive and negative review of systems noted in HPI.    Physical Exam   Vitals:   01/19/22 0455  BP: 131/81  Resp: 16  Temp: 98.3 F (36.8 C)  SpO2: 100%    CONSTITUTIONAL:  depressed-appearing, NAD NEURO:  Alert and oriented x 3, CN 3-12 grossly intact EYES:  eyes equal and reactive ENT/NECK:  Supple, no stridor  CARDIO:  normal rate, appears well-perfused  PULM:  No respiratory distress,  GI/GU:  non-distended,  MSK/SPINE:  No gross deformities, no edema, moves all extremities  SKIN:  no rash, numerous shallow lacerations to left forearm   *Additional and/or pertinent findings included in MDM below  Diagnostic and Interventional Summary    EKG Interpretation  Date/Time:    Ventricular Rate:    PR Interval:    QRS Duration:   QT Interval:    QTC Calculation:   R Axis:     Text Interpretation:         Labs Reviewed  RESP PANEL BY RT-PCR (FLU A&B, COVID) [id number]  COMPREHENSIVE METABOLIC PANEL  SALICYLATE LEVEL  ACETAMINOPHEN LEVEL  ETHANOL  RAPID URINE DRUG SCREEN, HOSP PERFORMED  CBC WITH DIFFERENTIAL/PLATELET  CBG MONITORING, ED    No orders to display    Medications - No data to display   Procedures  /  Critical Care Procedures  ED Course and Medical Decision Making  I have reviewed the triage vital signs, the nursing notes, and pertinent available records from the EMR.  Social Determinants Affecting  Complexity of Care: Patient has no clinically significant social determinants affecting this chief complaint..   ED Course:    Medical Decision Making Patient her with SI.    Cut himself numerous times to the left forearm.  Will updated tdap.  Wound care to the shallow wounds.  Will consult TTS and check labs.  5:12 AM IVC papers completed.  Amount and/or Complexity of Data Reviewed Labs: ordered.     Consultants: TTS consult pending.   Treatment and Plan: Disposition per TTS.  Anticipated psychiatric admission.    Final Clinical Impressions(s) / ED Diagnoses     ICD-10-CM   1. Suicide attempt ([place])  T14.91XA     2. Deliberate self-cutting  Z72.89       ED Discharge Orders     None         Discharge Instructions Discussed with and Provided to Patient:   Discharge Instructions   None      Montine Circle, PA-C 01/19/22 [place], [place], DO 01/19/22 [phone]

## 2022-01-19 NOTE — ED Notes (Signed)
Per Gwyndolyn Saxon RN will perform EKG on arrival.

## 2022-01-19 NOTE — Progress Notes (Signed)
Pt under review at [facility] PENDING IVC paperwork being faxed to [phone] and physician note signed that patient is medically cleared.   Care Team notified: [facility], RN, Oris Drone, RN, Benjamine Sprague, RN, [employer], RN   Benjaman Kindler, MSW, [facility] 01/19/2022 1:19 PM
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
## 2022-01-19 NOTE — Progress Notes (Signed)
   01/19/22 2000  Psychosocial Assessment  Patient Complaints Anxiety;Depression  Eye Contact Fair  Facial Expression Anxious  Affect Anxious;Depressed  Speech Logical/coherent  Interaction Assertive  Motor Activity Other (Comment) (WNL)  Appearance/Hygiene Unremarkable  Behavior Characteristics Cooperative  Mood Depressed;Anxious;Pleasant  Thought Process  Coherency WDL  Content WDL  Delusions None reported or observed  Perception WDL  Hallucination None reported or observed  Judgment Limited  Confusion None  Danger to Self  Current suicidal ideation? Denies  Danger to Others  Danger to Others None reported or observed

## 2022-01-19 NOTE — BH Assessment (Addendum)
Comprehensive Clinical Assessment (CCA) Note  01/19/2022 Jonathan [name] [id number] Disposition: Clinician discussed patient care with Jonathan Guadeloupe, NP.  He recommends inpatient care.  CSW to assist with placement.    Patient has a flat affect and speaks in a monotone.  He is not responding to internal stimuli.  Patient does not evidence any delusional thought processes.  He is able to talk in a coherent, logical manner.  Pt reports not eating in a day due to lack of appetite.  He says he has not slept in two days.  Pt missed three days of work due to depression and lack of motivation.  Patient has no current outpatient providers.  Has not been on meds in over a year.  Patient was at [facility] in 05/2017 and 08/2015.   Chief Complaint:  Chief Complaint  Patient presents with   Extremity Laceration   Visit Diagnosis: MDD recurrent, severe    CCA Screening, Triage and Referral (STR)  Patient Reported Information How did you hear about [place]? Other (Comment) (Pt called 911)  What Is the Reason for Your Visit/Call Today? Pt made a cut to his left arm tonight in an effort to kill himself.  He called 911 after doing so.  This is his 2nd attempt within 3 years.  At ths previous attempt he tried to cut himself, then went to [facility] as a result.  Pt has been off meds for over a year.  Pt has no outpatient services.  Pt is still feeling suicidal and feels that every step he takes forward, he gets knocked back two steps.  Patient denies any HI or A/V hallucinations.  Patient  denies use of ETOH or other substances.  Pt denies having any access to a gun.  Pt says he has not ate anything since yesterday and has not slept.  How Long Has This Been Causing You Problems? 1-6 months  What Do You Feel Would Help You the Most Today? Treatment for Depression or other mood problem   Have You Recently Had Any Thoughts About Hurting Yourself? Yes  Are You Planning to Commit Suicide/Harm Yourself At This time?  Yes   Have you Recently Had Thoughts About Hurting Someone Jonathan [name]? No  Are You Planning to Harm Someone at This Time? No  Explanation: No data recorded  Have You Used Any Alcohol or Drugs in the Past 24 Hours? No  How Long Ago Did You Use Drugs or Alcohol? No data recorded What Did You Use and How Much? No data recorded  Do You Currently Have a Therapist/Psychiatrist? No  Name of Therapist/Psychiatrist: No data recorded  Have You Been Recently Discharged From Any Office Practice or Programs? No  Explanation of Discharge From Practice/Program: No data recorded    CCA Screening Triage Referral Assessment Type of Contact: Tele-Assessment  Telemedicine Service Delivery:   Is this Initial or Reassessment? Initial Assessment  Date Telepsych consult ordered in [facility]:  01/19/22  Time Telepsych consult ordered in [facility]:  0505  Location of Assessment: [facility]  Provider Location: [facility] Assessment Services   Collateral Involvement: No data recorded  Does Patient Have a Court Appointed Legal Guardian? No  Legal Guardian Contact Information: No data recorded Copy of Legal Guardianship Form: No data recorded Legal Guardian Notified of Arrival: No data recorded Legal Guardian Notified of Pending Discharge: No data recorded If Minor and Not Living with Parent(s), Who has Custody? No data recorded Is CPS involved or ever been involved? No data recorded Is  APS involved or ever been involved? Never   Patient Determined To Be At Risk for Harm To Self or Others Based on Review of Patient Reported Information or Presenting Complaint? Yes, for Self-Harm  Method: No data recorded Availability of Means: No data recorded Intent: No data recorded Notification Required: No data recorded Additional Information for Danger to Others Potential: No data recorded Additional Comments for Danger to Others Potential: No data recorded Are There Guns or Other Weapons in Your Home? No data recorded Types  of Guns/Weapons: No data recorded Are These Weapons Safely Secured?                            No data recorded Who Could Verify You Are Able To Have These Secured: No data recorded Do You Have any Outstanding Charges, Pending Court Dates, Parole/Probation? No data recorded Contacted To Inform of Risk of Harm To Self or Others: No data recorded   Does Patient Present under Involuntary Commitment? No  IVC Papers Initial File Date: No data recorded  [place] of Residence: [place]   Patient Currently Receiving the Following Services: Not Receiving Services   Determination of Need: Urgent (48 hours)   Options For Referral: Inpatient Hospitalization     CCA Biopsychosocial Patient Reported Schizophrenia/Schizoaffective Diagnosis in Past: No   Strengths: Pt cannot identify any strengths.   Mental Health Symptoms Depression:   Fatigue; Sleep (too much or little); Hopelessness; Worthlessness; Difficulty Concentrating   Duration of Depressive symptoms:  Duration of Depressive Symptoms: Greater than two weeks   Mania:   None   Anxiety:    Worrying   Psychosis:   None   Duration of Psychotic symptoms:    Trauma:   Detachment from others   Obsessions:   None   Compulsions:   None   Inattention:   None   Hyperactivity/Impulsivity:  No data recorded  Oppositional/Defiant Behaviors:   N/A   Emotional Irregularity:   Chronic feelings of emptiness   Other Mood/Personality Symptoms:  No data recorded   Mental Status Exam Appearance and self-care  Stature:   Average   Weight:   Average weight   Clothing:   Casual   Grooming:   Neglected   Cosmetic use:  No data recorded  Posture/gait:   Normal   Motor activity:   Not Remarkable   Sensorium  Attention:   Normal   Concentration:   Anxiety interferes   Orientation:   X5   Recall/memory:   Normal   Affect and Mood  Affect:   Blunted; Flat   Mood:   Depressed   Relating  Eye  contact:   Normal   Facial expression:   Depressed   Attitude toward examiner:   Cooperative   Thought and Language  Speech flow:  Clear and Coherent   Thought content:   Appropriate to Mood and Circumstances   Preoccupation:   Suicide   Hallucinations:   None   Organization:   Coherent; Intact   [employer] of Knowledge:   Average   Intelligence:   Average   Abstraction:   Popular   Judgement:   Poor   Reality Testing:   Realistic   Insight:   Fair   Decision Making:   Normal   Social Functioning  Social Maturity:   Isolates   Social Judgement:   Heedless   Stress  Stressors:   Grief/losses; Financial   Coping Ability:  Deficient supports; Overwhelmed   Skill Deficits:   Communication; Interpersonal   Supports:   Support needed     Religion: Religion/Spirituality Are You A Religious Person?: No  Leisure/Recreation: Leisure / Recreation Do You Have Hobbies?: No  Exercise/Diet: Exercise/Diet Do You Exercise?: No Have You Gained or Lost A Significant Amount of Weight in the Past Six Months?: Yes-Lost Number of Pounds Lost?: 10 Do You Follow a Special Diet?: No Do You Have Any Trouble Sleeping?: Yes Explanation of Sleeping Difficulties: Has not slept in two nights.   CCA Employment/Education Employment/Work Situation: Employment / Work Situation Employment Situation: Employed Work Stressors: Pt works at [employer].  Does not care for his job. Patient's Job has Been Impacted by Current Illness: Yes Describe how Patient's Job has Been Impacted: Missed work at job because he wasn't going due to low mood Has Patient ever Been in the [employer]?: No  Education: Education Is Patient Currently Attending School?: No Last Grade Completed: 11 (completed his GED.) Did You Attend College?: No Did You Have An Individualized Education Program (IIEP): No Did You Have Any Difficulty At School?: No Patient's Education  Has Been Impacted by Current Illness: No   CCA Family/Childhood History Family and Relationship History: Family history Marital status: Single Does patient have children?: Yes How many children?: 1 How is patient's relationship with their children?: Pt has a 62 year old son.  Has not seen him in two months.  Childhood History:  Childhood History By whom was/is the patient raised?: Mother/father and step-parent Did patient suffer any verbal/emotional/physical/sexual abuse as a child?: No Did patient suffer from severe childhood neglect?: No Has patient ever been sexually abused/assaulted/raped as an adolescent or adult?: No Was the patient ever a victim of a crime or a disaster?: No Witnessed domestic violence?: No Has patient been affected by domestic violence as an adult?: No  Child/Adolescent Assessment:     CCA Substance Use Alcohol/Drug Use: Alcohol / Drug Use Pain Medications: Pt denies Prescriptions: No medications Over the Counter: None. History of alcohol / drug use?: Yes (Pt denies currrent usage.) Longest period of sobriety (when/how long): Unknown                         ASAM's:  Six Dimensions of Multidimensional Assessment  Dimension 1:  Acute Intoxication and/or Withdrawal Potential:      Dimension 2:  Biomedical Conditions and Complications:      Dimension 3:  Emotional, Behavioral, or Cognitive Conditions and Complications:     Dimension 4:  Readiness to Change:     Dimension 5:  Relapse, Continued use, or Continued Problem Potential:     Dimension 6:  Recovery/Living Environment:     ASAM Severity Score:    ASAM Recommended Level of Treatment:     Substance use Disorder (SUD)    Recommendations for Services/Supports/Treatments:    Discharge Disposition:    DSM5 Diagnoses: Patient Active Problem List   Diagnosis Date Noted   Pneumonia 05/23/2017   Major depressive disorder, recurrent severe without psychotic features (HCC)  05/22/2017     Referrals to Alternative Service(s): Referred to Alternative Service(s):   Place:   Date:   Time:    Referred to Alternative Service(s):   Place:   Date:   Time:    Referred to Alternative Service(s):   Place:   Date:   Time:    Referred to Alternative Service(s):   Place:   Date:   Time:  Raymondo Band, LCAS

## 2022-01-19 NOTE — Progress Notes (Addendum)
Pt was involuntarily admitted at [facility] after having been at [facility] for SI with self-inflicted lacerations to left arm via razor blade.  Pt says triggers are "the stressors of life, family and financial."  Pt has had two previous hospitalizations at [facility], the last one in 2019. Pt says that he drinks alcohol 2 to 3 times per week and drinks two beers only each time pt drinks alcohol.  Pt says he smokes cigarettes daily and requested nicotine patch.  Pt says he's lost 10 lbs recently due to life stressors and his inability to eat due to poor appetite.  Pt also has problems with lowered concentration.  Pt said his goals are to learn "better anger and stress management."  VSS on admission.  Pt was cooperative during admission visit.  Pt signed admission paperwork and verbalized understanding of plan of care.  When pt arrived at [facility], pt's arm had not been cleaned or dressed.  Dried blood on pt's arm.  Pt took showered once he got on the unit and he washed arm with soap and water.  RN applied dressing to pt's left arm. Pt is adjusting on the unit and doing well at this time.

## 2022-01-19 NOTE — ED Notes (Signed)
Pt. accepted to [facility], room 403-01.  Attending physician: Dr. Caswell Corwin, Dx MDD.  Please call report to [phone].  Room ready at [place].

## 2022-01-19 NOTE — Tx Team (Signed)
Initial Treatment Plan 01/19/2022 7:07 PM [name] GQB:[id number]    PATIENT STRESSORS: Financial difficulties   Marital or family conflict     PATIENT STRENGTHS: Capable of independent living  Physical Health  Supportive family/friends    PATIENT IDENTIFIED PROBLEMS: Suicidal ideation  Depression  "Feel empty"                 DISCHARGE CRITERIA:  Ability to meet basic life and health needs Improved stabilization in mood, thinking, and/or behavior  PRELIMINARY DISCHARGE PLAN: Outpatient therapy Participate in family therapy Return to previous living arrangement  PATIENT/FAMILY INVOLVEMENT: This treatment plan has been presented to and reviewed with the patient, Jonathan [name], The patient has been given the opportunity to ask questions and make suggestions.  Luna Glasgow, RN 01/19/2022, 7:07 PM

## 2022-01-19 NOTE — Progress Notes (Addendum)
Pt was accepted to [facility] 01/19/22; Bed Assignment 403-1   Pt meets inpatient criteria per Lindon Romp, NP  Attending Physician will be Dr. Caswell Corwin  Report can be called to: Adult unit: [phone]  Pt can arrive after 3pm  Care Team notified: Dixie, RN, Oris Drone, RN, Benjamine Sprague, RN, Crecencio Mc, Therapist, sports. Michaelene Song, RN  Nadara Mode, LCSWA 01/19/2022 @ 3:18 PM

## 2022-01-19 NOTE — Progress Notes (Signed)
Adult Psychoeducational Group Note  Date:  01/19/2022 Time:  8:50 PM  Group Topic/Focus:  Wrap-Up Group:   The focus of this group is to help patients review their daily goal of treatment and discuss progress on daily workbooks.  Participation Level:  Active  Participation Quality:  Appropriate  Affect:  Appropriate  Cognitive:  Appropriate  Insight: Appropriate  Engagement in Group:  Engaged  Modes of Intervention:  Education and Exploration  Additional Comments:  Patient attended,however did not participated in group tonight.  Salley Scarlet [facility] 01/19/2022, 8:50 PM

## 2022-01-20 DIAGNOSIS — F1220 Cannabis dependence, uncomplicated: Secondary | ICD-10-CM | POA: Insufficient documentation

## 2022-01-20 DIAGNOSIS — F1410 Cocaine abuse, uncomplicated: Secondary | ICD-10-CM | POA: Insufficient documentation

## 2022-01-20 DIAGNOSIS — F1090 Alcohol use, unspecified, uncomplicated: Secondary | ICD-10-CM | POA: Insufficient documentation

## 2022-01-20 LAB — URINALYSIS, ROUTINE W REFLEX MICROSCOPIC
Bilirubin Urine: NEGATIVE
Glucose, UA: NEGATIVE mg/dL
Hgb urine dipstick: NEGATIVE
Ketones, ur: NEGATIVE mg/dL
Leukocytes,Ua: NEGATIVE
Nitrite: NEGATIVE
Protein, ur: NEGATIVE mg/dL
Specific Gravity, Urine: 1.01 (ref 1.005–1.030)
pH: 6.5 (ref 5.0–8.0)

## 2022-01-20 LAB — HEMOGLOBIN A1C
Hgb A1c MFr Bld: 5.1 % (ref 4.8–5.6)
Mean Plasma Glucose: 99.67 mg/dL

## 2022-01-20 LAB — LIPID PANEL
Cholesterol: 196 mg/dL (ref 0–200)
HDL: 37 mg/dL — ABNORMAL LOW (ref >40)
LDL Cholesterol: 131 mg/dL — ABNORMAL HIGH (ref 0–99)
Total CHOL/HDL Ratio: 5.3 RATIO
Triglycerides: 140 mg/dL (ref <150)
VLDL: 28 mg/dL (ref 0–40)

## 2022-01-20 LAB — TSH: TSH: 0.876 u[IU]/mL (ref 0.350–4.500)

## 2022-01-20 MED ORDER — THIAMINE HCL 100 MG/ML IJ SOLN: 100.0000 mg | Freq: Once | INTRAMUSCULAR | Status: DC

## 2022-01-20 MED ORDER — LOPERAMIDE HCL 2 MG PO CAPS: 2.0000 mg | ORAL_CAPSULE | ORAL | Status: AC | PRN

## 2022-01-20 MED ORDER — VITAMIN B-1 100 MG PO TABS
100.0000 mg | ORAL_TABLET | Freq: Every day | ORAL | Status: DC
  Administered 2022-01-21 – 2022-01-25 (×5): 100 mg via ORAL
  Filled 2022-01-20 (×6): qty 1

## 2022-01-20 MED ORDER — SERTRALINE HCL 100 MG PO TABS
100.0000 mg | ORAL_TABLET | Freq: Every day | ORAL | Status: DC
  Administered 2022-01-21 – 2022-01-22 (×2): 100 mg via ORAL
  Filled 2022-01-20 (×4): qty 1

## 2022-01-20 MED ORDER — LORAZEPAM 1 MG PO TABS: 1.0000 mg | ORAL_TABLET | Freq: Four times a day (QID) | ORAL | Status: AC | PRN

## 2022-01-20 MED ORDER — ADULT MULTIVITAMIN W/MINERALS CH
1.0000 | ORAL_TABLET | Freq: Every day | ORAL | Status: DC
  Administered 2022-01-21 – 2022-01-25 (×5): 1 via ORAL
  Filled 2022-01-20 (×7): qty 1

## 2022-01-20 MED ORDER — ONDANSETRON 4 MG PO TBDP: 4.0000 mg | ORAL_TABLET | Freq: Four times a day (QID) | ORAL | Status: AC | PRN

## 2022-01-20 MED ORDER — SERTRALINE HCL 50 MG PO TABS
50.0000 mg | ORAL_TABLET | Freq: Every day | ORAL | Status: DC
  Administered 2022-01-20: 50 mg via ORAL
  Filled 2022-01-20 (×2): qty 1

## 2022-01-20 MED ORDER — NEOMYCIN-POLYMYXIN-PRAMOXINE 1 % EX CREA
TOPICAL_CREAM | Freq: Two times a day (BID) | CUTANEOUS | Status: DC
  Administered 2022-01-21: 1 via TOPICAL
  Filled 2022-01-20: qty 28
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
## 2022-01-20 NOTE — Progress Notes (Signed)
Pt in bed this morning. Pt ate breakfast in room. Dressing changed to wounds on left arm, TAO applied. No signs of infection noted. Pt denies pain. Pt presents with depressed affect, denies current suicidal thoughts. Group attendance encouraged, Q 15 minute checks ongoing for safety.

## 2022-01-20 NOTE — Group Note (Signed)
[place] LCSW Group Therapy Note  01/20/2022  10:00-11:00AM  Type of Therapy and Topic:  Group Therapy:  Grief   Participation Level:  Active   Description of Group:  Patients in this group asked to talk about grief during this session.  As the topic was introduced, we talked about grief involving death much of the time, but also that it is the emotion we may experience at the loss of a relationship, job, pet, etc.  Patients were introduced to the well-known [employer] of 5 stages of grief (Denial, Anger, Bargaining, Depression, Acceptance) and the fact that these stages are often not linear and people may go through one or more stages numerous times, although with greater ease each subsequent time.  This led to a discussion about patients' sources of grief and what their experiences have been in going through this process.  Therapeutic Goals: 1)  learn about the 5 stages of grief 2)  share patients' direct experiences in going through these stages  3)  give an opportunity to each patient to say out loud what they would like to say to a person they are currently grieving  4) discuss actions that could possibly help in the grief process such as writing a letter, journaling, listening to music, exercising, saying names out loud, and more   Summary of Patient Progress:  The patient's most impactful loss in his life has been the deaths 3 years ago of his mother and both paternal and maternal grandmothers in the same year.  The patient expressed himself insightfully and showed support to other patients.  Therapeutic Modalities:   Motivational Interviewing Processing  Maretta Los

## 2022-01-20 NOTE — BHH Group Notes (Signed)
Adult Goals Group Note  Date:  01/20/2022 Time:  11:55 AM  Group Topic/Focus:  Goals Group:   The focus of this group is to help patients establish daily goals to achieve during treatment and discuss how the patient can incorporate goal setting into their daily lives to aide in recovery.  Participation Level:  Did Not Attend 

## 2022-01-20 NOTE — H&P (Signed)
Psychiatric Admission Assessment Adult  Patient Identification: Jonathan [name] MRN:  [id number] Date of Evaluation:  01/20/2022 Chief Complaint:  MDD (major depressive disorder), recurrent episode, severe ([place]) [F33.2] Principal Diagnosis: MDD (major depressive disorder), recurrent severe, without psychosis ([place]) Diagnosis:  Principal Problem:   MDD (major depressive disorder), recurrent severe, without psychosis ([place]) Active Problems:   Alcohol use disorder   Delta-9-tetrahydrocannabinol (THC) dependence ([place])   Cocaine use disorder ([place])  History of Present Illness: Jonathan [name] is a 26 year old African-American male with a prior mental health history of MDD who was taken to the [place] ED by EMS after a suicide attempt in which he made cuts to his left wrist, and called 911 in the context of socioeconomic stressors & polysubstance abuse.  Patient was involuntarily transferred to this [facility] for treatment and stabilization of his mood.    Assessment today, patient reports worsening depressive symptoms over the course of a year now; patient reports that in this time frame, he has had gradually worsening of his ability to concentrate, decreased energy levels, insomnia, decreased appetite, feelings of irritability, anhedonia, frustration, anger, as well as feelings of helplessness and hopelessness and worthlessness.  Patient reports that his stressors of financial and family stressors.  He reports that he has been having trouble keeping up with his bills, and has not seen his 67-year-old son in over 2 months.  He reports that the child's mother took the child and moved to [place] and blocked his calls, and he has been unable to reach them.  Patient reports that after the last hospitalization that he had at this hospital in 2019, he stopped taking his medications 2 weeks after being discharged.  He reports that he also did not follow-up with the discharge plan that was made for him during that  hospitalization, and states that his medications at the time were not helpful.  Past Psychiatric Hx: Patient reports 2 prior mental health related hospitalizations at this hospital.  As per chart review, patient was hospitalized at this hospital from 08/28/2015 through 08/31/2015.  For that hospitalization, his diagnosis was MDD without psychotic features.  For that hospitalization, patient had a similar presentation and made cuts to his forearms after an argument with his girlfriend.  During that hospitalization, he reported that his stressors were the failing health of his mother and grandmother.  Patient was again admitted to this hospital from 05/22/2017 through 05/27/2017.  During that hospitalization, patient reported that he felt down and depressed and suicidal with a plan to cut his arms due to the death of his grandmother in 04/17/22 of that year.  Patient also shared during that hospitalization that he had been self-injuring to relieve stress.  Patient reports a history of manic-type symptoms that last 2 to 3 days; he reports hypersexuality during those times, reports increased energy levels during this period, and reports a decrease in his appetite and not feeling hungry, not feeling tired and functioning on a very little sleep during those times. Upon further questioning, patient reports that the manic type behaviors only happened in the context of cocaine use.  Past diagnosis of bipolar disorder is therefore questionable, as patient reports that he has never experienced these manic type symptoms in the absence of cocaine use.  Substance use history:  During this assessment, patient was not entirely forthcoming regarding his substance use until he was confronted about his tox screen being positive for cocaine and marijuana.  Patient initially denied use of both substances, but later shared  that he started cocaine use when he was 26 years old, and he uses it every day, using as much as he can get on  a daily basis.  He is unable to quantify how much cocaine he uses in a day.  He reports that he only uses marijuana a few times in a month.  He reports that he started using [facility] when he was 26 years old.  Patient also reports that he uses alcohol and that his alcohol habit started when he was 26 years old.  He shares that he has a history of using liquor but currently does not use liquor.  He reports that he drinks 2 beers which consist of 24 ounces each every other day.  Patient reports that his last drink was the night prior to presentation to the ER.  He denies any history of blackouts related to alcohol use, and denies any history of seizures related to alcohol withdrawals.  Tox screen had a BAL of less than 10.  Patient reports that he currently smokes cigarettes daily but is not interested in a nicotine patch.  He has been educated that one will be ordered for him, just in case he needs it.  Patient denies any other recreational drug use other than the ones mentioned above.  Past psychiatric medication history: Patient reports that he is unable to recall past psychotropic medications. During the hospitalization that patient had in 07/20/15, he was prescribed citalopram 10 mg daily for depressive symptoms, and Depakote 500 mg for mood stabilization.  Trazodone 50 mg nightly was ordered for sleep.  These medications remained unchanged during the rehospitalization in 07/19/2017.  There was an increase in dose to citalopram 20 mg and Depakote was adjusted to 750 mg prior to that discharge.  Patient shares that after the hospitalization in 07-19-2017, he stopped taking his medications 2 weeks after being discharged, and also did not follow-up with his outpatient appointments.  Family history:  Patient reports a history of crack cocaine addiction in his biological mother.  He denies any other mental health conditions in his family.  He denies any completed suicides in his family.  He reports that his mother died in 07/19/17,  but that she was sick and reports not feeling comfortable disclosing her cause of death.  Past Medical History: Denies any current or past medical conditions.  Prior Surgeries: none Head trauma, LOC, concussions, seizures: Denies a history of seizures, head trauma or concussions. Allergies: Denies allergies to food or medications. Contraception: none PCP: Denies having a current PCP. Mental health provider: Denies having a current mental health provider, reports wanting mental health provider prior to discharge and also reports wanting a therapist to help him process the loss of his mother and grandmother.  CSW will be made aware.  Additional Social History: Pt reports that he was born in [place], and was raised here in [place].  He reports that his father resides in [place], but that he has no contact with his father.  He reports that he has 1 child age 47 years old who is in [place], and has 2 siblings (a brother and a sister with whom he has no contact).  He reports that his siblings are in their 29s currently but are not supportive of him, but knows where he lives.  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 He  reports finances as a huge stressor since he does not make enough money at that job. Pt reports that he lives by himself in an apartment in [place], and has no support system.  Current Presentation: During this encounter, pt presents with a depressed mood, & affect is congruent. He however is oriented to person, place, time & situation, and knows the current president.  His attention to personal hygiene and grooming is fair, eye contact is fair, speech is clear & coherent. Thought contents are organized and logical, and pt currently presents with passive SI, verbally contracts for safety on the unit, denies any intent or plan to self harm  while on unit. He denies HI/AVH,denies paranoia and verbally contracts for safety on the unit.  Medication plan: Pt reports that Celexa in the past was not helpful, and has been started on Zoloft 50 mg today and increasing to 100 mg tomorrow 01/21/2022 for management of depressive symptoms and anxiety. CIWAs ordered every 6 hours with Ativan 1 mg coverage for CIWAs over 10. Will add Hydroxyzine 25 mg PRN for anxiety and Trazodone 50 mg PRN nightly for insomnia.  Labs independently reviewed on 01/21/22: Tox scree + Cocaine and + THC, BAL <10, TSL WNL, Lipid panel with LDL of 131, educated on healthy food choices and exercise, and the fact that will need PCP f/u after discharge. HA1C WNL, QTC 379. Baseline UA ordered, Vits D & B12 levels ordered.  Associated Signs/Symptoms: Depression Symptoms:  depressed mood, anhedonia, insomnia, feelings of worthlessness/guilt, difficulty concentrating, hopelessness, suicidal thoughts without plan, suicidal attempt, anxiety, loss of energy/fatigue, disturbed sleep, weight loss, Duration of Depression Symptoms: Greater than two weeks  (Hypo) Manic Symptoms:  Impulsivity, Anxiety Symptoms:  Excessive Worry, Psychotic Symptoms:   n/a PTSD Symptoms: NA Total Time spent with patient: 1.5 hours  Past Psychiatric History: MDD, polysubstance use  Is the patient at risk to self? Yes.    Has the patient been a risk to self in the past 6 months? Yes.    Has the patient been a risk to self within the distant past? Yes.    Is the patient a risk to others? No.  Has the patient been a risk to others in the past 6 months? No.  Has the patient been a risk to others within the distant past? No.   [place] Scale:  [facility] Admission (Current) from 01/19/2022 in [facility] 400B Most recent reading at 01/19/2022  5:45 PM ED from 01/19/2022 in [place] DEPT Most recent reading at 01/19/2022  5:44  AM ED from 01/15/2022 in [facility] Most recent reading at 01/15/2022 11:23 AM  C-SSRS RISK CATEGORY Low Risk High Risk No Risk        Prior Inpatient Therapy:   Prior Outpatient Therapy:    Alcohol Screening: 1. How often do you have a drink containing alcohol?: 2 to 3 times a week 2. How many drinks containing alcohol do you have on a typical day when you are drinking?: 1 or 2 3. How often do you have six or more drinks on one occasion?: Never AUDIT-C Score: 3 4. How often during the last year have you found that you were not able to stop drinking once you had started?: Never 5. How often during the last year have you failed to do what was normally expected from you because of drinking?: Never 6. How often during the last year have you needed a first  drink in the morning to get yourself going after a heavy drinking session?: Never 7. How often during the last year have you had a feeling of guilt of remorse after drinking?: Never 8. How often during the last year have you been unable to remember what happened the night before because you had been drinking?: Never 9. Have you or someone else been injured as a result of your drinking?: No 10. Has a relative or friend or a doctor or another health worker been concerned about your drinking or suggested you cut down?: No Alcohol Use Disorder Identification Test Final Score (AUDIT): 3 Alcohol Brief Interventions/Follow-up: Alcohol education/Brief advice Substance Abuse History in the last 12 months:  Yes.   Consequences of Substance Abuse: Medical Consequences:  Self injurious behaviors Previous Psychotropic Medications: Yes  Psychological Evaluations: No  Past Medical History:  Past Medical History:  Diagnosis Date   Bipolar 1 disorder ([place])    History reviewed. No pertinent surgical history. Family History: History reviewed. No pertinent family history. Family Psychiatric  History: See above Tobacco  Screening:  1 pack per day Social History: See above Social History   Substance and Sexual Activity  Alcohol Use Yes   Alcohol/week: 2.0 standard drinks of alcohol   Types: 2 Cans of beer per week     Social History   Substance and Sexual Activity  Drug Use Yes   Types: Marijuana    Additional Social History: Marital status: Long term relationship Long term relationship, how long?: Girlfriend 2 years What types of issues is patient dealing with in the relationship?: Argue sometimes Are you sexually active?: Yes What is your sexual orientation?: heterosexual Does patient have children?: Yes How many children?: 1 How is patient's relationship with their children?: Pt has a 23 year old son.  Has not seen him in two months.   Allergies:  No Known Allergies Lab Results:  Results for orders placed or performed during the hospital encounter of 01/19/22 (from the past 48 hour(s))  Hemoglobin A1c     Status: None   Collection Time: 01/20/22  6:37 AM  Result Value Ref Range   Hgb A1c MFr Bld 5.1 4.8 - 5.6 %    Comment: (NOTE) Pre diabetes:          5.7%-6.4%  Diabetes:              >6.4%  Glycemic control for   <7.0% adults with diabetes    Mean Plasma Glucose 99.67 mg/dL    Comment: Performed at [facility] [address], [place], [place] [address]  Lipid panel     Status: Abnormal   Collection Time: 01/20/22  6:37 AM  Result Value Ref Range   Cholesterol 196 0 - 200 mg/dL   Triglycerides 140 <150 mg/dL   HDL 37 (L) >40 mg/dL   Total CHOL/HDL Ratio 5.3 RATIO   VLDL 28 0 - 40 mg/dL   LDL Cholesterol 131 (H) 0 - 99 mg/dL    Comment:        Total Cholesterol/HDL:CHD Risk Coronary Heart Disease Risk Table                     Men   Women  1/2 Average Risk   3.4   3.3  Average Risk       5.0   4.4  2 X Average Risk   9.6   7.1  3 X Average Risk  23.4   11.0  Use the calculated Patient Ratio above and the CHD Risk Table to determine the patient's CHD Risk.         ATP III CLASSIFICATION (LDL):  <100     mg/dL   Optimal  350-093  mg/dL   Near or Above                    Optimal  130-159  mg/dL   Borderline  818-299  mg/dL   High  >371     mg/dL   Very High Performed at [facility], [address] [place], [place] [address]   TSH     Status: None   Collection Time: 01/20/22  6:37 AM  Result Value Ref Range   TSH 0.876 0.350 - 4.500 uIU/mL    Comment: Performed by a 3rd Generation assay with a functional sensitivity of <=0.01 uIU/mL. Performed at [facility], [address] [place], [place] [address]     Blood Alcohol level:  Lab Results  Component Value Date   ETH <10 01/19/2022   ETH <10 05/20/2017    Metabolic Disorder Labs:  Lab Results  Component Value Date   HGBA1C 5.1 01/20/2022   MPG 99.67 01/20/2022   No results found for: "PROLACTIN" Lab Results  Component Value Date   CHOL 196 01/20/2022   TRIG 140 01/20/2022   HDL 37 (L) 01/20/2022   CHOLHDL 5.3 01/20/2022   VLDL 28 01/20/2022   LDLCALC 131 (H) 01/20/2022   Current Medications: Current Facility-Administered Medications  Medication Dose Route Frequency Provider Last Rate Last Admin   acetaminophen (TYLENOL) tablet 650 mg  650 mg Oral Q6H PRN Jackelyn Poling, NP   650 mg at 01/19/22 1838   alum & mag hydroxide-simeth (MAALOX/MYLANTA) 200-200-20 MG/5ML suspension 30 mL  30 mL Oral Q4H PRN Jackelyn Poling, NP       hydrOXYzine (ATARAX) tablet 25 mg  25 mg Oral TID PRN Jackelyn Poling, NP   25 mg at 01/19/22 2115   influenza vac split quadrivalent PF (FLUARIX) injection 0.5 mL  0.5 mL Intramuscular Tomorrow-1000 Massengill, Harrold Donath, MD       loperamide (IMODIUM) capsule 2-4 mg  2-4 mg Oral PRN Starleen Blue, NP       LORazepam (ATIVAN) tablet 1 mg  1 mg Oral Q6H PRN Starleen Blue, NP       magnesium hydroxide (MILK OF MAGNESIA) suspension 30 mL  30 mL Oral Daily PRN Jackelyn Poling, NP       multivitamin with minerals tablet 1  tablet  1 tablet Oral Daily Gilberto Streck, NP       neomycin-polymyxin-pramoxine (NEOSPORIN PLUS) cream   Topical BID Sarita Bottom, MD   Given at 01/20/22 1017   nicotine polacrilex (NICORETTE) gum 2 mg  2 mg Oral PRN Massengill, Harrold Donath, MD       ondansetron (ZOFRAN-ODT) disintegrating tablet 4 mg  4 mg Oral Q6H PRN Starleen Blue, NP       pneumococcal 23 valent vaccine (PNEUMOVAX-23) injection 0.5 mL  0.5 mL Intramuscular Tomorrow-1000 Massengill, Harrold Donath, MD       Melene Muller ON 01/21/2022] sertraline (ZOLOFT) tablet 100 mg  100 mg Oral Daily Starleen Blue, NP       [START ON 01/21/2022] thiamine (Vitamin B-1) tablet 100 mg  100 mg Oral Daily Raynard Mapps, NP       thiamine (VITAMIN B1) injection 100 mg  100 mg Intramuscular Once Starleen Blue, NP  traZODone (DESYREL) tablet 50 mg  50 mg Oral QHS PRN Rozetta Nunnery, NP   50 mg at 01/19/22 2115   PTA Medications: Medications Prior to Admission  Medication Sig Dispense Refill Last Dose   ibuprofen (ADVIL) 800 MG tablet Take 1 tablet (800 mg total) by mouth every 8 (eight) hours as needed (pain). 21 tablet 0    Musculoskeletal: Strength & Muscle Tone: within normal limits Gait & Station: normal Patient leans: N/A  Psychiatric Specialty Exam:  Presentation  General Appearance: Appropriate for Environment; Fairly Groomed  Eye Contact:Fair  Speech:Clear and Coherent  Speech Volume:Normal  Handedness:Right  Mood and Affect  Mood:Depressed  Affect:Congruent  Thought Process  Thought Processes:Coherent  Duration of Psychotic Symptoms: No data recorded Past Diagnosis of Schizophrenia or Psychoactive disorder: No  Descriptions of Associations:Intact  Orientation:Full (Time, Place and Person)  Thought Content:Logical  Hallucinations:Hallucinations: None  Ideas of Reference:None  Suicidal Thoughts:Suicidal Thoughts: No  Homicidal Thoughts:Homicidal Thoughts: No  Sensorium  Memory:Immediate  Good  Judgment:Poor  Insight:Poor   Executive Functions  Concentration:Fair  Attention Span:Fair  [place]  Psychomotor Activity  Psychomotor Activity:Psychomotor Activity: Normal  Assets  Assets:Communication Skills  Sleep  Sleep:Sleep: Poor  Physical Exam: Physical Exam Constitutional:      Appearance: Normal appearance.  HENT:     Head: Normocephalic.  Eyes:     Pupils: Pupils are equal, round, and reactive to light.  Pulmonary:     Effort: Pulmonary effort is normal.  Musculoskeletal:        General: Normal range of motion.  Neurological:     Mental Status: He is oriented to person, place, and time.    Review of Systems  Constitutional:  Negative for fever.  HENT:  Negative for hearing loss.   Eyes: Negative.   Respiratory:  Negative for cough.   Cardiovascular:  Negative for chest pain.  Gastrointestinal: Negative.  Negative for heartburn.  Genitourinary: Negative.   Musculoskeletal: Negative.   Skin:  Negative for rash.  Neurological: Negative.   Psychiatric/Behavioral:  Positive for depression, substance abuse and suicidal ideas. Negative for hallucinations and memory loss. The patient is nervous/anxious and has insomnia.    Blood pressure 137/76, pulse (!) 58, temperature 98.5 F (36.9 C), temperature source Oral, resp. rate 16, height 5\' 8"  (1.727 m), weight 72.6 kg, SpO2 97 %. Body mass index is 24.33 kg/m.  Treatment Plan Summary: Daily contact with patient to assess and evaluate symptoms and progress in treatment and Medication management  Observation Level/Precautions:  15 minute checks  Laboratory:  Labs reviewed   Psychotherapy:  Unit Group sessions  Medications:  See [facility]  Consultations:  To be determined   Discharge Concerns:  Safety, medication compliance, mood stability  Estimated LOS: 5-7 days  Other:  N/A   PLAN Safety and Monitoring: Voluntary admission to inpatient psychiatric  unit for safety, stabilization and treatment Daily contact with patient to assess and evaluate symptoms and progress in treatment Patient's case to be discussed in multi-disciplinary team meeting Observation Level : q15 minute checks Vital signs: q12 hours Precautions: Suicide  Long Term Goal(s): Improvement in symptoms so as ready for discharge  Short Term Goals: Ability to identify changes in lifestyle to reduce recurrence of condition will improve, Ability to verbalize feelings will improve, Ability to disclose and discuss suicidal ideas, Ability to demonstrate self-control will improve, Ability to identify and develop effective coping behaviors will improve, Compliance with prescribed medications will improve, and Ability  to identify triggers associated with substance abuse/mental health issues will improve  Diagnoses  Principal Problem:   MDD (major depressive disorder), recurrent severe, without psychosis ([place]) Active Problems:   Alcohol use disorder   Delta-9-tetrahydrocannabinol (THC) dependence ([facility])   Cocaine use disorder ([facility])  Severe recurrent major depressive disorder without psychotic features ([place]) -Start Zoloft 50 mg daily and increase to 100 mg on 01/21/22-Pt educated on benefits, rationales and possible side effects of this medication and is agreeable to trials.  Anxiety -Continue Hydroxyzine 25 mg every 6 hours PRN  Insomnia -Start Trazodone 50 mg nightly PRN for sleep  Nicotine addiction -Start Nicorette gum PRN  Alcohol use d/o Monitor for signs of withdrawal -Continue Ativan 1mg  tabs every 6 hours PRN for CIWA >10  -Oral thiamine and MVI replacement -Abstinence from substances encouraged -SW to look into options for outpatient SA treatment at discharge   Other PRNS -Continue Tylenol 650 mg every 6 hours PRN for mild pain -Continue Maalox 30 mg every 4 hrs PRN for indigestion -Continue Imodium 2-4 mg as needed for diarrhea -Continue Milk of Magnesia as  needed every 6 hrs for constipation -Continue Zofran disintegrating tabs every 6 hrs PRN for nausea   Discharge Planning: Social work and case management to assist with discharge planning and identification of hospital follow-up needs prior to discharge Estimated LOS: 5-7 days Discharge Concerns: Need to establish a safety plan; Medication compliance and effectiveness Discharge Goals: Return home with outpatient referrals for mental health follow-up including medication management/psychotherapy  I certify that inpatient services furnished can reasonably be expected to improve the patient's condition.    Nicholes Rough, NP 10/21/20235:50 PM

## 2022-01-20 NOTE — [facility] Suicide Risk Assessment (Signed)
Suicide Risk Assessment  Admission Assessment    [facility] Admission Suicide Risk Assessment   Nursing information obtained from:  Patient Demographic factors:  Male Current Mental Status:  Suicidal ideation indicated by patient Loss Factors:  Financial problems / change in socioeconomic status Historical Factors:  Prior suicide attempts Risk Reduction Factors:  Employed  Total Time spent with patient: 30 minutes Principal Problem: MDD (major depressive disorder), recurrent severe, without psychosis (HCC) Diagnosis:  Principal Problem:   MDD (major depressive disorder), recurrent severe, without psychosis (HCC) Active Problems:   Alcohol use disorder   Delta-9-tetrahydrocannabinol (THC) dependence (HCC)   Cocaine use disorder (HCC)  History of Present Illness: Jonathan [name] is a 26 year old African-American male with a prior mental health history of MDD who was taken to the [facility] ED by EMS after a suicide attempt in which he made cuts to his left wrist, and called 911 in the context of socioeconomic stressors & polysubstance abuse.  Patient was involuntarily transferred to this [facility] for treatment and stabilization of his mood.     Continued Clinical Symptoms: Patient reports worsening depressive symptoms over the course of a year now; patient reports that in this time frame, he has had gradually worsening of his ability to concentrate, decreased energy levels, insomnia, decreased appetite, feelings of irritability, anhedonia, frustration, anger, as well as feelings of helplessness and hopelessness and worthlessness. Pt currently reports passive SI, and current symptoms render pt at a high risk of danger to self. Continuous hospitalization required to treat and stabilize symptoms.  Alcohol Use Disorder Identification Test Final Score (AUDIT): 3 The "Alcohol Use Disorders Identification Test", Guidelines for Use in Primary Care, Second Edition.  World Science writer [facility]). Score  between 0-7:  no or low risk or alcohol related problems. Score between 8-15:  moderate risk of alcohol related problems. Score between 16-19:  high risk of alcohol related problems. Score 20 or above:  warrants further diagnostic evaluation for alcohol dependence and treatment.  CLINICAL FACTORS:   Depression:   Anhedonia Hopelessness Impulsivity Insomnia Recent sense of peace/wellbeing Severe  Musculoskeletal: Strength & Muscle Tone: within normal limits Gait & Station: normal Patient leans: N/A  Psychiatric Specialty Exam:  Presentation  General Appearance:  Appropriate for Environment; Fairly Groomed  Eye Contact: Fair  Speech: Clear and Coherent  Speech Volume: Normal  Handedness: Right   Mood and Affect  Mood: Depressed  Affect: Congruent  Thought Process  Thought Processes: Coherent  Descriptions of Associations:Intact  Orientation:Full (Time, Place and Person)  Thought Content:Logical  History of Schizophrenia/Schizoaffective disorder:No  Duration of Psychotic Symptoms:No data recorded Hallucinations:Hallucinations: None  Ideas of Reference:None  Suicidal Thoughts:Suicidal Thoughts: No  Homicidal Thoughts:Homicidal Thoughts: No  Sensorium  Memory: Immediate Good  Judgment: Poor  Insight: Poor  Executive Functions  Concentration: Fair  Attention Span: Fair  Recall: Fair  Fund of Knowledge: Fair  Language: Fair  Psychomotor Activity  Psychomotor Activity: Psychomotor Activity: Normal  Assets  Assets: Communication Skills Sleep  Sleep: Sleep: Poor  Physical Exam: Physical Exam Constitutional:      Appearance: Normal appearance.  HENT:     Head: Normocephalic.  Eyes:     Pupils: Pupils are equal, round, and reactive to light.  Pulmonary:     Effort: Pulmonary effort is normal. No respiratory distress.  Musculoskeletal:        General: Normal range of motion.     Cervical back: Normal range of motion.   Neurological:     Mental Status:  He is alert and oriented to person, place, and time.     Sensory: No sensory deficit.    Review of Systems  Constitutional:  Negative for fever.  HENT:  Negative for hearing loss.   Respiratory:  Negative for cough.   Cardiovascular:  Negative for chest pain.  Gastrointestinal:  Negative for heartburn and nausea.  Genitourinary:  Negative for dysuria.  Musculoskeletal:  Negative for myalgias.  Skin:  Negative for rash.  Neurological:  Negative for dizziness.  Psychiatric/Behavioral:  Positive for depression, substance abuse and suicidal ideas. Negative for hallucinations and memory loss. The patient is nervous/anxious and has insomnia.    Blood pressure 137/76, pulse (!) 58, temperature 98.5 F (36.9 C), temperature source Oral, resp. rate 16, height 5\' 8"  (1.727 m), weight 72.6 kg, SpO2 97 %. Body mass index is 24.33 kg/m.   COGNITIVE FEATURES THAT CONTRIBUTE TO RISK:  None    SUICIDE RISK:   Moderate:  Frequent suicidal ideation with limited intensity, and duration, some specificity in terms of plans, no associated intent, good self-control, limited dysphoria/symptomatology, some risk factors present, and identifiable protective factors, including available and accessible social support. Treatment Plan Summary: Daily contact with patient to assess and evaluate symptoms and progress in treatment and Medication management   Observation Level/Precautions:  15 minute checks  Laboratory:  Labs reviewed   Psychotherapy:  Unit Group sessions  Medications:  See [facility]  Consultations:  To be determined   Discharge Concerns:  Safety, medication compliance, mood stability  Estimated LOS: 5-7 days  Other:  N/A    PLAN Safety and Monitoring: Voluntary admission to inpatient psychiatric unit for safety, stabilization and treatment Daily contact with patient to assess and evaluate symptoms and progress in treatment Patient's case to be discussed in  multi-disciplinary team meeting Observation Level : q15 minute checks Vital signs: q12 hours Precautions: Suicide   Long Term Goal(s): Improvement in symptoms so as ready for discharge   Short Term Goals: Ability to identify changes in lifestyle to reduce recurrence of condition will improve, Ability to verbalize feelings will improve, Ability to disclose and discuss suicidal ideas, Ability to demonstrate self-control will improve, Ability to identify and develop effective coping behaviors will improve, Compliance with prescribed medications will improve, and Ability to identify triggers associated with substance abuse/mental health issues will improve   Diagnoses  Principal Problem:   MDD (major depressive disorder), recurrent severe, without psychosis (HCC) Active Problems:   Alcohol use disorder   Delta-9-tetrahydrocannabinol (THC) dependence (HCC)   Cocaine use disorder (HCC)   Severe recurrent major depressive disorder without psychotic features (HCC) -Start Zoloft 50 mg daily and increase to 100 mg on 01/21/22-Pt educated on benefits, rationales and possible side effects of this medication and is agreeable to trials.   Anxiety -Continue Hydroxyzine 25 mg every 6 hours PRN   Insomnia -Start Trazodone 50 mg nightly PRN for sleep   Nicotine addiction -Start Nicorette gum PRN   Alcohol use d/o Monitor for signs of withdrawal -Continue Ativan 1mg  tabs every 6 hours PRN for CIWA >10  -Oral thiamine and MVI replacement -Abstinence from substances encouraged -SW to look into options for outpatient SA treatment at discharge    Other PRNS -Continue Tylenol 650 mg every 6 hours PRN for mild pain -Continue Maalox 30 mg every 4 hrs PRN for indigestion -Continue Imodium 2-4 mg as needed for diarrhea -Continue Milk of Magnesia as needed every 6 hrs for constipation -Continue Zofran disintegrating tabs every 6 hrs PRN  for nausea    Discharge Planning: Social work and case management  to assist with discharge planning and identification of hospital follow-up needs prior to discharge Estimated LOS: 5-7 days Discharge Concerns: Need to establish a safety plan; Medication compliance and effectiveness Discharge Goals: Return home with outpatient referrals for mental health follow-up including medication management/psychotherapy   I certify that inpatient services furnished can reasonably be expected to improve the patient's condition.   Nicholes Rough, NP 01/20/2022, 5:59 PM

## 2022-01-20 NOTE — [facility] Group Notes (Signed)
Psychoeducational Group- Patients were given activity in which they were asked to reflect on negative coping skills and barriers they had to developing positive ones. Patients were educated on the impact of vulnerability and how that can impact mental health.  Pt attended group and was appropriate.  

## 2022-01-21 DIAGNOSIS — F332 Major depressive disorder, recurrent severe without psychotic features: Secondary | ICD-10-CM | POA: Diagnosis not present

## 2022-01-21 LAB — VITAMIN D 25 HYDROXY (VIT D DEFICIENCY, FRACTURES): Vit D, 25-Hydroxy: 13 ng/mL — ABNORMAL LOW (ref 30–100)

## 2022-01-21 LAB — VITAMIN B12: Vitamin B-12: 256 pg/mL (ref 180–914)

## 2022-01-21 NOTE — Progress Notes (Signed)
[facility] MD Progress Note  01/21/2022 2:11 PM Jonathan [name]  MRN:  [id number] Principal Problem: MDD (major depressive disorder), recurrent severe, without psychosis ([place]) Diagnosis: Principal Problem:   MDD (major depressive disorder), recurrent severe, without psychosis ([place]) Active Problems:   Alcohol use disorder   Delta-9-tetrahydrocannabinol (THC) dependence ([place])   Cocaine use disorder ([place])  Reason for admission: Jonathan [name] is a 26 year old African-American male with a prior mental health history of MDD who was taken to the [facility] by EMS after a suicide attempt in which he made cuts to his left wrist, and called 911 in the context of socioeconomic stressors & polysubstance abuse.  Patient was involuntarily transferred to this [facility] for treatment and stabilization of his mood.    24 hour chart review: Vital signs with blood pressure elevated earlier today morning at 149/89, but within normal limits when rechecked today afternoon.  Patient is compliant with all of his scheduled medications.  He received trazodone 50 mg last night for sleep and hydroxyzine 25 mg last night for anxiety.  Patient is attending unit group sessions and is participating.  He slept for a total of 7 hours last night as per nursing documentation.   Patient assessment note (01/21/22): Pt with flat affect and depressed mood, attention to personal hygiene and grooming is fair, eye contact is good, speech is clear & coherent. Thought contents are organized and logical, and pt currently denies SI/HI/AVH or paranoia. There is no evidence of delusional thoughts.    Writer assisted pt's assigned RN to clean wound and change dressing to L forearm. Old dressings taken off. Outer forearm area from wrist area to near elbow with multiple cuts through the dermis, slightly reaching the epidermis. Epidermal tissue beefy red, seems to be healing, beginning to scab over, minimal drainage noted to area. Area cleansed with  normal saline solution, antibiotic ointment applied, non adherent dressing applied followed by ABD dressing and secured with cloth tape. Pt with +sensation to area, denies pain to site, tolerated dressing change well.  Patient reports an improvement in his mood since hospitalization.  He reports a good appetite, and reports reports a good sleep quality last night.  He rates his depressive symptoms today as 5 (10 being the worst).  He rates anxiety as 1 (10 being worst).  He denies being in any physical pain, and reports last BM as being yesterday.  He denies any medication related side effects, and is tolerating being on Zoloft for management of his depressive symptoms.  Patient started Zoloft 100 mg earlier today morning and we will continue this medication, along with other medications as listed below.  We will continue to follow.  Total Time spent with patient: 30 minutes  Past Psychiatric History: MDD, polysubstance abuse  Past Medical History:  Past Medical History:  Diagnosis Date   Bipolar 1 disorder ([place])    History reviewed. No pertinent surgical history. Family History: History reviewed. No pertinent family history. Family Psychiatric  History: Polysubstance abuse in mother Social History:  Social History   Substance and Sexual Activity  Alcohol Use Yes   Alcohol/week: 2.0 standard drinks of alcohol   Types: 2 Cans of beer per week     Social History   Substance and Sexual Activity  Drug Use Yes   Types: Marijuana    Social History   Socioeconomic History   Marital status: Single    Spouse name: Not on file   Number of children: Not on file  Years of education: Not on file   Highest education level: Not on file  Occupational History   Not on file  Tobacco Use   Smoking status: Every Day    Packs/day: 1.00    Types: Cigarettes   Smokeless tobacco: Never  Substance and Sexual Activity   Alcohol use: Yes    Alcohol/week: 2.0 standard drinks of alcohol    Types:  2 Cans of beer per week   Drug use: Yes    Types: Marijuana   Sexual activity: Yes    Birth control/protection: Condom  Other Topics Concern   Not on file  Social History Narrative   ** Merged History Encounter **       Social Determinants of Health   Financial Resource Strain: Not on file  Food Insecurity: No Food Insecurity (01/19/2022)   Hunger Vital Sign    Worried About Running Out of Food in the Last Year: Never true    Ran Out of Food in the Last Year: Never true  Transportation Needs: No Transportation Needs (01/19/2022)   PRAPARE - Administrator, Civil ServiceTransportation    Lack of Transportation (Medical): No    Lack of Transportation (Non-Medical): No  Physical Activity: Not on file  Stress: Not on file  Social Connections: Not on file   Additional Social History:   Sleep: Good  Appetite:  Good  Current Medications: Current Facility-Administered Medications  Medication Dose Route Frequency Provider Last Rate Last Admin   acetaminophen (TYLENOL) tablet 650 mg  650 mg Oral Q6H PRN Jackelyn PolingBerry, Jason A, NP   650 mg at 01/19/22 1838   alum & mag hydroxide-simeth (MAALOX/MYLANTA) 200-200-20 MG/5ML suspension 30 mL  30 mL Oral Q4H PRN Jackelyn PolingBerry, Jason A, NP       hydrOXYzine (ATARAX) tablet 25 mg  25 mg Oral TID PRN Jackelyn PolingBerry, Jason A, NP   25 mg at 01/20/22 2105   loperamide (IMODIUM) capsule 2-4 mg  2-4 mg Oral PRN Starleen BlueNkwenti, Hiram Mciver, NP       LORazepam (ATIVAN) tablet 1 mg  1 mg Oral Q6H PRN Starleen BlueNkwenti, Emmalena Canny, NP       magnesium hydroxide (MILK OF MAGNESIA) suspension 30 mL  30 mL Oral Daily PRN Jackelyn PolingBerry, Jason A, NP       multivitamin with minerals tablet 1 tablet  1 tablet Oral Daily Teresa Nicodemus, NP   1 tablet at 01/21/22 0900   neomycin-polymyxin-pramoxine (NEOSPORIN PLUS) cream   Topical BID Sarita BottomAttiah, Nadir, MD   Given at 01/20/22 2105   nicotine polacrilex (NICORETTE) gum 2 mg  2 mg Oral PRN Massengill, Harrold DonathNathan, MD       ondansetron (ZOFRAN-ODT) disintegrating tablet 4 mg  4 mg Oral Q6H PRN Starleen BlueNkwenti, Chazlyn Cude, NP        sertraline (ZOLOFT) tablet 100 mg  100 mg Oral Daily Gissella Niblack, NP   100 mg at 01/21/22 0900   thiamine (Vitamin B-1) tablet 100 mg  100 mg Oral Daily Kerianne Gurr, NP   100 mg at 01/21/22 0900   thiamine (VITAMIN B1) injection 100 mg  100 mg Intramuscular Once Starleen BlueNkwenti, Javious Hallisey, NP       traZODone (DESYREL) tablet 50 mg  50 mg Oral QHS PRN Jackelyn PolingBerry, Jason A, NP   50 mg at 01/20/22 2105    Lab Results:  Results for orders placed or performed during the hospital encounter of 01/19/22 (from the past 48 hour(s))  Hemoglobin A1c     Status: None   Collection Time: 01/20/22  6:37 AM  Result Value Ref Range   Hgb A1c MFr Bld 5.1 4.8 - 5.6 %    Comment: (NOTE) Pre diabetes:          5.7%-6.4%  Diabetes:              >6.4%  Glycemic control for   <7.0% adults with diabetes    Mean Plasma Glucose 99.67 mg/dL    Comment: Performed at [facility], [address], [place], [place] [address]  Lipid panel     Status: Abnormal   Collection Time: 01/20/22  6:37 AM  Result Value Ref Range   Cholesterol 196 0 - 200 mg/dL   Triglycerides 809 <983 mg/dL   HDL 37 (L) >38 mg/dL   Total CHOL/HDL Ratio 5.3 RATIO   VLDL 28 0 - 40 mg/dL   LDL Cholesterol 250 (H) 0 - 99 mg/dL    Comment:        Total Cholesterol/HDL:CHD Risk Coronary Heart Disease Risk Table                     Men   Women  1/2 Average Risk   3.4   3.3  Average Risk       5.0   4.4  2 X Average Risk   9.6   7.1  3 X Average Risk  23.4   11.0        Use the calculated Patient Ratio above and the CHD Risk Table to determine the patient's CHD Risk.        ATP III CLASSIFICATION (LDL):  <100     mg/dL   Optimal  539-767  mg/dL   Near or Above                    Optimal  130-159  mg/dL   Borderline  341-937  mg/dL   High  >902     mg/dL   Very High Performed at [facility], [address] [place], [place] [address]   TSH     Status: None   Collection Time: 01/20/22  6:37 AM  Result Value Ref  Range   TSH 0.876 0.350 - 4.500 uIU/mL    Comment: Performed by a 3rd Generation assay with a functional sensitivity of <=0.01 uIU/mL. Performed at [facility], [address] [place], [place] [address]   Urinalysis, Routine w reflex microscopic Urine, Clean Catch     Status: None   Collection Time: 01/20/22  5:49 PM  Result Value Ref Range   Color, Urine YELLOW YELLOW   APPearance CLEAR CLEAR   Specific Gravity, Urine 1.010 1.005 - 1.030   pH 6.5 5.0 - 8.0   Glucose, UA NEGATIVE NEGATIVE mg/dL   Hgb urine dipstick NEGATIVE NEGATIVE   Bilirubin Urine NEGATIVE NEGATIVE   Ketones, ur NEGATIVE NEGATIVE mg/dL   Protein, ur NEGATIVE NEGATIVE mg/dL   Nitrite NEGATIVE NEGATIVE   Leukocytes,Ua NEGATIVE NEGATIVE    Comment: Microscopic not done on urines with negative protein, blood, leukocytes, nitrite, or glucose < 500 mg/dL. Performed at [facility], [address], [place], [place] [address]   Vitamin B12     Status: None   Collection Time: 01/21/22  6:46 AM  Result Value Ref Range   Vitamin B-12 256 180 - 914 pg/mL    Comment: (NOTE) This assay is not validated for testing neonatal or myeloproliferative syndrome specimens for Vitamin B12 levels. Performed at [facility], [address] [place], [place] [address]     Blood Alcohol level:  Lab Results  Component Value Date   ETH <10 01/19/2022   ETH <10 05/20/2017    Metabolic Disorder Labs: Lab Results  Component Value Date   HGBA1C 5.1 01/20/2022   MPG 99.67 01/20/2022   No results found for: "PROLACTIN" Lab Results  Component Value Date   CHOL 196 01/20/2022   TRIG 140 01/20/2022   HDL 37 (L) 01/20/2022   CHOLHDL 5.3 01/20/2022   VLDL 28 01/20/2022   LDLCALC 131 (H) 01/20/2022    Physical Findings: AIMS: Facial and Oral Movements Muscles of Facial Expression: None, normal Lips and Perioral Area: None, normal Jaw: None, normal Tongue: None,  normal,Extremity Movements Upper (arms, wrists, hands, fingers): None, normal Lower (legs, knees, ankles, toes): None, normal, Trunk Movements Neck, shoulders, hips: None, normal, Overall Severity Severity of abnormal movements (highest score from questions above): None, normal Incapacitation due to abnormal movements: None, normal Patient's awareness of abnormal movements (rate only patient's report): No Awareness, Dental Status Current problems with teeth and/or dentures?: No Does patient usually wear dentures?: No  CIWA:  CIWA-Ar Total: 2 COWS:     Musculoskeletal: Strength & Muscle Tone: within normal limits Gait & Station: normal Patient leans: N/A  Psychiatric Specialty Exam:  Presentation  General Appearance:  Appropriate for Environment; Fairly Groomed  Eye Contact: Fair  Speech: Clear and Coherent  Speech Volume: Normal  Handedness: Right   Mood and Affect  Mood: Depressed  Affect: Congruent   Thought Process  Thought Processes: Coherent  Descriptions of Associations:Intact  Orientation:Full (Time, Place and Person)  Thought Content:Logical  History of Schizophrenia/Schizoaffective disorder:No  Duration of Psychotic Symptoms:No data recorded Hallucinations:Hallucinations: None  Ideas of Reference:None  Suicidal Thoughts:Suicidal Thoughts: No  Homicidal Thoughts:Homicidal Thoughts: No   Sensorium  Memory: Immediate Good  Judgment: Fair  Insight: Fair   Art therapist  Concentration: Good  Attention Span: Good  Recall: Good  Fund of Knowledge: Good  Language: Good  Psychomotor Activity  Psychomotor Activity: Psychomotor Activity: Normal  Assets  Assets: Communication Skills  Sleep  Sleep: Sleep: Good  Physical Exam: Physical Exam Constitutional:      Appearance: Normal appearance.  HENT:     Head: Normocephalic.     Nose: Nose normal. No congestion or rhinorrhea.  Eyes:     Pupils: Pupils are  equal, round, and reactive to light.  Pulmonary:     Effort: Pulmonary effort is normal.  Musculoskeletal:     Cervical back: Normal range of motion.  Neurological:     General: No focal deficit present.     Mental Status: He is alert and oriented to person, place, and time.     Sensory: No sensory deficit.     Coordination: Coordination normal.  Psychiatric:        Behavior: Behavior normal.    Review of Systems  Constitutional:  Negative for fever.  HENT:  Negative for sore throat.   Respiratory:  Negative for cough.   Cardiovascular:  Negative for chest pain.  Gastrointestinal:  Negative for heartburn.  Skin:  Negative for rash.  Neurological:  Negative for dizziness and headaches.  Psychiatric/Behavioral:  Positive for depression and substance abuse. Negative for hallucinations, memory loss and suicidal ideas. The patient has insomnia. The patient is not nervous/anxious.    Blood pressure 132/85, pulse (!) 55, temperature 98.2 F (36.8 C), temperature source Oral, resp. rate 17, height 5\' 8"  (  1.727 m), weight 72.6 kg, SpO2 100 %. Body mass index is 24.33 kg/m.  Treatment Plan Summary:  Treatment Plan Summary: Daily contact with patient to assess and evaluate symptoms and progress in treatment and Medication management   Observation Level/Precautions:  15 minute checks  Laboratory:  Labs reviewed   Psychotherapy:  Unit Group sessions  Medications:  See [facility]  Consultations:  To be determined   Discharge Concerns:  Safety, medication compliance, mood stability  Estimated LOS: 5-7 days  Other:  N/A    PLAN Safety and Monitoring: Voluntary admission to inpatient psychiatric unit for safety, stabilization and treatment Daily contact with patient to assess and evaluate symptoms and progress in treatment Patient's case to be discussed in multi-disciplinary team meeting Observation Level : q15 minute checks Vital signs: q12 hours Precautions: Suicide   Long Term Goal(s):  Improvement in symptoms so as ready for discharge   Short Term Goals: Ability to identify changes in lifestyle to reduce recurrence of condition will improve, Ability to verbalize feelings will improve, Ability to disclose and discuss suicidal ideas, Ability to demonstrate self-control will improve, Ability to identify and develop effective coping behaviors will improve, Compliance with prescribed medications will improve, and Ability to identify triggers associated with substance abuse/mental health issues will improve   Diagnoses  Principal Problem:   MDD (major depressive disorder), recurrent severe, without psychosis ([facility]) Active Problems:   Alcohol use disorder   Delta-9-tetrahydrocannabinol (THC) dependence ([facility])   Cocaine use disorder ([facility])   Severe recurrent major depressive disorder without psychotic features ([facility]) -Continue Zoloft 100 mg-Pt educated on benefits, rationales and possible side effects of this medication including the blackbox warning of the increased risk of SI when SSRIs are first initiated and is agreeable to trials.   Anxiety -Continue Hydroxyzine 25 mg every 6 hours PRN   Insomnia -Continue Trazodone 50 mg nightly PRN for sleep   Nicotine addiction -Continue Nicorette gum PRN   Alcohol use d/o Monitor for signs of withdrawal -Continue Ativan 1mg  tabs every 6 hours PRN for CIWA >10  -Oral thiamine and MVI replacement -Abstinence from substances encouraged -SW to look into options for outpatient SA treatment at discharge    Other PRNS -Continue Tylenol 650 mg every 6 hours PRN for mild pain -Continue Maalox 30 mg every 4 hrs PRN for indigestion -Continue Imodium 2-4 mg as needed for diarrhea -Continue Milk of Magnesia as needed every 6 hrs for constipation -Continue Zofran disintegrating tabs every 6 hrs PRN for nausea    Discharge Planning: Social work and case management to assist with discharge planning and identification of hospital follow-up needs  prior to discharge Estimated LOS: 5-7 days Discharge Concerns: Need to establish a safety plan; Medication compliance and effectiveness Discharge Goals: Return home with outpatient referrals for mental health follow-up including medication management/psychotherapy , NP 01/21/2022, 2:11 PM

## 2022-01-21 NOTE — Progress Notes (Signed)
   01/21/22 1945  Psych Admission Type (Psych Patients Only)  Admission Status Involuntary  Psychosocial Assessment  Patient Complaints Anxiety;Depression  Eye Contact Fair  Facial Expression Flat  Affect Anxious  Speech Logical/coherent  Interaction Assertive  Motor Activity Slow  Appearance/Hygiene Unremarkable  Behavior Characteristics Cooperative  Mood Depressed  Aggressive Behavior  Effect No apparent injury  Thought Process  Content WDL  Delusions WDL  Perception WDL  Hallucination None reported or observed  Judgment WDL  Confusion None  Danger to Self  Current suicidal ideation? Denies

## 2022-01-21 NOTE — Progress Notes (Signed)
Adult Psychoeducational Group Note  Date:  01/21/2022 Time:  9:27 PM  Group Topic/Focus:  Wrap-Up Group:   The focus of this group is to help patients review their daily goal of treatment and discuss progress on daily workbooks.  Participation Level:  Did Not Attend  Participation Quality:   n/a  Affect:   n/a  Cognitive:   n/a  Insight: None  Engagement in Group:   n/a  Modes of Intervention:   n/a  Additional Comments:   Pt did not attend the Wrap Up group.  Wetzel Bjornstad Jolyssa Oplinger 01/21/2022, 9:27 PM

## 2022-01-21 NOTE — Plan of Care (Signed)
Nurse discussed anxiety, depression and coping skills with patient.  

## 2022-01-21 NOTE — Group Note (Signed)
LCSW Group Therapy Note  01/21/2022      Type of Therapy and Topic:  Group Therapy: Gratitude  Participation Level:  None   Description of Group:   In this group, patients shared and discussed the importance of acknowledging the elements in their lives for which they are grateful and how this can positively impact their mood.  The group discussed how bringing the positive elements of their lives to the forefront of their minds can help with recovery from any illness, physical or mental.  An exercise was done as a group in which a list was made of gratitude items in order to encourage participants to consider other potential positives in their lives.  Therapeutic Goals: Patients will identify one or more item for which they are grateful in each of 6 categories:  people, experiences, things, places, skills, and other. Patients will discuss how it is possible to seek out gratitude in even bad situations. Patients will explore other possible items of gratitude that they could remember.   Summary of Patient Progress:  The patient was late to group, sat and listened but did not contribute.   Therapeutic Modalities:   Solution-Focused Therapy Activity  Berlin Hun Grossman-Orr, LCSW .

## 2022-01-21 NOTE — Progress Notes (Signed)
D:  Patient's self inventory sheet, patient has fair sleep, sleep medication helpful.  Good appetite, low energy level, good concentration.  Rated depression 5, denied hopeless and anxiety.  Denied withdrawals.  Denied SI.   Denied physical problems.  Denied physical pain.  Goal is work on depression.  Plans to find different ways to cope.  No discharge plans. A:  Medications administered per MD orders.  Emotional support and encouragement given patient. R:  Denied SI and HI, contracts for safety.  Denied A/V hallucinations.  Safety maintained with 15 minute checks.   Patient slept 8 hours last night.  Rated depression 5, anxiety 1.  Appetite good.  Patient stated he continues to have tremors, chills, sweats.

## 2022-01-21 NOTE — [facility] Group Notes (Signed)
Psychoeducational group- Patients were given two poems to read:  Jonathan [name] titled '' The Henreitta Leber and the [place] '' and '' There's a hole in my sidewalk '' by Cristopher Peru.  The first poem discussed fight or flight response in comparison to the owl and the chimp. The patients were asked to identify times in their lives in which they behaved like the owl and the chimp and how they could identify triggers, and then healthy coping mechanisms. The patients were then asked to discuss the second poem and how it related to negative behavioral patterns and identifying ways for healthier coping.  Pt did not participate.or attend.Marland Kitchen

## 2022-01-21 NOTE — Progress Notes (Signed)
Pt reports feeling depressed. 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 He said that he is not making enough money to support his living. He has been looking for another job. He also reports trouble staying asleep. Pt was administered his PRN vistaril 25 mg po for anxiety and trazodone 50 mg po at 2105 for sleep. Pt did attend and participate in group earlier tonight. He denies any withdrawal symptoms for his CIWA assessment. He reports tolerating his zoloft well without any side effects. Pt denies SI/HI and AVH. Active listening, reassurance, and support provided. Q 15 min safety checks continue. Pt's safety has been maintained.   01/20/22 2105  Psych Admission Type (Psych Patients Only)  Admission Status Involuntary  Psychosocial Assessment  Patient Complaints Depression;Anxiety;Sadness;Worrying  Eye Contact Avoids;Brief  Facial Expression Flat  Affect Flat;Depressed;Anxious  Speech Logical/coherent  Interaction Assertive  Motor Activity Slow  Appearance/Hygiene Unremarkable  Behavior Characteristics Cooperative;Appropriate to situation;Anxious  Mood Depressed;Anxious  Thought Process  Coherency WDL  Content WDL  Delusions None reported or observed  Perception WDL  Hallucination None reported or observed  Judgment Limited  Confusion None  Danger to Self  Current suicidal ideation? Denies  Danger to Others  Danger to Others None reported or observed

## 2022-01-21 NOTE — [facility] Suicide Risk Assessment (Signed)
[place] INPATIENT:  Family/Significant Other Suicide Prevention Education  Suicide Prevention Education:  Contact Attempts: girlfriend Jonathan [name] [phone], (name of family member/significant other) has been identified by the patient as the family member/significant other with whom the patient will be residing, and identified as the person(s) who will aid the patient in the event of a mental health crisis.  With written consent from the patient, two attempts were made to provide suicide prevention education, prior to and/or following the patient's discharge.  We were unsuccessful in providing suicide prevention education.  A suicide education pamphlet was given to the patient to share with family/significant other.  Date and time of first attempt:  01/21/2022  /  3:49 PM  Date and time of second attempt:   CSW team to follow up   Jonathan [name] 01/21/2022, 3:49 PM

## 2022-01-22 ENCOUNTER — Encounter (HOSPITAL_COMMUNITY): Payer: Self-pay

## 2022-01-22 DIAGNOSIS — F332 Major depressive disorder, recurrent severe without psychotic features: Secondary | ICD-10-CM | POA: Diagnosis not present

## 2022-01-22 MED ORDER — BACITRACIN-NEOMYCIN-POLYMYXIN OINTMENT TUBE
TOPICAL_OINTMENT | Freq: Every day | CUTANEOUS | Status: DC
  Filled 2022-01-22: qty 14.17

## 2022-01-22 MED ORDER — NEOMYCIN-POLYMYXIN-PRAMOXINE 1 % EX CREA
TOPICAL_CREAM | Freq: Every day | CUTANEOUS | Status: DC
  Filled 2022-01-22: qty 28

## 2022-01-22 MED ORDER — SERTRALINE HCL 50 MG PO TABS
150.0000 mg | ORAL_TABLET | Freq: Every day | ORAL | Status: DC
  Administered 2022-01-23 – 2022-01-25 (×3): 150 mg via ORAL
  Filled 2022-01-22 (×4): qty 1

## 2022-01-22 NOTE — BH IP Treatment Plan (Signed)
Interdisciplinary Treatment and Diagnostic Plan Update  01/22/2022 Time of Session: 9:55am Howard Bunte MRN: [id number]  Principal Diagnosis: MDD (major depressive disorder), recurrent severe, without psychosis (HCC)  Secondary Diagnoses: Principal Problem:   MDD (major depressive disorder), recurrent severe, without psychosis (HCC) Active Problems:   Alcohol use disorder   Delta-9-tetrahydrocannabinol (THC) dependence (HCC)   Cocaine use disorder (HCC)   Current Medications:  Current Facility-Administered Medications  Medication Dose Route Frequency Provider Last Rate Last Admin   acetaminophen (TYLENOL) tablet 650 mg  650 mg Oral Q6H PRN Jackelyn Poling, NP   650 mg at 01/19/22 1838   alum & mag hydroxide-simeth (MAALOX/MYLANTA) 200-200-20 MG/5ML suspension 30 mL  30 mL Oral Q4H PRN Jackelyn Poling, NP       hydrOXYzine (ATARAX) tablet 25 mg  25 mg Oral TID PRN Jackelyn Poling, NP   25 mg at 01/21/22 2109   loperamide (IMODIUM) capsule 2-4 mg  2-4 mg Oral PRN Starleen Blue, NP       LORazepam (ATIVAN) tablet 1 mg  1 mg Oral Q6H PRN Starleen Blue, NP       magnesium hydroxide (MILK OF MAGNESIA) suspension 30 mL  30 mL Oral Daily PRN Jackelyn Poling, NP       multivitamin with minerals tablet 1 tablet  1 tablet Oral Daily Starleen Blue, NP   1 tablet at 01/22/22 6295   neomycin-polymyxin-pramoxine (NEOSPORIN PLUS) cream   Topical BID Sarita Bottom, MD   Given at 01/22/22 0825   nicotine polacrilex (NICORETTE) gum 2 mg  2 mg Oral PRN Massengill, Harrold Donath, MD       ondansetron (ZOFRAN-ODT) disintegrating tablet 4 mg  4 mg Oral Q6H PRN Starleen Blue, NP       sertraline (ZOLOFT) tablet 100 mg  100 mg Oral Daily Starleen Blue, NP   100 mg at 01/22/22 2841   thiamine (Vitamin B-1) tablet 100 mg  100 mg Oral Daily Starleen Blue, NP   100 mg at 01/22/22 3244   thiamine (VITAMIN B1) injection 100 mg  100 mg Intramuscular Once Starleen Blue, NP       traZODone (DESYREL) tablet 50 mg  50 mg  Oral QHS PRN Jackelyn Poling, NP   50 mg at 01/21/22 2108   PTA Medications: Medications Prior to Admission  Medication Sig Dispense Refill Last Dose   ibuprofen (ADVIL) 800 MG tablet Take 1 tablet (800 mg total) by mouth every 8 (eight) hours as needed (pain). 21 tablet 0     Patient Stressors: Financial difficulties   Marital or family conflict    Patient Strengths: Capable of independent living  Physical Health  Supportive family/friends   Treatment Modalities: Medication Management, Group therapy, Case management,  1 to 1 session with clinician, Psychoeducation, Recreational therapy.   Physician Treatment Plan for Primary Diagnosis: MDD (major depressive disorder), recurrent severe, without psychosis (HCC) Long Term Goal(s): Improvement in symptoms so as ready for discharge   Short Term Goals: Ability to identify changes in lifestyle to reduce recurrence of condition will improve Ability to verbalize feelings will improve Ability to disclose and discuss suicidal ideas Ability to demonstrate self-control will improve Ability to identify and develop effective coping behaviors will improve Compliance with prescribed medications will improve Ability to identify triggers associated with substance abuse/mental health issues will improve  Medication Management: Evaluate patient's response, side effects, and tolerance of medication regimen.  Therapeutic Interventions: 1 to 1 sessions, Unit Group sessions and Medication administration.  Evaluation of Outcomes: Progressing  Physician Treatment Plan for Secondary Diagnosis: Principal Problem:   MDD (major depressive disorder), recurrent severe, without psychosis ([place]) Active Problems:   Alcohol use disorder   Delta-9-tetrahydrocannabinol (THC) dependence ([place])   Cocaine use disorder ([place])  Long Term Goal(s): Improvement in symptoms so as ready for discharge   Short Term Goals: Ability to identify changes in lifestyle to reduce  recurrence of condition will improve Ability to verbalize feelings will improve Ability to disclose and discuss suicidal ideas Ability to demonstrate self-control will improve Ability to identify and develop effective coping behaviors will improve Compliance with prescribed medications will improve Ability to identify triggers associated with substance abuse/mental health issues will improve     Medication Management: Evaluate patient's response, side effects, and tolerance of medication regimen.  Therapeutic Interventions: 1 to 1 sessions, Unit Group sessions and Medication administration.  Evaluation of Outcomes: Progressing   RN Treatment Plan for Primary Diagnosis: MDD (major depressive disorder), recurrent severe, without psychosis ([place]) Long Term Goal(s): Knowledge of disease and therapeutic regimen to maintain health will improve  Short Term Goals: Ability to remain free from injury will improve, Ability to verbalize frustration and anger appropriately will improve, Ability to demonstrate self-control, Ability to participate in decision making will improve, Ability to verbalize feelings will improve, Ability to disclose and discuss suicidal ideas, Ability to identify and develop effective coping behaviors will improve, and Compliance with prescribed medications will improve  Medication Management: RN will administer medications as ordered by provider, will assess and evaluate patient's response and provide education to patient for prescribed medication. RN will report any adverse and/or side effects to prescribing provider.  Therapeutic Interventions: 1 on 1 counseling sessions, Psychoeducation, Medication administration, Evaluate responses to treatment, Monitor vital signs and CBGs as ordered, Perform/monitor CIWA, COWS, AIMS and Fall Risk screenings as ordered, Perform wound care treatments as ordered.  Evaluation of Outcomes: Progressing   LCSW Treatment Plan for Primary  Diagnosis: MDD (major depressive disorder), recurrent severe, without psychosis ([place]) Long Term Goal(s): Safe transition to appropriate next level of care at discharge, Engage patient in therapeutic group addressing interpersonal concerns.  Short Term Goals: Engage patient in aftercare planning with referrals and resources, Increase social support, Increase ability to appropriately verbalize feelings, Increase emotional regulation, Facilitate acceptance of mental health diagnosis and concerns, Facilitate patient progression through stages of change regarding substance use diagnoses and concerns, Identify triggers associated with mental health/substance abuse issues, and Increase skills for wellness and recovery  Therapeutic Interventions: Assess for all discharge needs, 1 to 1 time with Social worker, Explore available resources and support systems, Assess for adequacy in community support network, Educate family and significant other(s) on suicide prevention, Complete Psychosocial Assessment, Interpersonal group therapy.  Evaluation of Outcomes: Progressing   Progress in Treatment: Attending groups: No. Participating in groups: No. Taking medication as prescribed: Yes. Toleration medication: Yes. Family/Significant other contact made: Yes, individual(s) contacted:  mother Valente David [phone] Patient understands diagnosis: Yes. Discussing patient identified problems/goals with staff: Yes. Medical problems stabilized or resolved: Yes. Denies suicidal/homicidal ideation: Yes. Issues/concerns per patient self-inventory: No.   New problem(s) identified: No, Describe:  none reported   New Short Term/Long Term Goal(s):  medication stabilization, elimination of SI thoughts, development of comprehensive mental wellness plan.    Patient Goals:  Pt states, " I would like to work on coping skills for my depression"  Discharge Plan or Barriers: Patient recently admitted. CSW will continue  to follow and assess for  appropriate referrals and possible discharge planning.    Reason for Continuation of Hospitalization: Anxiety Depression Medication stabilization Suicidal ideation  Estimated Length of Stay: 5-10 days  Last 3 [place] Suicide Severity Risk Score: Flowsheet Row [facility] Most recent reading at 01/19/2022  5:45 PM ED from 01/19/2022 in [facility] Most recent reading at 01/19/2022  5:44 AM ED from 01/15/2022 in [facility] Most recent reading at 01/15/2022 11:23 AM  C-SSRS RISK CATEGORY Low Risk High Risk No Risk       Last PHQ 2/9 Scores:     No data to display          Scribe for Treatment Team: Beatris Si, LCSW 01/22/2022 2:30 PM

## 2022-01-22 NOTE — Progress Notes (Signed)
DAR NOTE: Patient presents with a blunt affect and depressed mood.  Denies suicidal thoughts, pain, auditory and visual hallucinations.  Dressing to left forearm changed.  No S/S of infection noted.  Described energy level as low with good concentration.  Rates depression at 6, hopelessness at 5, and anxiety at 2.  Maintained on routine safety checks.  Medications given as prescribed.  Support and encouragement offered as needed.  States goal for today is "depression."  Patient visible in milieu with minimal interaction. Patient is safe on and off the unit.  Offered no complaint.

## 2022-01-22 NOTE — [facility] Group Notes (Signed)
Spiritual care group on grief and loss facilitated by chaplain Janne Napoleon, [facility]   Group Goal:   Support / Education around grief and loss   Members engage in facilitated group support and psycho-social education.   Group Description:   Following introductions and group rules, group members engaged in facilitated group dialog and support around topic of loss, with particular support around experiences of loss in their lives. Group Identified types of loss (relationships / self / things) and identified patterns, circumstances, and changes that precipitate losses. Reflected on thoughts / feelings around loss, normalized grief responses, and recognized variety in grief experience. Group noted Worden's four tasks of grief in discussion.   Group drew on Adlerian / Rogerian, narrative, MI,   Patient Progress: Jonathan [name] attended group and actively engaged and participate in group conversation for the time that he was there.  Chaplain Janne Napoleon, [place] [facility]ger, [phone]

## 2022-01-22 NOTE — Group Note (Signed)
LCSW Group Therapy Note   Group Date: 01/22/2022 Start Time: 1300 End Time: 1400  Type of Therapy: Group Therapy: Boundaries  Participation: Did not attend   Description of Group: This group will address the use of boundaries in their personal lives. Patients will explore why boundaries are important, the difference between healthy and unhealthy boundaries, and negative and postive outcomes of different boundaries and will look at how boundaries can be crossed.  Patients will be encouraged to identify current boundaries in their own lives and identify what kind of boundary is being set. Facilitators will guide patients in utilizing problem-solving interventions to address and correct types boundaries being used and to address when no boundary is being used. Understanding and applying boundaries will be explored and addressed for obtaining and maintaining a balanced life. Patients will be encouraged to explore ways to assertively make their boundaries and needs known to significant others in their lives, using other group members and facilitator for role play, support, and feedback.   Therapeutic Goals: 1. Patient will identify areas in their life where setting clear boundaries could be used to improve their life.  2. Patient will identify signs/triggers that a boundary is not being respected. 3. Patient will identify two ways to set boundaries in order to achieve balance in their lives: 4. Patient will demonstrate ability to communicate their needs and set boundaries through discussion and/or role plays  Summary of Progress/Problems: Did not attend   Skie Vitrano M Dareth Andrew, LCSWA 01/22/2022  2:14 PM    

## 2022-01-22 NOTE — Progress Notes (Signed)
[facility] MD Progress Note  01/22/2022 3:06 PM Jonathan [name]  MRN:  [id number] Principal Problem: MDD (major depressive disorder), recurrent severe, without psychosis (HCC) Diagnosis: Principal Problem:   MDD (major depressive disorder), recurrent severe, without psychosis (HCC) Active Problems:   Alcohol use disorder   Delta-9-tetrahydrocannabinol (THC) dependence (HCC)   Cocaine use disorder (HCC)  Reason for admission: Jonathan [name] is a 26 year old African-American male with a prior mental health history of MDD who was taken to the [facility] by EMS after a suicide attempt in which he made cuts to his left wrist, and called 911 in the context of socioeconomic stressors & polysubstance abuse.  Patient was involuntarily transferred to this [facility] for treatment and stabilization of his mood.    24 hour chart review: Vital signs with blood pressure continuing to stay slightly elevated with SBP in the 140s. Patient is compliant with all of his scheduled medications.  He received trazodone 50 mg last night for sleep and hydroxyzine 25 mg last night for anxiety.  Patient is attending unit group sessions and is participating.  He slept for a total of 8 hours last night as per nursing documentation.  Patient assessment note (01/22/22): Pt continues to present with flat affect and depressed mood, attention to personal hygiene and grooming is poor, and the need to tend to personal hygiene and grooming reiterated. Eye contact is fair, speech is clear & coherent. Thought contents are organized and logical, and pt denies SI/HI/AVH or paranoia. There is no evidence of delusional thoughts.    Orders received from wound care nurse on dressing changes of wound to left forearm. Vaseline gauze not available at [facility], and has been ordered by unit secretary to be delivered tomorrow. For now, nursing will continue to cleanse area with soap and water, pat dry, apply Bacitracin ointment to area, apply non adherent  dressing, cover with ABD dsg, and secure with cloth tape.  Patient continues to report an improvement in overall mood since hospitalization. He rates depression today as 6 (10 being worst), rates anxiety as 2 (10 being worst). He reports a good appetite, and reports reports a good sleep quality last night. He denies being in any physical pain, and reports last BM as being yesterday.  He continues to deny any medication related side effects, and is tolerating being on Zoloft for management of his depressive symptoms. We are continuing Zoloft and increase to 150 mg starting 10/24 along with other medications as listed below.    Total Time spent with patient: 30 minutes  Past Psychiatric History: MDD, polysubstance abuse  Past Medical History:  Past Medical History:  Diagnosis Date   Bipolar 1 disorder (HCC)    History reviewed. No pertinent surgical history. Family History: History reviewed. No pertinent family history. Family Psychiatric  History: Polysubstance abuse in mother Social History:  Social History   Substance and Sexual Activity  Alcohol Use Yes   Alcohol/week: 2.0 standard drinks of alcohol   Types: 2 Cans of beer per week     Social History   Substance and Sexual Activity  Drug Use Yes   Types: Marijuana    Social History   Socioeconomic History   Marital status: Single    Spouse name: Not on file   Number of children: Not on file   Years of education: Not on file   Highest education level: Not on file  Occupational History   Not on file  Tobacco Use   Smoking status: Every  Day    Packs/day: 1.00    Types: Cigarettes   Smokeless tobacco: Never  Substance and Sexual Activity   Alcohol use: Yes    Alcohol/week: 2.0 standard drinks of alcohol    Types: 2 Cans of beer per week   Drug use: Yes    Types: Marijuana   Sexual activity: Yes    Birth control/protection: Condom  Other Topics Concern   Not on file  Social History Narrative   ** Merged History  Encounter **       Social Determinants of Health   Financial Resource Strain: Not on file  Food Insecurity: No Food Insecurity (01/19/2022)   Hunger Vital Sign    Worried About Running Out of Food in the Last Year: Never true    Ran Out of Food in the Last Year: Never true  Transportation Needs: No Transportation Needs (01/19/2022)   PRAPARE - Hydrologist (Medical): No    Lack of Transportation (Non-Medical): No  Physical Activity: Not on file  Stress: Not on file  Social Connections: Not on file   Additional Social History:   Sleep: Good  Appetite:  Good  Current Medications: Current Facility-Administered Medications  Medication Dose Route Frequency Provider Last Rate Last Admin   acetaminophen (TYLENOL) tablet 650 mg  650 mg Oral Q6H PRN Rozetta Nunnery, NP   650 mg at 01/19/22 1838   alum & mag hydroxide-simeth (MAALOX/MYLANTA) 200-200-20 MG/5ML suspension 30 mL  30 mL Oral Q4H PRN Rozetta Nunnery, NP       hydrOXYzine (ATARAX) tablet 25 mg  25 mg Oral TID PRN Rozetta Nunnery, NP   25 mg at 01/21/22 2109   loperamide (IMODIUM) capsule 2-4 mg  2-4 mg Oral PRN Nicholes Rough, NP       LORazepam (ATIVAN) tablet 1 mg  1 mg Oral Q6H PRN Kaiden Pech, NP       magnesium hydroxide (MILK OF MAGNESIA) suspension 30 mL  30 mL Oral Daily PRN Rozetta Nunnery, NP       multivitamin with minerals tablet 1 tablet  1 tablet Oral Daily Marketta Valadez, NP   1 tablet at 01/22/22 9629   neomycin-polymyxin-pramoxine (NEOSPORIN PLUS) cream   Topical BID Dian Situ, MD   Given at 01/22/22 0825   nicotine polacrilex (NICORETTE) gum 2 mg  2 mg Oral PRN Massengill, Ovid Curd, MD       ondansetron (ZOFRAN-ODT) disintegrating tablet 4 mg  4 mg Oral Q6H PRN Nicholes Rough, NP       sertraline (ZOLOFT) tablet 100 mg  100 mg Oral Daily Chucky Homes, NP   100 mg at 01/22/22 5284   thiamine (Vitamin B-1) tablet 100 mg  100 mg Oral Daily Nicholes Rough, NP   100 mg at 01/22/22 1324    thiamine (VITAMIN B1) injection 100 mg  100 mg Intramuscular Once Nicholes Rough, NP       traZODone (DESYREL) tablet 50 mg  50 mg Oral QHS PRN Rozetta Nunnery, NP   50 mg at 01/21/22 2108    Lab Results:  Results for orders placed or performed during the hospital encounter of 01/19/22 (from the past 48 hour(s))  Urinalysis, Routine w reflex microscopic Urine, Clean Catch     Status: None   Collection Time: 01/20/22  5:49 PM  Result Value Ref Range   Color, Urine YELLOW YELLOW   APPearance CLEAR CLEAR   Specific Gravity, Urine 1.010 1.005 - 1.030  pH 6.5 5.0 - 8.0   Glucose, UA NEGATIVE NEGATIVE mg/dL   Hgb urine dipstick NEGATIVE NEGATIVE   Bilirubin Urine NEGATIVE NEGATIVE   Ketones, ur NEGATIVE NEGATIVE mg/dL   Protein, ur NEGATIVE NEGATIVE mg/dL   Nitrite NEGATIVE NEGATIVE   Leukocytes,Ua NEGATIVE NEGATIVE    Comment: Microscopic not done on urines with negative protein, blood, leukocytes, nitrite, or glucose < 500 mg/dL. Performed at [facility], [address] [place], [place] [address]   VITAMIN D 25 Hydroxy (Vit-D Deficiency, Fractures)     Status: Abnormal   Collection Time: 01/21/22  6:46 AM  Result Value Ref Range   Vit D, 25-Hydroxy 13.00 (L) 30 - 100 ng/mL    Comment: (NOTE) Vitamin D deficiency has been defined by the Institute of Medicine  and an Endocrine Society practice guideline as a level of serum 25-OH  vitamin D less than 20 ng/mL (1,2). The Endocrine Society went on to  further define vitamin D insufficiency as a level between 21 and 29  ng/mL (2).  1. IOM (Institute of Medicine). 2010. Dietary reference intakes for  calcium and D. [place]: [employer]. 2. Holick MF, [place], Bischoff-Ferrari HA, et al. Evaluation,  treatment, and prevention of vitamin D deficiency: an Endocrine  Society clinical practice guideline, JCEM. 2011 Jul; 96(7): 1911-30.  Performed at [facility], [address]  [place], [place] [address]   Vitamin B12     Status: None   Collection Time: 01/21/22  6:46 AM  Result Value Ref Range   Vitamin B-12 256 180 - 914 pg/mL    Comment: (NOTE) This assay is not validated for testing neonatal or myeloproliferative syndrome specimens for Vitamin B12 levels. Performed at [facility], [address] [place], [place] [address]     Blood Alcohol level:  Lab Results  Component Value Date   ETH <10 01/19/2022   ETH <10 05/20/2017    Metabolic Disorder Labs: Lab Results  Component Value Date   HGBA1C 5.1 01/20/2022   MPG 99.67 01/20/2022   No results found for: "PROLACTIN" Lab Results  Component Value Date   CHOL 196 01/20/2022   TRIG 140 01/20/2022   HDL 37 (L) 01/20/2022   CHOLHDL 5.3 01/20/2022   VLDL 28 01/20/2022   LDLCALC 131 (H) 01/20/2022    Physical Findings: AIMS: Facial and Oral Movements Muscles of Facial Expression: None, normal Lips and Perioral Area: None, normal Jaw: None, normal Tongue: None, normal,Extremity Movements Upper (arms, wrists, hands, fingers): None, normal Lower (legs, knees, ankles, toes): None, normal, Trunk Movements Neck, shoulders, hips: None, normal, Overall Severity Severity of abnormal movements (highest score from questions above): None, normal Incapacitation due to abnormal movements: None, normal Patient's awareness of abnormal movements (rate only patient's report): No Awareness, Dental Status Current problems with teeth and/or dentures?: No Does patient usually wear dentures?: No  CIWA:  CIWA-Ar Total: 1 COWS:     Musculoskeletal: Strength & Muscle Tone: within normal limits Gait & Station: normal Patient leans: N/A  Psychiatric Specialty Exam:  Presentation  General Appearance:  Disheveled  Eye Contact: Fair  Speech: Clear and Coherent  Speech Volume: Normal  Handedness: Right   Mood and Affect  Mood: Depressed  Affect: Congruent   Thought Process   Thought Processes: Coherent  Descriptions of Associations:Intact  Orientation:Full (Time, Place and Person)  Thought Content:Logical  History of Schizophrenia/Schizoaffective disorder:No  Duration of Psychotic Symptoms:No data recorded Hallucinations:Hallucinations: None  Ideas of Reference:None  Suicidal Thoughts:Suicidal Thoughts: No  Homicidal Thoughts:Homicidal Thoughts: No   Sensorium  Memory: Immediate Good  Judgment: Fair  Insight: Fair   Chartered certified accountant: Fair  Attention Span: Fair  Recall: Fair  Fund of Knowledge: Fair  Language: Fair  Psychomotor Activity  Psychomotor Activity: Psychomotor Activity: Normal  Assets  Assets: Resilience  Sleep  Sleep: Sleep: Good  Physical Exam: Physical Exam Constitutional:      Appearance: Normal appearance.  HENT:     Head: Normocephalic.     Nose: Nose normal. No congestion or rhinorrhea.  Eyes:     Pupils: Pupils are equal, round, and reactive to light.  Pulmonary:     Effort: Pulmonary effort is normal.  Musculoskeletal:     Cervical back: Normal range of motion.  Neurological:     General: No focal deficit present.     Mental Status: He is alert and oriented to person, place, and time.     Sensory: No sensory deficit.     Coordination: Coordination normal.  Psychiatric:        Behavior: Behavior normal.    Review of Systems  Constitutional:  Negative for fever.  HENT:  Negative for sore throat.   Respiratory:  Negative for cough.   Cardiovascular:  Negative for chest pain.  Gastrointestinal:  Negative for heartburn.  Skin:  Negative for rash.  Neurological:  Negative for dizziness and headaches.  Psychiatric/Behavioral:  Positive for depression and substance abuse. Negative for hallucinations, memory loss and suicidal ideas. The patient has insomnia. The patient is not nervous/anxious.    Blood pressure (!) 143/79, pulse 77, temperature 98.6 F (37 C),  temperature source Oral, resp. rate 17, height 5\' 8"  (1.727 m), weight 72.6 kg, SpO2 100 %. Body mass index is 24.33 kg/m.  Treatment Plan Summary:  Treatment Plan Summary: Daily contact with patient to assess and evaluate symptoms and progress in treatment and Medication management   Observation Level/Precautions:  15 minute checks  Laboratory:  Labs reviewed   Psychotherapy:  Unit Group sessions  Medications:  See [facility]  Consultations:  To be determined   Discharge Concerns:  Safety, medication compliance, mood stability  Estimated LOS: 5-7 days  Other:  N/A    PLAN Safety and Monitoring: Voluntary admission to inpatient psychiatric unit for safety, stabilization and treatment Daily contact with patient to assess and evaluate symptoms and progress in treatment Patient's case to be discussed in multi-disciplinary team meeting Observation Level : q15 minute checks Vital signs: q12 hours Precautions: Suicide   Long Term Goal(s): Improvement in symptoms so as ready for discharge   Short Term Goals: Ability to identify changes in lifestyle to reduce recurrence of condition will improve, Ability to verbalize feelings will improve, Ability to disclose and discuss suicidal ideas, Ability to demonstrate self-control will improve, Ability to identify and develop effective coping behaviors will improve, Compliance with prescribed medications will improve, and Ability to identify triggers associated with substance abuse/mental health issues will improve   Diagnoses  Principal Problem:   MDD (major depressive disorder), recurrent severe, without psychosis (HCC) Active Problems:   Alcohol use disorder   Delta-9-tetrahydrocannabinol (THC) dependence (HCC)   Cocaine use disorder (HCC)   Severe recurrent major depressive disorder without psychotic features (HCC) -Increase  Zoloft 150 mg starting 10/24-Pt educated on benefits, rationales and possible side effects of this medication including the  blackbox warning of the increased risk of SI when SSRIs are first initiated and is agreeable  to trials.   Anxiety -Continue Hydroxyzine 25 mg every 6 hours PRN   Insomnia -Continue Trazodone 50 mg nightly PRN for sleep   Nicotine addiction -Continue Nicorette gum PRN   Alcohol use d/o Monitor for signs of withdrawal -Continue Ativan 1mg  tabs every 6 hours PRN for CIWA >10  -Oral thiamine and MVI replacement -Abstinence from substances encouraged -SW to look into options for outpatient SA treatment at discharge    Other PRNS -Continue Tylenol 650 mg every 6 hours PRN for mild pain -Continue Maalox 30 mg every 4 hrs PRN for indigestion -Continue Imodium 2-4 mg as needed for diarrhea -Continue Milk of Magnesia as needed every 6 hrs for constipation -Continue Zofran disintegrating tabs every 6 hrs PRN for nausea    Discharge Planning: Social work and case management to assist with discharge planning and identification of hospital follow-up needs prior to discharge Estimated LOS: 5-7 days Discharge Concerns: Need to establish a safety plan; Medication compliance and effectiveness Discharge Goals: Return home with outpatient referrals for mental health follow-up including medication management/psychotherapy , NP 01/22/2022, 3:06 PMPatient ID: 01/24/2022, male   DOB: 06-Dec-1995, 26 y.o.   MRN: [id number]

## 2022-01-22 NOTE — Progress Notes (Signed)
Adult Psychoeducational Group Note  Date:  01/22/2022 Time:  9:37 PM  Group Topic/Focus:  AA Group  Participation Level:  Did Not Attend  Participation Quality:   n/a  Affect:   n/a  Cognitive:   n/a  Insight: None  Engagement in Group:   n/a  Modes of Intervention:   n/a  Additional Comments:   Pt did not attend the AA group.  Jonathan [name] [name] 01/22/2022, 9:37 PM

## 2022-01-22 NOTE — Progress Notes (Signed)
   01/22/22 0500  Sleep  Number of Hours 8

## 2022-01-23 DIAGNOSIS — F332 Major depressive disorder, recurrent severe without psychotic features: Secondary | ICD-10-CM | POA: Diagnosis not present

## 2022-01-23 NOTE — Progress Notes (Signed)
Adult Psychoeducational Group Note  Date:  01/23/2022 Time:  9:55 PM  Group Topic/Focus:  Wrap-Up Group:   The focus of this group is to help patients review their daily goal of treatment and discuss progress on daily workbooks.  Participation Level:  Active  Participation Quality:  Appropriate  Affect:  Appropriate  Cognitive:  Appropriate  Insight: Appropriate  Engagement in Group:  Engaged  Modes of Intervention:  Discussion and Support  Additional Comments:   Pt attended and participated in the [place] group. Pt denied SI/HI/AVH. Pt rated his day 7/10. Pt reports some progress toward attending more scheduled therapeutic groups. Pt stated that he misses groups because he's asleep. Pt agreed to ask the assigned MHT to wake him up so that he can go to the groups. Pt plans to read more and go outside for fresh air to improve his mood and coping.  Jonathan [name] 01/23/2022, 9:55 PM

## 2022-01-23 NOTE — Progress Notes (Signed)
   01/23/22 1000  Psych Admission Type (Psych Patients Only)  Admission Status Involuntary  Psychosocial Assessment  Patient Complaints Depression  Eye Contact Fair  Facial Expression Flat  Affect Depressed  Speech Logical/coherent  Interaction Assertive  Motor Activity Other (Comment)  Appearance/Hygiene Unremarkable  Behavior Characteristics Cooperative  Mood Depressed  Thought Process  Coherency WDL  Content WDL  Delusions WDL  Perception WDL  Hallucination None reported or observed  Judgment WDL  Confusion None  Danger to Self  Current suicidal ideation? Denies  Danger to Others  Danger to Others None reported or observed

## 2022-01-23 NOTE — Progress Notes (Signed)
[facility] MD Progress Note  01/23/2022 2:18 PM Jonathan [name]  MRN:  [id number] Principal Problem: MDD (major depressive disorder), recurrent severe, without psychosis ([place]) Diagnosis: Principal Problem:   MDD (major depressive disorder), recurrent severe, without psychosis ([place]) Active Problems:   Alcohol use disorder   Delta-9-tetrahydrocannabinol (THC) dependence ([place])   Cocaine use disorder ([place])  Reason for admission: Jonathan [name] is a 26 year old African-American male with a prior mental health history of MDD who was taken to the [facility] ED by EMS after a suicide attempt in which he made cuts to his left wrist, and called 911 in the context of socioeconomic stressors & polysubstance abuse.  Patient was involuntarily transferred to this [facility] for treatment and stabilization of his mood.    24 hour chart review: Vital signs WNL for the past 24hrs. Patient is continuing to be compliant with all of his scheduled medications.  He received trazodone 50 mg last night for sleep and hydroxyzine 25 mg last night for anxiety.  Patient is attending unit group sessions and is participating.  He slept for a total of 7.25 hours last night as per nursing documentation. Pt has attended some unit group sessions in the past 24 hrs.  Patient assessment note (01/23/22): Pt continues to present with depressed mood, but states that mood has significantly improved since hospitalization. His attention to personal hygiene and grooming is fair today. Eye contact is fair, speech is clear & coherent. Thought contents are organized and logical, and pt denies SI/HI/AVH or paranoia. There is no evidence of delusional thoughts.    Patient reports a good sleep quality last night he states that he slept between 7 to 8 hours, he reports a good appetite, and denies being in any physical distress today.  Patient reports that he had a bowel movement today, he denies any medication related side effects.  Writer discussed the  PHP program with patient after discharge since he has a poor support network.  Patient is agreeable to [facility] after discharge, and states that he lives with his sister who is older than himself, and somewhat supportive.  CSW made aware that patient is interested in the [facility] program, but program would be virtual due to no insurance thereby rendering his choice limited to the [facility] PHP program. Dressing to the left forearm clean and dry and intact. We are continuing medications as listed below with no adjustments made today.  Total Time spent with patient: 30 minutes  Past Psychiatric History: MDD, polysubstance abuse  Past Medical History:  Past Medical History:  Diagnosis Date   Bipolar 1 disorder ([place])    History reviewed. No pertinent surgical history. Family History: History reviewed. No pertinent family history. Family Psychiatric  History: Polysubstance abuse in mother Social History:  Social History   Substance and Sexual Activity  Alcohol Use Yes   Alcohol/week: 2.0 standard drinks of alcohol   Types: 2 Cans of beer per week     Social History   Substance and Sexual Activity  Drug Use Yes   Types: Marijuana    Social History   Socioeconomic History   Marital status: Single    Spouse name: Not on file   Number of children: Not on file   Years of education: Not on file   Highest education level: Not on file  Occupational History   Not on file  Tobacco Use   Smoking status: Every Day    Packs/day: 1.00    Types: Cigarettes   Smokeless tobacco:  Never  Substance and Sexual Activity   Alcohol use: Yes    Alcohol/week: 2.0 standard drinks of alcohol    Types: 2 Cans of beer per week   Drug use: Yes    Types: Marijuana   Sexual activity: Yes    Birth control/protection: Condom  Other Topics Concern   Not on file  Social History Narrative   ** Merged History Encounter **       Social Determinants of Health   Financial Resource Strain: Not on file  Food Insecurity:  No Food Insecurity (01/19/2022)   Hunger Vital Sign    Worried About Running Out of Food in the Last Year: Never true    Ran Out of Food in the Last Year: Never true  Transportation Needs: No Transportation Needs (01/19/2022)   PRAPARE - Administrator, Civil Service (Medical): No    Lack of Transportation (Non-Medical): No  Physical Activity: Not on file  Stress: Not on file  Social Connections: Not on file   Additional Social History:   Sleep: Good  Appetite:  Good  Current Medications: Current Facility-Administered Medications  Medication Dose Route Frequency Provider Last Rate Last Admin   acetaminophen (TYLENOL) tablet 650 mg  650 mg Oral Q6H PRN Jackelyn Poling, NP   650 mg at 01/19/22 1838   alum & mag hydroxide-simeth (MAALOX/MYLANTA) 200-200-20 MG/5ML suspension 30 mL  30 mL Oral Q4H PRN Jackelyn Poling, NP       hydrOXYzine (ATARAX) tablet 25 mg  25 mg Oral TID PRN Jackelyn Poling, NP   25 mg at 01/22/22 2132   loperamide (IMODIUM) capsule 2-4 mg  2-4 mg Oral PRN Starleen Blue, NP       LORazepam (ATIVAN) tablet 1 mg  1 mg Oral Q6H PRN Clarann Helvey, NP       magnesium hydroxide (MILK OF MAGNESIA) suspension 30 mL  30 mL Oral Daily PRN Jackelyn Poling, NP       multivitamin with minerals tablet 1 tablet  1 tablet Oral Daily Saxton Chain, NP   1 tablet at 01/23/22 0825   neomycin-bacitracin-polymyxin (NEOSPORIN) ointment   Topical Daily Starleen Blue, NP   Given at 01/23/22 9937   neomycin-polymyxin-pramoxine (NEOSPORIN PLUS) cream   Topical Daily Delona Clasby, Tyler Aas, NP       nicotine polacrilex (NICORETTE) gum 2 mg  2 mg Oral PRN Massengill, Harrold Donath, MD       ondansetron (ZOFRAN-ODT) disintegrating tablet 4 mg  4 mg Oral Q6H PRN Starleen Blue, NP       sertraline (ZOLOFT) tablet 150 mg  150 mg Oral Daily Sharin Altidor, NP   150 mg at 01/23/22 0825   thiamine (Vitamin B-1) tablet 100 mg  100 mg Oral Daily Taylore Hinde, NP   100 mg at 01/23/22 0825   thiamine  (VITAMIN B1) injection 100 mg  100 mg Intramuscular Once Starleen Blue, NP       traZODone (DESYREL) tablet 50 mg  50 mg Oral QHS PRN Nira Conn A, NP   50 mg at 01/22/22 2132    Lab Results:  No results found for this or any previous visit (from the past 48 hour(s)).   Blood Alcohol level:  Lab Results  Component Value Date   [facility] <10 01/19/2022   ETH <10 05/20/2017    Metabolic Disorder Labs: Lab Results  Component Value Date   HGBA1C 5.1 01/20/2022   MPG 99.67 01/20/2022   No results found for: "  PROLACTIN" Lab Results  Component Value Date   CHOL 196 01/20/2022   TRIG 140 01/20/2022   HDL 37 (L) 01/20/2022   CHOLHDL 5.3 01/20/2022   VLDL 28 01/20/2022   LDLCALC 131 (H) 01/20/2022    Physical Findings: AIMS: Facial and Oral Movements Muscles of Facial Expression: None, normal Lips and Perioral Area: None, normal Jaw: None, normal Tongue: None, normal,Extremity Movements Upper (arms, wrists, hands, fingers): None, normal Lower (legs, knees, ankles, toes): None, normal, Trunk Movements Neck, shoulders, hips: None, normal, Overall Severity Severity of abnormal movements (highest score from questions above): None, normal Incapacitation due to abnormal movements: None, normal Patient's awareness of abnormal movements (rate only patient's report): No Awareness, Dental Status Current problems with teeth and/or dentures?: No Does patient usually wear dentures?: No  CIWA:  CIWA-Ar Total: 0 COWS:     Musculoskeletal: Strength & Muscle Tone: within normal limits Gait & Station: normal Patient leans: N/A  Psychiatric Specialty Exam:  Presentation  General Appearance:  Appropriate for Environment; Fairly Groomed  Eye Contact: Fair  Speech: Clear and Coherent  Speech Volume: Normal  Handedness: Right   Mood and Affect  Mood: Depressed  Affect: Congruent   Thought Process  Thought Processes: Coherent  Descriptions of  Associations:Intact  Orientation:Full (Time, Place and Person)  Thought Content:Logical  History of Schizophrenia/Schizoaffective disorder:No  Duration of Psychotic Symptoms:No data recorded Hallucinations:Hallucinations: None  Ideas of Reference:None  Suicidal Thoughts:Suicidal Thoughts: No  Homicidal Thoughts:Homicidal Thoughts: No   Sensorium  Memory: Immediate Good  Judgment: Fair  Insight: Fair   Art therapist  Concentration: Good  Attention Span: Good  Recall: Good  Fund of Knowledge: Good  Language: Good  Psychomotor Activity  Psychomotor Activity: Psychomotor Activity: Normal  Assets  Assets: Communication Skills  Sleep  Sleep: Sleep: Good  Physical Exam: Physical Exam Constitutional:      Appearance: Normal appearance.  HENT:     Head: Normocephalic.     Nose: Nose normal. No congestion or rhinorrhea.  Eyes:     Pupils: Pupils are equal, round, and reactive to light.  Pulmonary:     Effort: Pulmonary effort is normal.  Musculoskeletal:     Cervical back: Normal range of motion.  Neurological:     General: No focal deficit present.     Mental Status: He is alert and oriented to person, place, and time.     Sensory: No sensory deficit.     Coordination: Coordination normal.  Psychiatric:        Behavior: Behavior normal.    Review of Systems  Constitutional:  Negative for fever.  HENT:  Negative for sore throat.   Respiratory:  Negative for cough.   Cardiovascular:  Negative for chest pain.  Gastrointestinal:  Negative for heartburn.  Skin:  Negative for rash.  Neurological:  Negative for dizziness and headaches.  Psychiatric/Behavioral:  Positive for depression and substance abuse. Negative for hallucinations, memory loss and suicidal ideas. The patient has insomnia. The patient is not nervous/anxious.    Blood pressure 131/83, pulse 76, temperature (!) 97.5 F (36.4 C), temperature source Oral, resp. rate 18,  height 5\' 8"  (1.727 m), weight 72.6 kg, SpO2 98 %. Body mass index is 24.33 kg/m.  Treatment Plan Summary:  Treatment Plan Summary: Daily contact with patient to assess and evaluate symptoms and progress in treatment and Medication management   Observation Level/Precautions:  15 minute checks  Laboratory:  Labs reviewed   Psychotherapy:  Unit Group sessions  Medications:  See  MAR  Consultations:  To be determined   Discharge Concerns:  Safety, medication compliance, mood stability  Estimated LOS: 5-7 days  Other:  N/A    PLAN Safety and Monitoring: Voluntary admission to inpatient psychiatric unit for safety, stabilization and treatment Daily contact with patient to assess and evaluate symptoms and progress in treatment Patient's case to be discussed in multi-disciplinary team meeting Observation Level : q15 minute checks Vital signs: q12 hours Precautions: Suicide   Long Term Goal(s): Improvement in symptoms so as ready for discharge   Short Term Goals: Ability to identify changes in lifestyle to reduce recurrence of condition will improve, Ability to verbalize feelings will improve, Ability to disclose and discuss suicidal ideas, Ability to demonstrate self-control will improve, Ability to identify and develop effective coping behaviors will improve, Compliance with prescribed medications will improve, and Ability to identify triggers associated with substance abuse/mental health issues will improve   Diagnoses  Principal Problem:   MDD (major depressive disorder), recurrent severe, without psychosis (HCC) Active Problems:   Alcohol use disorder   Delta-9-tetrahydrocannabinol (THC) dependence (HCC)   Cocaine use disorder (HCC)   Severe recurrent major depressive disorder without psychotic features (HCC) -Continue  Zoloft 150 mg -Pt educated on benefits, rationales and possible side effects of this medication including the blackbox warning of the increased risk of SI when  SSRIs are first initiated and is agreeable to trials.   Anxiety -Continue Hydroxyzine 25 mg every 6 hours PRN   Insomnia -Continue Trazodone 50 mg nightly PRN for sleep   Nicotine addiction -Continue Nicorette gum PRN   Alcohol use d/o Monitor for signs of withdrawal -Continue Ativan 1mg  tabs every 6 hours PRN for CIWA >10  -Oral thiamine and MVI replacement -Abstinence from substances encouraged -SW to look into options for outpatient SA treatment at discharge    Other PRNS -Continue Tylenol 650 mg every 6 hours PRN for mild pain -Continue Maalox 30 mg every 4 hrs PRN for indigestion -Continue Imodium 2-4 mg as needed for diarrhea -Continue Milk of Magnesia as needed every 6 hrs for constipation -Continue Zofran disintegrating tabs every 6 hrs PRN for nausea    Discharge Planning: Social work and case management to assist with discharge planning and identification of hospital follow-up needs prior to discharge Estimated LOS: 5-7 days Discharge Concerns: Need to establish a safety plan; Medication compliance and effectiveness Discharge Goals: Return home with outpatient referrals for mental health follow-up including medication management/psychotherapy , NP 01/23/2022, 2:18 PMPatient ID: 01/25/2022, male   DOB: 30-Apr-1995, 26 y.o.   MRN: [id number] Patient ID: Jonathan [name], male   DOB: June 04, 1995, 26 y.o.   MRN: [id number]

## 2022-01-23 NOTE — BHH Suicide Risk Assessment (Signed)
[place] INPATIENT:  Family/Significant Other Suicide Prevention Education  Suicide Prevention Education:  Education Completed; Jonathan [name] [phone] (Girlfriend) has been identified by the patient as the family member/significant other with whom the patient will be residing, and identified as the person(s) who will aid the patient in the event of a mental health crisis (suicidal ideations/suicide attempt).  With written consent from the patient, the family member/significant other has been provided the following suicide prevention education, prior to the and/or following the discharge of the patient.  The suicide prevention education provided includes the following: Suicide risk factors Suicide prevention and interventions National Suicide Hotline telephone number [facility] assessment telephone number [facility] Emergency Assistance [place] and/or Residential Mobile Crisis Unit telephone number  Request made of family/significant other to: Remove weapons (e.g., guns, rifles, knives), all items previously/currently identified as safety concern.   Remove drugs/medications (over-the-counter, prescriptions, illicit drugs), all items previously/currently identified as a safety concern.  The family member/significant other verbalizes understanding of the suicide prevention education information provided.  The family member/significant other agrees to remove the items of safety concern listed above.  CSW spoke with Jonathan [name] who states that her boyfriend does not talk about suicidal thoughts, his anxiety, or depression often.  She states that he usually discusses "not being where he things he should be or wants to be with his life so far".  She states that they do not live together at this time and that he lives with his sister.  She states that she visits with him several times a week and is there as a support regularly.  She states that she is his main support and  that he does not have very man friend or a good support system. Jonathan [name] states that there are no firearms or weapons in his possession and she is not aware of any in his sister's home.  CSW completed SPE with Jonathan [name].  Jonathan [name] 01/23/2022, 3:52 PM

## 2022-01-23 NOTE — BHH Group Notes (Signed)
[place] Group Notes:  (Nursing/MHT/Case Management/Adjunct)  Date:  01/23/2022  Time:  1:23 PM  Type of Therapy:  Goals Group: The focus of this group is to help patients establish daily goals to achieve during treatment and discuss how the patient can incorporate goal setting into their daily lives to aide in recovery.   Participation Level:  Did Not Attend   Summary of Progress/Problems:   Patient did not attend goals group today.   [name] R Treasa Bradshaw 01/23/2022, 1:23 PM

## 2022-01-23 NOTE — Progress Notes (Signed)
   01/23/22 0045  Psych Admission Type (Psych Patients Only)  Admission Status Involuntary  Psychosocial Assessment  Patient Complaints Depression  Eye Contact Fair  Facial Expression Flat  Affect Anxious  Speech Logical/coherent  Interaction Assertive  Motor Activity Slow  Appearance/Hygiene Unremarkable  Behavior Characteristics Cooperative  Mood Depressed  Aggressive Behavior  Effect No apparent injury  Thought Process  Content WDL  Delusions WDL  Perception WDL  Hallucination None reported or observed  Judgment WDL  Confusion None  Danger to Self  Current suicidal ideation? Denies

## 2022-01-24 DIAGNOSIS — F332 Major depressive disorder, recurrent severe without psychotic features: Secondary | ICD-10-CM | POA: Diagnosis not present

## 2022-01-24 MED ORDER — HYDROXYZINE HCL 25 MG PO TABS: 25.0000 mg | ORAL_TABLET | Freq: Three times a day (TID) | ORAL | 0 refills | Status: DC | PRN

## 2022-01-24 MED ORDER — SERTRALINE HCL 50 MG PO TABS
150.0000 mg | ORAL_TABLET | Freq: Every day | ORAL | Status: DC
  Filled 2022-01-24: qty 21

## 2022-01-24 MED ORDER — TRAZODONE HCL 50 MG PO TABS: 50.0000 mg | ORAL_TABLET | Freq: Every evening | ORAL | 0 refills | Status: DC | PRN

## 2022-01-24 MED ORDER — SERTRALINE HCL 50 MG PO TABS: 150.0000 mg | ORAL_TABLET | Freq: Every day | ORAL | 0 refills | Status: DC

## 2022-01-24 MED ORDER — NICOTINE POLACRILEX 2 MG MT GUM: 2.0000 mg | CHEWING_GUM | OROMUCOSAL | 0 refills | Status: DC | PRN

## 2022-01-24 MED ORDER — ADULT MULTIVITAMIN W/MINERALS CH: 1.0000 | ORAL_TABLET | Freq: Every day | ORAL | Status: DC

## 2022-01-24 NOTE — Progress Notes (Signed)
   01/24/22 0551  Sleep  Number of Hours 6.25

## 2022-01-24 NOTE — Progress Notes (Signed)
Pt denied SI/HI/AVH. Pt denied having anxiety or depression this morning. Pt reports that he slept well last night. Pt reports that he is ready to be discharged because he has training for a new job tomorrow and Friday. Pt has been cooperative during interactions. Pt given scheduled medications as prescribed. Q15 min checks verified for safety. Pt is safe on the unit.    01/24/22 0754  Psych Admission Type (Psych Patients Only)  Admission Status Involuntary  Psychosocial Assessment  Patient Complaints None  Eye Contact Brief  Facial Expression Anxious  Affect Anxious  Speech Logical/coherent  Interaction Assertive  Motor Activity Other (Comment) (WDL)  Appearance/Hygiene Unremarkable  Behavior Characteristics Cooperative  Mood Anxious  Thought Process  Coherency WDL  Content WDL  Delusions None reported or observed  Perception WDL  Hallucination None reported or observed  Judgment WDL  Confusion None  Danger to Self  Current suicidal ideation? Denies  Danger to Others  Danger to Others None reported or observed

## 2022-01-24 NOTE — Progress Notes (Signed)
  [facility] Adult Case Management Discharge Plan :  Will you be returning to the same living situation after discharge:  Yes,  Home with sister  At discharge, do you have transportation home?: Yes,  Girlfriend/Friend  Do you have the ability to pay for your medications: Yes,  Family/Community Support   Release of information consent forms completed and in the chart;  Patient's signature needed at discharge.  Patient to Follow up at:  [facility] up.   Specialty: Behavioral Health Why: After completion of the PHP program, please schedule an assessment, to obtain therapy and medication management services; on a Monday or a Wednesday morning, arrive by 7:30 am.  Services are provided on a first come, first served basis.  Once you have scheduled and completed your assessment, appointments will be scheduled for therapy and medication management. Contact information: [facility] Follow up on 01/30/2022.   Specialty: Behavioral Health Why: You are scheduled for an appointment for the Partial Hospitalization Program on 01/30/22 at 10:00am. This appointment will last approximately one hour and will be virtual via Webex. You will receive an email invite the day of the appointment. PHP is virtual group therapy that runs Mon-Fri from 9am-1pm. Please download the [employer] app prior to the appointment. If you need to cancel or reschedule, please call [phone] Contact information: [place] [phone]                Next level of care provider has access to [place] and Suicide Prevention discussed: Yes,  with patient, girlfriend, and sister     Has patient been referred to the Quitline?: Patient refused referral  Patient has been referred for addiction treatment: Pt.  refused referral, Pt was agreeable to and referred to [facility] services.  Darleen Crocker, LCSWA 01/24/2022, 3:46 PM

## 2022-01-24 NOTE — Group Note (Signed)
LCSW Group Therapy Note   Group Date: 01/24/2022 Start Time: 1300 End Time: 1400  Type of Therapy and Topic: Group Therapy: Effective Communication   Participation Level: Minimal   Description of Group:  In this group patients will be asked to identify their own styles of communication as well as defining and identifying passive, assertive, and aggressive styles of communication. Participants will identify strategies to communicate in a more assertive manner in an effort to appropriately meet their needs. This group will be process-oriented, with patients participating in exploration of their own experiences as well as giving and receiving support and challenge from other group members.   Therapeutic Goals: 1. Patient will identify their personal communication style. 2. Patient will identify passive, assertive, and aggressive forms of communication. 3. Patient will identify strategies for developing more effective communication to appropriately meet their needs.    Summary of Patient Progress: The Pt attended group and remained there the entire time.  The Pt accepted all worksheets and followed along throughout the group.  The Pt did not participate in the open discussion but was appropriate with their peers.      Therapeutic Modalities:  Communication Skills Solution Focused Therapy Motivational Interviewing  Darleen Crocker, [place] 01/24/2022  2:03 PM

## 2022-01-24 NOTE — Progress Notes (Signed)
D- Patient alert and oriented. Affect/mood. Denies SI, HI, AVH, and pain.Patient denies anxiety and depression, rating both a 0/10, however patient reports being concerned about his employment and being able to pay his rent. Patient approached this RN on three separate occasions inquiring about discharge and questioning whether his status is voluntary or involuntary.  A- Scheduled medications administered to patient, per MD orders. Support and encouragement provided.  Education on commitment status provided. Patient was encouraged to call his landlord and explain that he was currently hospitalized. Patient encouraged to ask CSW for excuse note for work. Routine safety checks conducted every 15 minutes.  Patient informed to notify staff with problems or concerns.  R- No adverse drug reactions noted. Patient contracts for safety at this time. Patient compliant with medications and treatment plan. Patient receptive, calm, and cooperative. Patient interacts well with others on the unit.  Patient remains safe at this time.

## 2022-01-24 NOTE — Plan of Care (Signed)
  Problem: Education: Goal: Emotional status will improve Outcome: Progressing Goal: Mental status will improve Outcome: Progressing Goal: Verbalization of understanding the information provided will improve Outcome: Progressing   Problem: Activity: Goal: Interest or engagement in activities will improve Outcome: Progressing Goal: Sleeping patterns will improve Outcome: Progressing   Problem: Coping: Goal: Ability to demonstrate self-control will improve Outcome: Progressing   Problem: Education: Goal: Knowledge of the prescribed therapeutic regimen will improve Outcome: Progressing   Problem: Coping: Goal: Coping ability will improve Outcome: Progressing   Problem: Self-Concept: Goal: Level of anxiety will decrease Outcome: Progressing

## 2022-01-24 NOTE — Plan of Care (Signed)
  Problem: Education: Goal: Emotional status will improve Outcome: Progressing Goal: Mental status will improve Outcome: Progressing   Problem: Activity: Goal: Interest or engagement in activities will improve Outcome: Progressing Goal: Sleeping patterns will improve Outcome: Progressing   Problem: Coping: Goal: Ability to demonstrate self-control will improve Outcome: Progressing   Problem: Coping: Goal: Coping ability will improve Outcome: Progressing Goal: Will verbalize feelings Outcome: Progressing

## 2022-01-24 NOTE — Progress Notes (Signed)
[facility] MD Progress Note  01/24/2022 3:51 PM Jonathan [name]  MRN:  [id number] Principal Problem: MDD (major depressive disorder), recurrent severe, without psychosis (HCC) Diagnosis: Principal Problem:   MDD (major depressive disorder), recurrent severe, without psychosis (HCC) Active Problems:   Alcohol use disorder   Delta-9-tetrahydrocannabinol (THC) dependence (HCC)   Cocaine use disorder (HCC)  Reason for admission: Jonathan [name] is a 26 year old African-American male with a prior mental health history of MDD who was taken to the [facility] ED by EMS after a suicide attempt in which he made cuts to his left wrist, and called 911 in the context of socioeconomic stressors & polysubstance abuse.  Patient was involuntarily transferred to this [facility] for treatment and stabilization of his mood.    24 hour chart review: Vital signs WNL for the past 24hrs with exception of slightly elevated HR earlier today morning at 55. Patient is continuing to be compliant with all of his scheduled medications.  He received trazodone 50 mg last night for sleep and hydroxyzine 25 mg last night for anxiety.  Patient is continuing, to attend unit group sessions and is participating.  He slept for a total of 6.25 hours last night as per nursing documentation. Pt has attended some unit group sessions in the past 24 hrs.  Patient assessment note (01/24/22): Pt presents today with a euthymic mood, and states that mood has significantly improved since hospitalization. His attention to personal hygiene and grooming is good today. Eye contact is fair, speech is clear & coherent. Thought contents are organized and logical, and pt denies SI/HI/AVH or paranoia. There is no evidence of delusional thoughts.    Patient continues to report a good sleep quality, and he states that he slept good last night. He denies being in any physical distress today. Right forearm wound with dsg clean, dry and intact. Pt denies any discomfort  to area. Wound is being cleaned and dressed by nursing on a daily basis.   Pt is verbalizing readiness for discharge, and plan is to discharge patient tomorrow morning.  He reports that he has a job training in [place], [place] and needs to leave tomorrow early in the morning because his transportation will be here at the hospital at 8:30 AM.  We will continue all medications as listed below with no adjustments made today.  Patient will be discharged tomorrow on medications as listed below.  Total Time spent with patient: 30 minutes  Past Psychiatric History: MDD, polysubstance abuse  Past Medical History:  Past Medical History:  Diagnosis Date   Bipolar 1 disorder (HCC)    History reviewed. No pertinent surgical history. Family History: History reviewed. No pertinent family history. Family Psychiatric  History: Polysubstance abuse in mother Social History:  Social History   Substance and Sexual Activity  Alcohol Use Yes   Alcohol/week: 2.0 standard drinks of alcohol   Types: 2 Cans of beer per week     Social History   Substance and Sexual Activity  Drug Use Yes   Types: Marijuana    Social History   Socioeconomic History   Marital status: Single    Spouse name: Not on file   Number of children: Not on file   Years of education: Not on file   Highest education level: Not on file  Occupational History   Not on file  Tobacco Use   Smoking status: Every Day    Packs/day: 1.00    Types: Cigarettes   Smokeless tobacco: Never  Substance  and Sexual Activity   Alcohol use: Yes    Alcohol/week: 2.0 standard drinks of alcohol    Types: 2 Cans of beer per week   Drug use: Yes    Types: Marijuana   Sexual activity: Yes    Birth control/protection: Condom  Other Topics Concern   Not on file  Social History Narrative   ** Merged History Encounter **       Social Determinants of Health   Financial Resource Strain: Not on file  Food Insecurity: No Food Insecurity (01/19/2022)    Hunger Vital Sign    Worried About Running Out of Food in the Last Year: Never true    Ran Out of Food in the Last Year: Never true  Transportation Needs: No Transportation Needs (01/19/2022)   PRAPARE - Hydrologist (Medical): No    Lack of Transportation (Non-Medical): No  Physical Activity: Not on file  Stress: Not on file  Social Connections: Not on file   Additional Social History:   Sleep: Good  Appetite:  Good  Current Medications: Current Facility-Administered Medications  Medication Dose Route Frequency Provider Last Rate Last Admin   acetaminophen (TYLENOL) tablet 650 mg  650 mg Oral Q6H PRN Rozetta Nunnery, NP   650 mg at 01/19/22 1838   alum & mag hydroxide-simeth (MAALOX/MYLANTA) 200-200-20 MG/5ML suspension 30 mL  30 mL Oral Q4H PRN Rozetta Nunnery, NP       hydrOXYzine (ATARAX) tablet 25 mg  25 mg Oral TID PRN Lindon Romp A, NP   25 mg at 01/23/22 2200   magnesium hydroxide (MILK OF MAGNESIA) suspension 30 mL  30 mL Oral Daily PRN Rozetta Nunnery, NP       multivitamin with minerals tablet 1 tablet  1 tablet Oral Daily Nicholes Rough, NP   1 tablet at 01/24/22 4332   neomycin-bacitracin-polymyxin (NEOSPORIN) ointment   Topical Daily Nicholes Rough, NP   Given at 01/23/22 9518   neomycin-polymyxin-pramoxine (NEOSPORIN PLUS) cream   Topical Daily Nicholes Rough, NP       nicotine polacrilex (NICORETTE) gum 2 mg  2 mg Oral PRN Massengill, Ovid Curd, MD       sertraline (ZOLOFT) tablet 150 mg  150 mg Oral Daily Nicholes Rough, NP   150 mg at 01/24/22 0801   thiamine (Vitamin B-1) tablet 100 mg  100 mg Oral Daily Nicholes Rough, NP   100 mg at 01/24/22 0801   thiamine (VITAMIN B1) injection 100 mg  100 mg Intramuscular Once Nicholes Rough, NP       traZODone (DESYREL) tablet 50 mg  50 mg Oral QHS PRN Lindon Romp A, NP   50 mg at 01/23/22 2200    Lab Results:  No results found for this or any previous visit (from the past 48 hour(s)).   Blood  Alcohol level:  Lab Results  Component Value Date   ETH <10 01/19/2022   ETH <10 84/16/6063    Metabolic Disorder Labs: Lab Results  Component Value Date   HGBA1C 5.1 01/20/2022   MPG 99.67 01/20/2022   No results found for: "PROLACTIN" Lab Results  Component Value Date   CHOL 196 01/20/2022   TRIG 140 01/20/2022   HDL 37 (L) 01/20/2022   CHOLHDL 5.3 01/20/2022   VLDL 28 01/20/2022   LDLCALC 131 (H) 01/20/2022    Physical Findings: AIMS: Facial and Oral Movements Muscles of Facial Expression: None, normal Lips and Perioral Area: None, normal Jaw: None,  normal Tongue: None, normal,Extremity Movements Upper (arms, wrists, hands, fingers): None, normal Lower (legs, knees, ankles, toes): None, normal, Trunk Movements Neck, shoulders, hips: None, normal, Overall Severity Severity of abnormal movements (highest score from questions above): None, normal Incapacitation due to abnormal movements: None, normal Patient's awareness of abnormal movements (rate only patient's report): No Awareness, Dental Status Current problems with teeth and/or dentures?: No Does patient usually wear dentures?: No  CIWA:  CIWA-Ar Total: 0 COWS: n/a AIMS:0    Musculoskeletal: Strength & Muscle Tone: within normal limits Gait & Station: normal Patient leans: N/A  Psychiatric Specialty Exam:  Presentation  General Appearance:  Appropriate for Environment; Fairly Groomed  Eye Contact: Fair  Speech: Clear and Coherent  Speech Volume: Normal  Handedness: Right   Mood and Affect  Mood: Depressed  Affect: Congruent   Thought Process  Thought Processes: Coherent  Descriptions of Associations:Intact  Orientation:Full (Time, Place and Person)  Thought Content:Logical  History of Schizophrenia/Schizoaffective disorder:No  Duration of Psychotic Symptoms:No data recorded Hallucinations:Hallucinations: None  Ideas of Reference:None  Suicidal Thoughts:Suicidal Thoughts:  No  Homicidal Thoughts:Homicidal Thoughts: No   Sensorium  Memory: Immediate Good  Judgment: Fair  Insight: Fair   Art therapist  Concentration: Good  Attention Span: Good  Recall: Good  Fund of Knowledge: Good  Language: Good  Psychomotor Activity  Psychomotor Activity: Psychomotor Activity: Normal  Assets  Assets: Communication Skills  Sleep  Sleep: Sleep: Good  Physical Exam: Physical Exam Constitutional:      Appearance: Normal appearance.  HENT:     Head: Normocephalic.     Nose: Nose normal. No congestion or rhinorrhea.  Eyes:     Pupils: Pupils are equal, round, and reactive to light.  Pulmonary:     Effort: Pulmonary effort is normal.  Musculoskeletal:     Cervical back: Normal range of motion.  Neurological:     General: No focal deficit present.     Mental Status: He is alert and oriented to person, place, and time.     Sensory: No sensory deficit.     Coordination: Coordination normal.  Psychiatric:        Behavior: Behavior normal.    Review of Systems  Constitutional:  Negative for fever.  HENT:  Negative for sore throat.   Respiratory:  Negative for cough.   Cardiovascular:  Negative for chest pain.  Gastrointestinal:  Negative for heartburn.  Skin:  Negative for rash.  Neurological:  Negative for dizziness and headaches.  Psychiatric/Behavioral:  Positive for depression and substance abuse. Negative for hallucinations, memory loss and suicidal ideas. The patient has insomnia. The patient is not nervous/anxious.    Blood pressure 125/75, pulse (!) 55, temperature 98.3 F (36.8 C), temperature source Oral, resp. rate 18, height 5\' 8"  (1.727 m), weight 72.6 kg, SpO2 99 %. Body mass index is 24.33 kg/m.  Treatment Plan Summary:  Treatment Plan Summary: Daily contact with patient to assess and evaluate symptoms and progress in treatment and Medication management   Observation Level/Precautions:  15 minute checks   Laboratory:  Labs reviewed   Psychotherapy:  Unit Group sessions  Medications:  See [name]  Consultations:  To be determined   Discharge Concerns:  Safety, medication compliance, mood stability  Estimated LOS: 5-7 days  Other:  N/A    PLAN Safety and Monitoring: Voluntary admission to inpatient psychiatric unit for safety, stabilization and treatment Daily contact with patient to assess and evaluate symptoms and progress in treatment Patient's case to be discussed  in multi-disciplinary team meeting Observation Level : q15 minute checks Vital signs: q12 hours Precautions: Suicide   Long Term Goal(s): Improvement in symptoms so as ready for discharge   Short Term Goals: Ability to identify changes in lifestyle to reduce recurrence of condition will improve, Ability to verbalize feelings will improve, Ability to disclose and discuss suicidal ideas, Ability to demonstrate self-control will improve, Ability to identify and develop effective coping behaviors will improve, Compliance with prescribed medications will improve, and Ability to identify triggers associated with substance abuse/mental health issues will improve   Diagnoses  Principal Problem:   MDD (major depressive disorder), recurrent severe, without psychosis (HCC) Active Problems:   Alcohol use disorder   Delta-9-tetrahydrocannabinol (THC) dependence (HCC)   Cocaine use disorder (HCC)   Severe recurrent major depressive disorder without psychotic features (HCC) -Continue  Zoloft 150 mg -Pt educated on benefits, rationales and possible side effects of this medication including the blackbox warning of the increased risk of SI when SSRIs are first initiated and is agreeable to trials.   Anxiety -Continue Hydroxyzine 25 mg every 6 hours PRN   Insomnia -Continue Trazodone 50 mg nightly PRN for sleep   Nicotine addiction -Continue Nicorette gum PRN   Alcohol use d/o Monitor for signs of withdrawal -Continue Ativan 1mg  tabs  every 6 hours PRN for CIWA >10  -Oral thiamine and MVI replacement -Abstinence from substances encouraged -SW to look into options for outpatient SA treatment at discharge    Other PRNS -Continue Tylenol 650 mg every 6 hours PRN for mild pain -Continue Maalox 30 mg every 4 hrs PRN for indigestion -Continue Imodium 2-4 mg as needed for diarrhea -Continue Milk of Magnesia as needed every 6 hrs for constipation -Continue Zofran disintegrating tabs every 6 hrs PRN for nausea    Discharge Planning: Social work and case management to assist with discharge planning and identification of hospital follow-up needs prior to discharge Estimated LOS: 5-7 days Discharge Concerns: Need to establish a safety plan; Medication compliance and effectiveness Discharge Goals: Return home with outpatient referrals for mental health follow-up including medication management/psychotherapy , NP 01/24/2022, 3:51 PMPatient ID: 01/26/2022, male   DOB: 1996/01/11, 26 y.o.   MRN: [id number] Patient ID: Kaire Stary, male   DOB: November 23, 1995, 26 y.o.   MRN: [id number] Patient ID: Sheryl Towell, male   DOB: Jun 26, 1995, 26 y.o.   MRN: [id number]

## 2022-01-24 NOTE — [facility] Suicide Risk Assessment (Signed)
Suicide Risk Assessment  Discharge Assessment    [facility] Discharge Suicide Risk Assessment   Principal Problem: MDD (major depressive disorder), recurrent severe, without psychosis (HCC) Discharge Diagnoses: Principal Problem:   MDD (major depressive disorder), recurrent severe, without psychosis (HCC) Active Problems:   Alcohol use disorder   Delta-9-tetrahydrocannabinol (THC) dependence (HCC)   Cocaine use disorder (HCC)  Reason for admission: Jonathan [name] is a 26 year old African-American male with a prior mental health history of MDD who was taken to the [facility] ED by EMS after a suicide attempt in which he made cuts to his left wrist, and called 911 in the context of socioeconomic stressors & polysubstance abuse.  Patient was involuntarily transferred to this [facility] for treatment and stabilization of his mood.                                                    HOSPITAL COURSE During the patient's hospitalization, patient had extensive initial psychiatric evaluation, and follow-up psychiatric evaluations every day. Psychiatric diagnoses provided upon initial assessment were as follows: Principal Problem:   MDD (major depressive disorder), recurrent severe, without psychosis (HCC) Active Problems:   Alcohol use disorder   Delta-9-tetrahydrocannabinol (THC) dependence (HCC)   Cocaine use disorder (HCC)  Patient's psychiatric medications were adjusted on admission as follows: Severe recurrent major depressive disorder without psychotic features (HCC) -Started Zoloft 50 mg daily and increase to 100 mg on 01/21/22-Pt educated on benefits, rationales and possible side effects of this medication and is agreeable to trials.   Anxiety -Started Hydroxyzine 25 mg every 6 hours PRN   Insomnia -Started Trazodone 50 mg nightly PRN for sleep   Nicotine addiction -Started Nicorette gum PRN   Alcohol use d/o Monitored for signs of withdrawal -Started Ativan 1mg  tabs every 6 hours PRN  for CIWA >10  -Started Oral thiamine and MVI replacement  During the hospitalization, other adjustments were made to the patient's psychiatric medication regimen, with medications at discharge being as follows: -Continue  Zoloft 150 mg -Pt educated on benefits, rationales and possible side effects of this medication including the blackbox warning of the increased risk of SI when SSRIs are first initiated and is agreeable to trials.   -Continue Hydroxyzine 25 mg every 6 hours PRN for anxiety   -Continue Trazodone 50 mg nightly PRN for sleep   -Continue Nicorette gum PRN for nicotine dependence   Patient's care was discussed during the interdisciplinary team meeting every day during the hospitalization. The patient denies having side effects to prescribed psychiatric medication. Gradually, patient started adjusting to milieu. The patient was evaluated each day by a clinical provider to ascertain response to treatment. Improvement was noted by the patient's report of decreasing symptoms, improved sleep and appetite, affect, medication tolerance, behavior, and participation in unit programming.  Patient was asked each day to complete a self inventory noting mood, mental status, pain, new symptoms, anxiety and concerns.    Symptoms were reported as significantly decreased or resolved completely by discharge. A day prior to day of discharge, the patient reports that their mood is stable. The patient denied having suicidal thoughts for more than 48 hours prior to discharge.  Patient denies having homicidal thoughts.  Patient denies having auditory hallucinations.  Patient denies any visual hallucinations or other symptoms of psychosis. The patient was motivated to continue taking  medication with a goal of continued improvement in mental health.   The patient reports their target psychiatric symptoms of depression, anxiety and insomnia responded well to the psychiatric medications, and the patient reports  overall benefit other psychiatric hospitalization. Supportive psychotherapy was provided to the patient. The patient also participated in regular group therapy while hospitalized. Coping skills, problem solving as well as relaxation therapies were also part of the unit programming.  Labs were reviewed with the patient, and abnormal results were discussed with the patient.  Total Time spent with patient: 30 minutes  Musculoskeletal: Strength & Muscle Tone: within normal limits Gait & Station: normal Patient leans: N/A  Psychiatric Specialty Exam  Presentation  General Appearance:  Appropriate for Environment; Fairly Groomed  Eye Contact: Fair  Speech: Clear and Coherent  Speech Volume: Normal  Handedness: Right   Mood and Affect  Mood: Depressed  Duration of Depression Symptoms: Greater than two weeks  Affect: Congruent   Thought Process  Thought Processes: Coherent  Descriptions of Associations:Intact  Orientation:Full (Time, Place and Person)  Thought Content:Logical  History of Schizophrenia/Schizoaffective disorder:No  Duration of Psychotic Symptoms:No data recorded Hallucinations:Hallucinations: None  Ideas of Reference:None  Suicidal Thoughts:Suicidal Thoughts: No  Homicidal Thoughts:Homicidal Thoughts: No   Sensorium  Memory: Immediate Good  Judgment: Fair  Insight: Fair   Community education officer  Concentration: Good  Attention Span: Good  Recall: Good  Fund of Knowledge: Good  Language: Good   Psychomotor Activity  Psychomotor Activity: Psychomotor Activity: Normal   Assets  Assets: Communication Skills   Sleep  Sleep: Sleep: Good   Physical Exam: Physical Exam Constitutional:      Appearance: Normal appearance.  HENT:     Head: Normocephalic.     Nose: Nose normal.  Eyes:     Pupils: Pupils are equal, round, and reactive to light.  Pulmonary:     Effort: Pulmonary effort is normal.   Musculoskeletal:        General: Normal range of motion.     Cervical back: Normal range of motion.  Neurological:     Mental Status: He is alert and oriented to person, place, and time.  Psychiatric:        Behavior: Behavior normal.        Thought Content: Thought content normal.    Review of Systems  Constitutional:  Negative for fever.  HENT: Negative.  Negative for sore throat.   Eyes: Negative.   Respiratory: Negative.    Cardiovascular: Negative.   Gastrointestinal: Negative.   Genitourinary: Negative.   Musculoskeletal: Negative.   Skin: Negative.  Negative for rash.  Neurological: Negative.   Psychiatric/Behavioral:  Positive for depression (denies SI, denies HI and denies AVH, verbally conytracts for safety outside [facility]) and substance abuse (educated on cessation). Negative for hallucinations, memory loss and suicidal ideas. The patient is not nervous/anxious (resolved on current medications) and does not have insomnia (resolved on current medications).    Blood pressure 118/76, pulse (!) 58, temperature 98.3 F (36.8 C), temperature source Oral, resp. rate 18, height 5\' 8"  (1.727 m), weight 72.6 kg, SpO2 99 %. Body mass index is 24.33 kg/m.  Mental Status Per Nursing Assessment::   On Admission:  Suicidal ideation indicated by patient  Demographic Factors:  Male  Loss Factors: Financial problems/change in socioeconomic status  Historical Factors: Impulsivity  Risk Reduction Factors:   Living with another person, especially a relative  Continued Clinical Symptoms:  Patient reports that his depressive symptoms have significantly reduced since  hospitalization. He denies SI, denies HI, denies AVH, denies paranoia and verbally contracts for safety after discharge from this hospital.  Cognitive Features That Contribute To Risk:  None    Suicide Risk:  Mild:  There are no identifiable suicide plans, no associated intent, mild dysphoria and related symptoms, good  self-control (both objective and subjective assessment), few other risk factors, and identifiable protective factors, including available and accessible social support.    Follow-up Information     [facility] Follow up.   Specialty: Behavioral Health Why: After completion of the PHP program, please schedule an assessment, to obtain therapy and medication management services; on a Monday or a Wednesday morning, arrive by 7:30 am.  Services are provided on a first come, first served basis.  Once you have scheduled and completed your assessment, appointments will be scheduled for therapy and medication management. Contact information: [address] [place] [address] [phone]        [facility] Follow up on 01/30/2022.   Specialty: Behavioral Health Why: You are scheduled for an appointment for the Partial Hospitalization Program on 01/30/22 at 10:00am. This appointment will last approximately one hour and will be virtual via Webex. You will receive an email invite the day of the appointment. PHP is virtual group therapy that runs Mon-Fri from 9am-1pm. Please download the [employer] app prior to the appointment. If you need to cancel or reschedule, please call [phone] Contact information: [address] [place] [address] [phone]               Plan Of Care/Follow-up recommendations:  The patient is able to verbalize their individual safety plan to this provider.  # It is recommended to the patient to continue psychiatric medications as prescribed, after discharge from the hospital.    # It is recommended to the patient to follow up with your outpatient psychiatric provider and PCP.  # It was discussed with the patient, the impact of alcohol, drugs, tobacco have been there overall psychiatric and medical wellbeing, and total abstinence from substance use was recommended the patient.ed.  #  Prescriptions provided or sent directly to preferred pharmacy at discharge. Patient agreeable to plan. Given opportunity to ask questions. Appears to feel comfortable with discharge.    # In the event of worsening symptoms, the patient is instructed to call the crisis hotline (988), 911 and or go to the nearest ED for appropriate evaluation and treatment of symptoms. To follow-up with primary care provider for other medical issues, concerns and or health care needs  # Patient was discharged home with a plan to follow up as noted above.   Starleen Blue, NP 01/24/2022, 5:54 PM

## 2022-01-24 NOTE — Progress Notes (Signed)
Patient did not attend morning orientation group.  

## 2022-01-24 NOTE — Progress Notes (Signed)
Adult Psychoeducational Group Note  Date:  01/24/2022 Time:  9:53 PM  Group Topic/Focus:  Wrap-Up Group:   The focus of this group is to help patients review their daily goal of treatment and discuss progress on daily workbooks.  Participation Level:  Active  Participation Quality:  Appropriate  Affect:  Appropriate  Cognitive:  Appropriate  Insight: Appropriate  Engagement in Group:  Engaged  Modes of Intervention:  Discussion  Additional Comments:  Pt stated his day was okay.  Pt stated his goal for the day was to not sleep as much and attend groups.  Pt met goal.  Elise Benne 01/24/2022, 9:53 PM

## 2022-01-24 NOTE — BHH Suicide Risk Assessment (Signed)
[place] INPATIENT:  Family/Significant Other Suicide Prevention Education  Suicide Prevention Education:  Education Completed; Jonathan [name] [phone] (Sister) has been identified by the patient as the family member/significant other with whom the patient will be residing, and identified as the person(s) who will aid the patient in the event of a mental health crisis (suicidal ideations/suicide attempt).  With written consent from the patient, the family member/significant other has been provided the following suicide prevention education, prior to the and/or following the discharge of the patient.  The suicide prevention education provided includes the following: Suicide risk factors Suicide prevention and interventions National Suicide Hotline telephone number [place] [facility] assessment telephone number [facility] Emergency Assistance [place] and/or Residential Mobile Crisis Unit telephone number  Request made of family/significant other to: Remove weapons (e.g., guns, rifles, knives), all items previously/currently identified as safety concern.   Remove drugs/medications (over-the-counter, prescriptions, illicit drugs), all items previously/currently identified as a safety concern.  The family member/significant other verbalizes understanding of the suicide prevention education information provided.  The family member/significant other agrees to remove the items of safety concern listed above.  CSW spoke Jonathan [name] who states that her brother has been dealing with some depression recently. She confirms that upon discharge he can return to the home.  She states that there are no firearms or weapons in the home.  She states that his girlfriend will likely pick him up at discharge.  Mrs. Jonathan [name] states that she has additional questions or concerns at this time.  CSW completed SPE with Jonathan [name].   Jonathan [name] 01/24/2022, 3:26 PM

## 2022-01-24 NOTE — BHH Group Notes (Signed)
Pt attended MHT group 

## 2022-01-24 NOTE — Progress Notes (Signed)
Patient came to the nurse's station reporting that he "spit up blood" this morning. Patient described the blood as "light red and yellow." Upon physical assessment, the back molar on the right side appeared to have dried blood on it. The back of the throat was pink and moist without any appearance of blood.

## 2022-01-25 NOTE — Progress Notes (Signed)
   01/25/22 0542  Sleep  Number of Hours 7.25

## 2022-01-25 NOTE — Progress Notes (Signed)
D:  Patient denied SI and HI, contracts for safety.  Denied A/V hallucinations.  Denied pain. A:  Medications administered per MD orders.  Emotional support and encouragement given patient. R:  Safety maintained with 15 minute checks.  

## 2022-01-25 NOTE — Progress Notes (Signed)
Discharge Note:  Patient discharged to be picked up by girlfriend and taken to his sister's home.  Suicide prevention information given and discussed with patient who stated he understood.  Patient denied SI and HI.  Denied A/V hallucinations.  Patient stated he appreciated all assistance received from [facility] staff.Patient stated he received all his clothing, toiletries, misc items, etc.  Patient stated he appreciated all assistance received from [facility] staff.  All discharge information given patient.

## 2022-01-25 NOTE — Progress Notes (Signed)
D- Patient alert and oriented. Denies SI, HI, AVH, and pain. Patient rates anxiety and depression at a 0/10 for each. Patient showered today but kept the bandage on his left arm during. The patient later complained of itching in that area.  A- Dressing changed on left arm. Wounds were assessed and found to be clean and dry and without odor or redness. Support and encouragement provided.  Routine safety checks conducted every 15 minutes.  Patient informed to notify staff with problems or concerns.  R- Patient reports that itching was alleviated by bandage change. Patient contracts for safety at this time. Patient compliant with medications and treatment plan. Patient receptive, calm, and cooperative. Patient interacts well with others on the unit.  Patient remains safe at this time.

## 2022-01-25 NOTE — Plan of Care (Signed)
Nurse discussed anxiety, depression and coping skills with patient.  

## 2022-01-29 NOTE — Discharge Summary (Signed)
Physician Discharge Summary Note  Patient:  Jonathan [name] is an 26 y.o., male MRN:  [id number] DOB:  1995-11-05 Patient phone:  [phone] (home)  Patient address:   [place] [id number],  Total Time spent with patient: 20 minutes  Date of Admission:  01/19/2022 Date of Discharge: 01-24-2022  Reason for Admission:    Jonathan [name] is a 26 year old African-American male with a prior mental health history of MDD who was taken to the [facility] ED by EMS after a suicide attempt in which he made cuts to his left wrist, and called 911 in the context of socioeconomic stressors & polysubstance abuse.  Patient was involuntarily transferred to this [facility] for treatment and stabilization of his mood.       Principal Problem: MDD (major depressive disorder), recurrent severe, without psychosis ([place]) Discharge Diagnoses: Principal Problem:   MDD (major depressive disorder), recurrent severe, without psychosis ([place]) Active Problems:   Alcohol use disorder   Delta-9-tetrahydrocannabinol (THC) dependence ([place])   Cocaine use disorder [facility])   Past Psychiatric History:  Patient reports 2 prior mental health related hospitalizations at this hospital.  As per chart review, patient was hospitalized at this hospital from 08/28/2015 through 08/31/2015.  For that hospitalization, his diagnosis was MDD without psychotic features.  For that hospitalization, patient had a similar presentation and made cuts to his forearms after an argument with his girlfriend.  During that hospitalization, he reported that his stressors were the failing health of his mother and grandmother.   Patient was again admitted to this hospital from 05/22/2017 through 05/27/2017.  During that hospitalization, patient reported that he felt down and depressed and suicidal with a plan to cut his arms due to the death of his grandmother in 05/01/2022 of that year.  Patient also shared during that hospitalization that he had  been self-injuring to relieve stress.   Patient reports a history of manic-type symptoms that last 2 to 3 days; he reports hypersexuality during those times, reports increased energy levels during this period, and reports a decrease in his appetite and not feeling hungry, not feeling tired and functioning on a very little sleep during those times. Upon further questioning, patient reports that the manic type behaviors only happened in the context of cocaine use.  Past diagnosis of bipolar disorder is therefore questionable, as patient reports that he has never experienced these manic type symptoms in the absence of cocaine use.   Substance use history:  During this assessment, patient was not entirely forthcoming regarding his substance use until he was confronted about his tox screen being positive for cocaine and marijuana.  Patient initially denied use of both substances, but later shared that he started cocaine use when he was 26 years old, and he uses it every day, using as much as he can get on a daily basis.  He is unable to quantify how much cocaine he uses in a day.  He reports that he only uses marijuana a few times in a month.  He reports that he started using [facility] when he was 26 years old.   Patient also reports that he uses alcohol and that his alcohol habit started when he was 27 years old.  He shares that he has a history of using liquor but currently does not use liquor.  He reports that he drinks 2 beers which consist of 24 ounces each every other day.  Patient reports that his last drink was the night prior to presentation  to the ER.  He denies any history of blackouts related to alcohol use, and denies any history of seizures related to alcohol withdrawals.  Tox screen had a BAL of less than 10.   Patient reports that he currently smokes cigarettes daily but is not interested in a nicotine patch.  He has been educated that one will be ordered for him, just in case he needs it.  Patient  denies any other recreational drug use other than the ones mentioned above.   Past psychiatric medication history: Patient reports that he is unable to recall past psychotropic medications. During the hospitalization that patient had in July 29, 2015, he was prescribed citalopram 10 mg daily for depressive symptoms, and Depakote 500 mg for mood stabilization.  Trazodone 50 mg nightly was ordered for sleep.  These medications remained unchanged during the rehospitalization in 07-28-2017.  There was an increase in dose to citalopram 20 mg and Depakote was adjusted to 750 mg prior to that discharge.  Patient shares that after the hospitalization in 2017-07-28, he stopped taking his medications 2 weeks after being discharged, and also did not follow-up with his outpatient appointments.   Family history:  Patient reports a history of crack cocaine addiction in his biological mother.  He denies any other mental health conditions in his family.  He denies any completed suicides in his family.  He reports that his mother died in 07/28/2017, but that she was sick and reports not feeling comfortable disclosing her cause of death.   Past Medical History: Denies any current or past medical conditions.  Past Medical History:  Past Medical History:  Diagnosis Date   Bipolar 1 disorder ([place])    History reviewed. No pertinent surgical history. Family History: History reviewed. No pertinent family history.  Social History:  Social History   Substance and Sexual Activity  Alcohol Use Yes   Alcohol/week: 2.0 standard drinks of alcohol   Types: 2 Cans of beer per week     Social History   Substance and Sexual Activity  Drug Use Yes   Types: Marijuana    Social History   Socioeconomic History   Marital status: Single    Spouse name: Not on file   Number of children: Not on file   Years of education: Not on file   Highest education level: Not on file  Occupational History   Not on file  Tobacco Use   Smoking status: Every  Day    Packs/day: 1.00    Types: Cigarettes   Smokeless tobacco: Never  Substance and Sexual Activity   Alcohol use: Yes    Alcohol/week: 2.0 standard drinks of alcohol    Types: 2 Cans of beer per week   Drug use: Yes    Types: Marijuana   Sexual activity: Yes    Birth control/protection: Condom  Other Topics Concern   Not on file  Social History Narrative   ** Merged History Encounter **       Social Determinants of Health   Financial Resource Strain: Not on file  Food Insecurity: No Food Insecurity (01/19/2022)   Hunger Vital Sign    Worried About Running Out of Food in the Last Year: Never true    Ran Out of Food in the Last Year: Never true  Transportation Needs: No Transportation Needs (01/19/2022)   PRAPARE - Hydrologist (Medical): No    Lack of Transportation (Non-Medical): No  Physical Activity: Not on file  Stress: Not  on file  Social Connections: Not on file    Hospital Course:     During the patient's hospitalization, patient had extensive initial psychiatric evaluation, and follow-up psychiatric evaluations every day. Psychiatric diagnoses provided upon initial assessment were as follows: Principal Problem:   MDD (major depressive disorder), recurrent severe, without psychosis ([place]) Active Problems:   Alcohol use disorder   Delta-9-tetrahydrocannabinol (THC) dependence ([place])   Cocaine use disorder ([place])   Patient's psychiatric medications were adjusted on admission as follows: Severe recurrent major depressive disorder without psychotic features ([place]) -Started Zoloft 50 mg daily and increase to 100 mg on 01/21/22-Pt educated on benefits, rationales and possible side effects of this medication and is agreeable to trials.   Anxiety -Started Hydroxyzine 25 mg every 6 hours PRN   Insomnia -Started Trazodone 50 mg nightly PRN for sleep   Nicotine addiction -Started Nicorette gum PRN   Alcohol use d/o Monitored for signs  of withdrawal -Started Ativan 1mg  tabs every 6 hours PRN for CIWA >10  -Started Oral thiamine and MVI replacement   During the hospitalization, other adjustments were made to the patient's psychiatric medication regimen, with medications at discharge being as follows: -Continue  Zoloft 150 mg -Pt educated on benefits, rationales and possible side effects of this medication including the blackbox warning of the increased risk of SI when SSRIs are first initiated and is agreeable to trials.   -Continue Hydroxyzine 25 mg every 6 hours PRN for anxiety   -Continue Trazodone 50 mg nightly PRN for sleep   -Continue Nicorette gum PRN for nicotine dependence   Patient's care was discussed during the interdisciplinary team meeting every day during the hospitalization. The patient denies having side effects to prescribed psychiatric medication. Gradually, patient started adjusting to milieu. The patient was evaluated each day by a clinical provider to ascertain response to treatment. Improvement was noted by the patient's report of decreasing symptoms, improved sleep and appetite, affect, medication tolerance, behavior, and participation in unit programming.  Patient was asked each day to complete a self inventory noting mood, mental status, pain, new symptoms, anxiety and concerns.     Symptoms were reported as significantly decreased or resolved completely by discharge. A day prior to day of discharge, the patient reports that their mood is stable. The patient denied having suicidal thoughts for more than 48 hours prior to discharge.  Patient denies having homicidal thoughts.  Patient denies having auditory hallucinations.  Patient denies any visual hallucinations or other symptoms of psychosis. The patient was motivated to continue taking medication with a goal of continued improvement in mental health.    The patient reports their target psychiatric symptoms of depression, anxiety and insomnia responded  well to the psychiatric medications, and the patient reports overall benefit other psychiatric hospitalization. Supportive psychotherapy was provided to the patient. The patient also participated in regular group therapy while hospitalized. Coping skills, problem solving as well as relaxation therapies were also part of the unit programming.   Labs were reviewed with the patient, and abnormal results were discussed with the patient.   Physical Findings: AIMS: Facial and Oral Movements Muscles of Facial Expression: None, normal Lips and Perioral Area: None, normal Jaw: None, normal Tongue: None, normal,Extremity Movements Upper (arms, wrists, hands, fingers): None, normal Lower (legs, knees, ankles, toes): None, normal, Trunk Movements Neck, shoulders, hips: None, normal, Overall Severity Severity of abnormal movements (highest score from questions above): None, normal Incapacitation due to abnormal movements: None, normal Patient's awareness of abnormal movements (  rate only patient's report): No Awareness, Dental Status Current problems with teeth and/or dentures?: No Does patient usually wear dentures?: No  CIWA:  CIWA-Ar Total: 0 COWS:     Musculoskeletal: Strength & Muscle Tone: within normal limits Gait & Station: normal Patient leans: N/A  Aims score zero on my exam. No eps on my exam.  Psychiatric Specialty Exam   Presentation  General Appearance:  Appropriate for Environment; Fairly Groomed   Eye Contact: Fair   Speech: Clear and Coherent   Speech Volume: Normal   Handedness: Right     Mood and Affect  Mood: Depressed   Duration of Depression Symptoms: Greater than two weeks   Affect: Congruent     Thought Process  Thought Processes: Coherent   Descriptions of Associations:Intact   Orientation:Full (Time, Place and Person)   Thought Content:Logical   History of Schizophrenia/Schizoaffective disorder:No   Duration of Psychotic Symptoms:No  data recorded Hallucinations:Hallucinations: None   Ideas of Reference:None   Suicidal Thoughts:Suicidal Thoughts: No   Homicidal Thoughts:Homicidal Thoughts: No     Sensorium  Memory: Immediate Good   Judgment: Fair   Insight: Fair     Community education officer  Concentration: Good   Attention Span: Good   Recall: Good   Fund of Knowledge: Good   Language: Good     Psychomotor Activity  Psychomotor Activity: Psychomotor Activity: Normal     Assets  Assets: Communication Skills     Sleep  Sleep: Sleep: Good     Physical Exam: Physical Exam Constitutional:      Appearance: Normal appearance.  HENT:     Head: Normocephalic.     Nose: Nose normal.  Eyes:     Pupils: Pupils are equal, round, and reactive to light.  Pulmonary:     Effort: Pulmonary effort is normal.  Musculoskeletal:        General: Normal range of motion.     Cervical back: Normal range of motion.  Neurological:     Mental Status: He is alert and oriented to person, place, and time.  Psychiatric:        Behavior: Behavior normal.        Thought Content: Thought content normal.      Review of Systems  Constitutional:  Negative for fever.  HENT: Negative.  Negative for sore throat.   Eyes: Negative.   Respiratory: Negative.    Cardiovascular: Negative.   Gastrointestinal: Negative.   Genitourinary: Negative.   Musculoskeletal: Negative.   Skin: Negative.  Negative for rash.  Neurological: Negative.   Psychiatric/Behavioral:  Positive for depression (denies SI, denies HI and denies AVH, verbally conytracts for safety outside [facility]) and substance abuse (educated on cessation). Negative for hallucinations, memory loss and suicidal ideas. The patient is not nervous/anxious (resolved on current medications) and does not have insomnia (resolved on current medications).         Blood pressure 114/83, pulse 68, temperature 98 F (36.7 C), temperature source Oral, resp. rate 18,  height 5\' 8"  (1.727 m), weight 72.6 kg, SpO2 97 %. Body mass index is 24.33 kg/m.   Social History   Tobacco Use  Smoking Status Every Day   Packs/day: 1.00   Types: Cigarettes  Smokeless Tobacco Never   Tobacco Cessation:  A prescription for an FDA-approved tobacco cessation medication provided at discharge   Blood Alcohol level:  Lab Results  Component Value Date   [facility] <10 01/19/2022   ETH <10 [id number]    Metabolic Disorder Labs:  Lab Results  Component Value Date   HGBA1C 5.1 01/20/2022   MPG 99.67 01/20/2022   No results found for: "PROLACTIN" Lab Results  Component Value Date   CHOL 196 01/20/2022   TRIG 140 01/20/2022   HDL 37 (L) 01/20/2022   CHOLHDL 5.3 01/20/2022   VLDL 28 01/20/2022   LDLCALC 131 (H) 01/20/2022    See Psychiatric Specialty Exam and Suicide Risk Assessment completed by Attending Physician prior to discharge.  Discharge destination:  Home  Is patient on multiple antipsychotic therapies at discharge:  No   Has Patient had three or more failed trials of antipsychotic monotherapy by history:  No  Recommended Plan for Multiple Antipsychotic Therapies: NA  Discharge Instructions     Diet - low sodium heart healthy   Complete by: As directed    Increase activity slowly   Complete by: As directed       Allergies as of 01/25/2022   No Known Allergies      Medication List     STOP taking these medications    ibuprofen 800 MG tablet Commonly known as: ADVIL       TAKE these medications      Indication  hydrOXYzine 25 MG tablet Commonly known as: ATARAX Take 1 tablet (25 mg total) by mouth 3 (three) times daily as needed for anxiety.  Indication: Feeling Anxious   multivitamin with minerals Tabs tablet Take 1 tablet by mouth daily.  Indication: nutrional support   nicotine polacrilex 2 MG gum Commonly known as: NICORETTE Take 1 each (2 mg total) by mouth as needed for smoking cessation.  Indication: Nicotine  Addiction   sertraline 50 MG tablet Commonly known as: ZOLOFT Take 3 tablets (150 mg total) by mouth daily.  Indication: Major Depressive Disorder   traZODone 50 MG tablet Commonly known as: DESYREL Take 1 tablet (50 mg total) by mouth at bedtime as needed for sleep.  Indication: [place] Follow up.   Specialty: Behavioral Health Why: After completion of the PHP program, please schedule an assessment, to obtain therapy and medication management services; on a Monday or a Wednesday morning, arrive by 7:30 am.  Services are provided on a first come, first served basis.  Once you have scheduled and completed your assessment, appointments will be scheduled for therapy and medication management. Contact information: [address] [place] Follow up on 01/30/2022.   Specialty: Behavioral Health Why: You are scheduled for an appointment for the Partial Hospitalization Program on 01/30/22 at 10:00am. This appointment will last approximately one hour and will be virtual via Webex. You will receive an email invite the day of the appointment. PHP is virtual group therapy that runs Mon-Fri from 9am-1pm. Please download the [employer] app prior to the appointment. If you need to cancel or reschedule, please call [phone] Contact information: [place]                Follow-up recommendations:    The patient is able to verbalize their individual safety plan to this provider.   # It is recommended to the patient to continue psychiatric medications as prescribed, after discharge from the hospital.     # It is recommended to the patient to follow up with your outpatient psychiatric provider  and PCP.   # It was discussed with the patient, the impact of alcohol, drugs,  tobacco have been there overall psychiatric and medical wellbeing, and total abstinence from substance use was recommended the patient.ed.   # Prescriptions provided or sent directly to preferred pharmacy at discharge. Patient agreeable to plan. Given opportunity to ask questions. Appears to feel comfortable with discharge.    # In the event of worsening symptoms, the patient is instructed to call the crisis hotline (988), 911 and or go to the nearest ED for appropriate evaluation and treatment of symptoms. To follow-up with primary care provider for other medical issues, concerns and or health care needs   # Patient was discharged home with a plan to follow up as noted above.     Signed: Christoper Allegra, MD 01/29/2022, 3:03 PM  Total Time Spent in Direct Patient Care:  I personally spent 35 minutes on the unit in direct patient care. The direct patient care time included face-to-face time with the patient, reviewing the patient's chart, communicating with other professionals, and coordinating care. Greater than 50% of this time was spent in counseling or coordinating care with the patient regarding goals of hospitalization, psycho-education, and discharge planning needs.   Janine Limbo, MD Psychiatrist

## 2022-01-30 ENCOUNTER — Ambulatory Visit (HOSPITAL_COMMUNITY): Payer: Federal, State, Local not specified - Other

## 2022-01-30 ENCOUNTER — Telehealth (HOSPITAL_COMMUNITY): Payer: Self-pay | Admitting: Licensed Clinical Social Worker

## 2022-01-30 NOTE — Telephone Encounter (Signed)
Cln sent Webex invite at 8:50 am, signed on at 10:00 am, attempted to call pt at 10:05 am, and remained online for PHP CCA until 10:15 am. Pt failed to sign onto Webex.

## 2022-06-20 ENCOUNTER — Emergency Department (HOSPITAL_COMMUNITY)
Admission: EM | Admit: 2022-06-20 | Discharge: 2022-06-20 | Disposition: A | Discharge status: Home / Self Care | Payer: Medicaid Other | Source: Home / Self Care | Attending: Emergency Medicine | Admitting: Emergency Medicine

## 2022-06-20 ENCOUNTER — Emergency Department (HOSPITAL_BASED_OUTPATIENT_CLINIC_OR_DEPARTMENT_OTHER)
Admission: EM | Admit: 2022-06-20 | Discharge: 2022-06-20 | Disposition: A | Discharge status: Home / Self Care | Payer: Medicaid Other | Attending: Emergency Medicine | Admitting: Emergency Medicine

## 2022-06-20 ENCOUNTER — Other Ambulatory Visit: Payer: Self-pay

## 2022-06-20 ENCOUNTER — Encounter (HOSPITAL_BASED_OUTPATIENT_CLINIC_OR_DEPARTMENT_OTHER): Payer: Self-pay

## 2022-06-20 DIAGNOSIS — J020 Streptococcal pharyngitis: Secondary | ICD-10-CM | POA: Insufficient documentation

## 2022-06-20 DIAGNOSIS — R059 Cough, unspecified: Secondary | ICD-10-CM | POA: Diagnosis present

## 2022-06-20 DIAGNOSIS — Z5321 Procedure and treatment not carried out due to patient leaving prior to being seen by health care provider: Secondary | ICD-10-CM | POA: Diagnosis not present

## 2022-06-20 DIAGNOSIS — B950 Streptococcus, group A, as the cause of diseases classified elsewhere: Secondary | ICD-10-CM

## 2022-06-20 DIAGNOSIS — Z1152 Encounter for screening for COVID-19: Secondary | ICD-10-CM | POA: Diagnosis not present

## 2022-06-20 LAB — GROUP A STREP BY PCR: Group A Strep by PCR: DETECTED — AB

## 2022-06-20 LAB — RESP PANEL BY RT-PCR (RSV, FLU A&B, COVID)  RVPGX2
Influenza A by PCR: NEGATIVE
Influenza B by PCR: NEGATIVE
Resp Syncytial Virus by PCR: NEGATIVE
SARS Coronavirus 2 by RT PCR: NEGATIVE

## 2022-06-20 MED ORDER — PENICILLIN V POTASSIUM 500 MG PO TABS: 500.0000 mg | ORAL_TABLET | Freq: Three times a day (TID) | ORAL | 0 refills | Status: DC

## 2022-06-20 NOTE — ED Triage Notes (Signed)
Patient here POV from Home.  Endorses Productive Cough for 2 weeks. No known fevers. No Congestion.  NAD Noted during Triage. A&Ox4. Gcs 15. Ambulatory.

## 2022-06-20 NOTE — Discharge Instructions (Signed)
You were diagnosed today with strep throat.  Please take the prescribed antibiotic medication until it is completed.  Please take over-the-counter medication such as ibuprofen or acetaminophen for fever and pain control.  Follow-up as needed with your primary care provider.

## 2022-06-20 NOTE — ED Triage Notes (Signed)
Pt here from [facility] ED requesting results. C/o cough x 2 weeks. No new complaints.

## 2022-06-20 NOTE — ED Provider Notes (Signed)
[place] EMERGENCY DEPARTMENT AT [facility] Provider Note   CSN: [id number] Arrival date & time: 06/20/22  [id number]     History  No chief complaint on file.   Jonathan [name] is a 27 y.o. male.  Patient presents to the emergency department requesting test results from an emergency department visit from earlier today at [place].  Patient reports just over 1 week of cough, sore throat, upper respiratory symptoms.  Denies fevers at home's.  Past medical history significant for bipolar 1 disorder, cocaine use disorder, THC dependence  HPI     Home Medications Prior to Admission medications   Medication Sig Start Date End Date Taking? Authorizing Provider  penicillin v potassium (VEETID) 500 MG tablet Take 1 tablet (500 mg total) by mouth 3 (three) times daily. 06/20/22  Yes Dorothyann Peng, PA-C  hydrOXYzine (ATARAX) 25 MG tablet Take 1 tablet (25 mg total) by mouth 3 (three) times daily as needed for anxiety. 01/24/22   Nicholes Rough, NP  Multiple Vitamin (MULTIVITAMIN WITH MINERALS) TABS tablet Take 1 tablet by mouth daily. 01/25/22   Nicholes Rough, NP  nicotine polacrilex (NICORETTE) 2 MG gum Take 1 each (2 mg total) by mouth as needed for smoking cessation. 01/24/22   Nicholes Rough, NP  sertraline (ZOLOFT) 50 MG tablet Take 3 tablets (150 mg total) by mouth daily. 01/25/22   Nicholes Rough, NP  traZODone (DESYREL) 50 MG tablet Take 1 tablet (50 mg total) by mouth at bedtime as needed for sleep. 01/24/22   Nicholes Rough, NP      Allergies    Patient has no known allergies.    Review of Systems   Review of Systems  HENT:  Positive for rhinorrhea and sore throat.   Respiratory:  Positive for cough. Negative for shortness of breath.     Physical Exam Updated Vital Signs BP 117/78 (BP Location: Left Arm)   Pulse 90   Temp 99.8 F (37.7 C) (Oral)   Resp 19   SpO2 100%  Physical Exam HENT:     Head: Normocephalic and atraumatic.     Mouth/Throat:      Mouth: Mucous membranes are moist.  Eyes:     Conjunctiva/sclera: Conjunctivae normal.  Pulmonary:     Effort: Pulmonary effort is normal. No respiratory distress.  Musculoskeletal:        General: No signs of injury.     Cervical back: Normal range of motion.  Skin:    General: Skin is dry.  Neurological:     Mental Status: He is alert.  Psychiatric:        Speech: Speech normal.        Behavior: Behavior normal.     ED Results / Procedures / Treatments   Labs (all labs ordered are listed, but only abnormal results are displayed) Labs Reviewed - No data to display  EKG None  Radiology No results found.  Procedures Procedures    Medications Ordered in ED Medications - No data to display  ED Course/ Medical Decision Making/ A&P                             Medical Decision Making  Patient presents with sore throat and upper respiratory symptoms.  Differential diagnosis includes but is not limited to viral infection, group A strep, and others.  I reviewed lab results collected at an earlier visit from today.  Patient was positive for group A  strep.  Respiratory panel was negative.  Plan to discharge patient home on antibiotics for group A strep.  Patient may continue with over-the-counter medicines for supportive care at home.  Return precautions provided.        Final Clinical Impression(s) / ED Diagnoses Final diagnoses:  Group A streptococcal infection    Rx / DC Orders ED Discharge Orders          Ordered    penicillin v potassium (VEETID) 500 MG tablet  3 times daily        06/20/22 1903              Ronny Bacon 06/20/22 1904    Audley Hose, MD 06/20/22 [phone]

## 2022-06-28 ENCOUNTER — Encounter (HOSPITAL_COMMUNITY): Payer: Self-pay

## 2022-06-28 ENCOUNTER — Other Ambulatory Visit: Payer: Self-pay

## 2022-06-28 ENCOUNTER — Emergency Department (HOSPITAL_COMMUNITY)
Admission: EM | Admit: 2022-06-28 | Discharge: 2022-06-28 | Disposition: A | Discharge status: Home / Self Care | Payer: Medicaid Other | Attending: Emergency Medicine | Admitting: Emergency Medicine

## 2022-06-28 DIAGNOSIS — R051 Acute cough: Secondary | ICD-10-CM | POA: Diagnosis not present

## 2022-06-28 DIAGNOSIS — J029 Acute pharyngitis, unspecified: Secondary | ICD-10-CM | POA: Diagnosis not present

## 2022-06-28 DIAGNOSIS — R519 Headache, unspecified: Secondary | ICD-10-CM | POA: Diagnosis not present

## 2022-06-28 DIAGNOSIS — R000 Tachycardia, unspecified: Secondary | ICD-10-CM | POA: Diagnosis not present

## 2022-06-28 DIAGNOSIS — Z79899 Other long term (current) drug therapy: Secondary | ICD-10-CM | POA: Insufficient documentation

## 2022-06-28 MED ORDER — PROCHLORPERAZINE EDISYLATE 10 MG/2ML IJ SOLN
10.0000 mg | Freq: Once | INTRAMUSCULAR | Status: AC
  Administered 2022-06-28: 10 mg via INTRAVENOUS
  Filled 2022-06-28: qty 2

## 2022-06-28 MED ORDER — DIPHENHYDRAMINE HCL 50 MG/ML IJ SOLN
12.5000 mg | Freq: Once | INTRAMUSCULAR | Status: AC
  Administered 2022-06-28: 12.5 mg via INTRAVENOUS
  Filled 2022-06-28: qty 1

## 2022-06-28 MED ORDER — BENZONATATE 100 MG PO CAPS: 100.0000 mg | ORAL_CAPSULE | Freq: Three times a day (TID) | ORAL | 0 refills | Status: DC

## 2022-06-28 MED ORDER — BENZONATATE 100 MG PO CAPS
100.0000 mg | ORAL_CAPSULE | Freq: Once | ORAL | Status: AC
  Administered 2022-06-28: 100 mg via ORAL
  Filled 2022-06-28: qty 1

## 2022-06-28 NOTE — ED Triage Notes (Signed)
Pt came in via POV d/t since he was here for strep last week his Lt side of face hurt & has been coughing since then. Rates generalized facial pain 10/10, denies fevers, A/Ox4.

## 2022-06-28 NOTE — ED Provider Notes (Signed)
[place] Provider Note   CSN: [id number] Arrival date & time: 06/28/22  1342     History  No chief complaint on file.   Jonathan [name] is a 27 y.o. male with a past medical history significant for depression, polysubstance abuse, and alcohol abuse who presents to the ED due to left-sided headache.  Patient recently diagnosed with strep throat on 3/20.  Patient was discharged with penicillin which he has been compliant with.  Patient notes sore throat has improved but he has now developed pain on the left side of his face. No speech changes. No difficulties swallowing. Denies visual changes.  Denies nasal congestion and rhinorrhea.  He admits to a persistent dry cough.  Patient notes he is unable to sleep due to his left-sided facial pain/headache.  No visual changes, speech changes, unilateral weakness.  No recent head injury.  History obtained from patient and past medical records. No interpreter used during encounter.       Home Medications Prior to Admission medications   Medication Sig Start Date End Date Taking? Authorizing Provider  benzonatate (TESSALON) 100 MG capsule Take 1 capsule (100 mg total) by mouth every 8 (eight) hours. 06/28/22  Yes Naomia Lenderman, Druscilla Brownie, PA-C  hydrOXYzine (ATARAX) 25 MG tablet Take 1 tablet (25 mg total) by mouth 3 (three) times daily as needed for anxiety. 01/24/22   Nicholes Rough, NP  Multiple Vitamin (MULTIVITAMIN WITH MINERALS) TABS tablet Take 1 tablet by mouth daily. 01/25/22   Nicholes Rough, NP  nicotine polacrilex (NICORETTE) 2 MG gum Take 1 each (2 mg total) by mouth as needed for smoking cessation. 01/24/22   Nicholes Rough, NP  penicillin v potassium (VEETID) 500 MG tablet Take 1 tablet (500 mg total) by mouth 3 (three) times daily. 06/20/22   Dorothyann Peng, PA-C  sertraline (ZOLOFT) 50 MG tablet Take 3 tablets (150 mg total) by mouth daily. 01/25/22   Nicholes Rough, NP  traZODone (DESYREL)  50 MG tablet Take 1 tablet (50 mg total) by mouth at bedtime as needed for sleep. 01/24/22   Nicholes Rough, NP      Allergies    Patient has no known allergies.    Review of Systems   Review of Systems  Constitutional:  Negative for fever.  HENT:  Positive for sinus pressure and sinus pain. Negative for congestion and sore throat.   Respiratory:  Positive for cough. Negative for shortness of breath.   Cardiovascular:  Negative for chest pain.    Physical Exam Updated Vital Signs BP 115/72   Pulse (!) 101   Temp 98.3 F (36.8 C) (Oral)   Resp 16   SpO2 98%  Physical Exam Vitals and nursing note reviewed.  Constitutional:      General: He is not in acute distress.    Appearance: He is not ill-appearing.  HENT:     Head: Normocephalic.     Comments: TTP throughout left sided maxillary and frontal sinuses.     Mouth/Throat:     Comments: Posterior oropharynx clear and mucous membranes moist, there is mild erythema but no edema or tonsillar exudates, uvula midline, normal phonation, no trismus, tolerating secretions without difficulty. Eyes:     Pupils: Pupils are equal, round, and reactive to light.  Neck:     Comments: No meningismus. Cardiovascular:     Rate and Rhythm: Normal rate and regular rhythm.     Pulses: Normal pulses.     Heart sounds:  Normal heart sounds. No murmur heard.    No friction rub. No gallop.  Pulmonary:     Effort: Pulmonary effort is normal.     Breath sounds: Normal breath sounds.  Abdominal:     General: Abdomen is flat. There is no distension.     Palpations: Abdomen is soft.     Tenderness: There is no abdominal tenderness. There is no guarding or rebound.  Musculoskeletal:        General: Normal range of motion.     Cervical back: Neck supple.  Skin:    General: Skin is warm and dry.  Neurological:     General: No focal deficit present.     Mental Status: He is alert.  Psychiatric:        Mood and Affect: Mood normal.         Behavior: Behavior normal.     ED Results / Procedures / Treatments   Labs (all labs ordered are listed, but only abnormal results are displayed) Labs Reviewed - No data to display  EKG None  Radiology No results found.  Procedures Procedures    Medications Ordered in ED Medications  prochlorperazine (COMPAZINE) injection 10 mg (10 mg Intravenous Given 06/28/22 1516)  diphenhydrAMINE (BENADRYL) injection 12.5 mg (12.5 mg Intravenous Given 06/28/22 1517)  benzonatate (TESSALON) capsule 100 mg (100 mg Oral Given 06/28/22 1520)    ED Course/ Medical Decision Making/ A&P                             Medical Decision Making Risk Prescription drug management.   27 year old male presents to the ED due to left-sided facial pain/headache for the past few days.  Patient recently diagnosed with strep throat on 3/20 and currently on penicillin which he has been compliant with.  Patient notes sore throat has improved however, admits to severe left-sided facial pain and persistent cough.  No fever.  Denies chest pain and shortness of breath.  No facial edema.  Denies difficulties swallowing or changes to phonation.  Upon arrival, mildly tachycardic at 101 with otherwise unremarkable vitals.  Patient in no acute distress.  Tenderness throughout left maxillary and frontal sinuses.  Throat with mild erythema without tonsillar hypertrophy or exudates.  Uvula midline.  No abscess.  No meningismus to suggest meningitis.  No edema surrounding eye to suggest orbital or preseptal cellulitis.  Suspect cough related to postviral cough.  Will treat for left-sided headache/facial pain.  Could possibly be related to sinusitis.   Reassessed patient.  Patient admits to resolution in headache.  Possible related to sinus pressure.  Sinus pressure only present for a few days.  Will hold off on further antibiotics.  Patient currently on penicillin for strep throat.  No abscess on exam.  Normal phonation.  Tolerating  oral secretions without difficulty.  Discussed over-the-counter options for nasal congestion with patient.  Patient discharged with cough medication.  Follow-up with PCP if symptoms do not improve over the next few days. Strict ED precautions discussed with patient. Patient states understanding and agrees to plan. Patient discharged home in no acute distress and stable vitals  Hx depression, polysubstance abuse Has PCP       Final Clinical Impression(s) / ED Diagnoses Final diagnoses:  Acute nonintractable headache, unspecified headache type  Acute cough    Rx / DC Orders ED Discharge Orders          Ordered    benzonatate (TESSALON)  100 MG capsule  Every 8 hours        06/28/22 1622              Karie Kirks 06/28/22 1624    Dorie Rank, MD 06/29/22 [phone]

## 2022-06-28 NOTE — Discharge Instructions (Signed)
It was a pleasure taking care of you today.  As discussed, your exam was reassuring.  Continue taking your antibiotics as prescribed.  You may take over-the-counter ibuprofen or Tylenol as needed for headache.  I am sending you home with cough medication.  Take as needed.  Please follow-up with PCP if symptoms do not improved over the next few days.  Return to the ER for new or worsening symptoms.

## 2022-07-29 DIAGNOSIS — Z9151 Personal history of suicidal behavior: Secondary | ICD-10-CM | POA: Insufficient documentation

## 2022-07-29 DIAGNOSIS — F1090 Alcohol use, unspecified, uncomplicated: Secondary | ICD-10-CM | POA: Insufficient documentation

## 2022-07-29 DIAGNOSIS — R400 Somnolence: Secondary | ICD-10-CM | POA: Insufficient documentation

## 2022-07-29 DIAGNOSIS — Z9152 Personal history of nonsuicidal self-harm: Secondary | ICD-10-CM | POA: Insufficient documentation

## 2022-07-29 DIAGNOSIS — F1290 Cannabis use, unspecified, uncomplicated: Secondary | ICD-10-CM | POA: Insufficient documentation

## 2022-07-29 DIAGNOSIS — F319 Bipolar disorder, unspecified: Secondary | ICD-10-CM | POA: Insufficient documentation

## 2022-07-29 DIAGNOSIS — F1410 Cocaine abuse, uncomplicated: Secondary | ICD-10-CM | POA: Insufficient documentation

## 2022-07-29 DIAGNOSIS — R45851 Suicidal ideations: Secondary | ICD-10-CM | POA: Insufficient documentation

## 2022-07-29 NOTE — ED Triage Notes (Signed)
Pt presents to [facility] voluntarily. Pt reports thoughts of self harm. Pt reports history of cutting and reporting cutting as early as last week on his leg. Pt reports using a razor to self harm. Pt reports diagnosis of anxiety and depression. Pt reports having thoughts of wanting to cut himself.Pt reports stress due to financial stress and losing his son. Pt denies SI and HI, but endorses self harm. Pt denies AVH. Pt is urgent.

## 2022-07-30 ENCOUNTER — Other Ambulatory Visit (HOSPITAL_COMMUNITY)
Admission: EM | Admit: 2022-07-30 | Discharge: 2022-07-31 | Disposition: A | Discharge status: Home / Self Care | Payer: Medicaid Other | Attending: Urology | Admitting: Urology

## 2022-07-30 DIAGNOSIS — F1410 Cocaine abuse, uncomplicated: Secondary | ICD-10-CM | POA: Diagnosis not present

## 2022-07-30 DIAGNOSIS — F1290 Cannabis use, unspecified, uncomplicated: Secondary | ICD-10-CM | POA: Diagnosis not present

## 2022-07-30 DIAGNOSIS — F1090 Alcohol use, unspecified, uncomplicated: Secondary | ICD-10-CM | POA: Diagnosis not present

## 2022-07-30 DIAGNOSIS — F319 Bipolar disorder, unspecified: Secondary | ICD-10-CM | POA: Diagnosis not present

## 2022-07-30 DIAGNOSIS — F339 Major depressive disorder, recurrent, unspecified: Secondary | ICD-10-CM | POA: Diagnosis present

## 2022-07-30 DIAGNOSIS — F332 Major depressive disorder, recurrent severe without psychotic features: Secondary | ICD-10-CM

## 2022-07-30 LAB — LIPID PANEL
Cholesterol: 193 mg/dL (ref 0–200)
HDL: 37 mg/dL — ABNORMAL LOW (ref >40)
LDL Cholesterol: 110 mg/dL — ABNORMAL HIGH (ref 0–99)
Total CHOL/HDL Ratio: 5.2 RATIO
Triglycerides: 232 mg/dL — ABNORMAL HIGH (ref <150)
VLDL: 46 mg/dL — ABNORMAL HIGH (ref 0–40)

## 2022-07-30 LAB — POCT URINE DRUG SCREEN - MANUAL ENTRY (I-SCREEN)
POC Amphetamine UR: NOT DETECTED
POC Buprenorphine (BUP): NOT DETECTED
POC Cocaine UR: POSITIVE — AB
POC Marijuana UR: POSITIVE — AB
POC Methadone UR: NOT DETECTED
POC Methamphetamine UR: NOT DETECTED
POC Morphine: NOT DETECTED
POC Oxazepam (BZO): NOT DETECTED
POC Oxycodone UR: NOT DETECTED
POC Secobarbital (BAR): NOT DETECTED

## 2022-07-30 LAB — CBC WITH DIFFERENTIAL/PLATELET
Abs Immature Granulocytes: 0.05 10*3/uL (ref 0.00–0.07)
Basophils Absolute: 0.1 10*3/uL (ref 0.0–0.1)
Basophils Relative: 1 %
Eosinophils Absolute: 0.7 10*3/uL — ABNORMAL HIGH (ref 0.0–0.5)
Eosinophils Relative: 6 %
HCT: 40.5 % (ref 39.0–52.0)
Hemoglobin: 14.3 g/dL (ref 13.0–17.0)
Immature Granulocytes: 1 %
Lymphocytes Relative: 26 %
Lymphs Abs: 2.9 10*3/uL (ref 0.7–4.0)
MCH: 31.1 pg (ref 26.0–34.0)
MCHC: 35.3 g/dL (ref 30.0–36.0)
MCV: 88 fL (ref 80.0–100.0)
Monocytes Absolute: 1.1 10*3/uL — ABNORMAL HIGH (ref 0.1–1.0)
Monocytes Relative: 10 %
Neutro Abs: 6.3 10*3/uL (ref 1.7–7.7)
Neutrophils Relative %: 56 %
Platelets: 380 10*3/uL (ref 150–400)
RBC: 4.6 MIL/uL (ref 4.22–5.81)
RDW: 14 % (ref 11.5–15.5)
WBC: 11.1 10*3/uL — ABNORMAL HIGH (ref 4.0–10.5)
nRBC: 0 % (ref 0.0–0.2)

## 2022-07-30 LAB — COMPREHENSIVE METABOLIC PANEL
ALT: 15 U/L (ref 0–44)
AST: 19 U/L (ref 15–41)
Albumin: 3.5 g/dL (ref 3.5–5.0)
Alkaline Phosphatase: 44 U/L (ref 38–126)
Anion gap: 7 (ref 5–15)
BUN: 13 mg/dL (ref 6–20)
CO2: 26 mmol/L (ref 22–32)
Calcium: 9.3 mg/dL (ref 8.9–10.3)
Chloride: 105 mmol/L (ref 98–111)
Creatinine, Ser: 1.15 mg/dL (ref 0.61–1.24)
GFR, Estimated: >60 mL/min (ref >60)
Glucose, Bld: 94 mg/dL (ref 70–99)
Potassium: 4 mmol/L (ref 3.5–5.1)
Sodium: 138 mmol/L (ref 135–145)
Total Bilirubin: 0.3 mg/dL (ref 0.3–1.2)
Total Protein: 6 g/dL — ABNORMAL LOW (ref 6.5–8.1)

## 2022-07-30 LAB — HEMOGLOBIN A1C
Hgb A1c MFr Bld: 5.7 % — ABNORMAL HIGH (ref 4.8–5.6)
Mean Plasma Glucose: 116.89 mg/dL

## 2022-07-30 LAB — ETHANOL: Alcohol, Ethyl (B): <10 mg/dL (ref <10)

## 2022-07-30 LAB — TSH: TSH: 0.973 u[IU]/mL (ref 0.350–4.500)

## 2022-07-30 MED ORDER — SERTRALINE HCL 50 MG PO TABS: 150.0000 mg | ORAL_TABLET | Freq: Every day | ORAL | Status: DC

## 2022-07-30 MED ORDER — THIAMINE MONONITRATE 100 MG PO TABS
100.0000 mg | ORAL_TABLET | Freq: Every day | ORAL | Status: DC
  Administered 2022-07-31: 100 mg via ORAL
  Filled 2022-07-30: qty 1

## 2022-07-30 MED ORDER — LORAZEPAM 1 MG PO TABS: 1.0000 mg | ORAL_TABLET | Freq: Four times a day (QID) | ORAL | Status: DC | PRN

## 2022-07-30 MED ORDER — MAGNESIUM HYDROXIDE 400 MG/5ML PO SUSP: 30.0000 mL | Freq: Every day | ORAL | Status: DC | PRN

## 2022-07-30 MED ORDER — HYDROXYZINE HCL 25 MG PO TABS
25.0000 mg | ORAL_TABLET | Freq: Three times a day (TID) | ORAL | Status: DC | PRN
  Administered 2022-07-30: 25 mg via ORAL
  Filled 2022-07-30: qty 1

## 2022-07-30 MED ORDER — TRAZODONE HCL 50 MG PO TABS
50.0000 mg | ORAL_TABLET | Freq: Every evening | ORAL | Status: DC | PRN
  Administered 2022-07-30: 50 mg via ORAL
  Filled 2022-07-30: qty 1

## 2022-07-30 MED ORDER — LOPERAMIDE HCL 2 MG PO CAPS: 2.0000 mg | ORAL_CAPSULE | ORAL | Status: DC | PRN

## 2022-07-30 MED ORDER — HYDROXYZINE HCL 25 MG PO TABS: 25.0000 mg | ORAL_TABLET | Freq: Four times a day (QID) | ORAL | Status: DC | PRN

## 2022-07-30 MED ORDER — SERTRALINE HCL 50 MG PO TABS
75.0000 mg | ORAL_TABLET | Freq: Every day | ORAL | Status: DC
  Administered 2022-07-30 – 2022-07-31 (×2): 75 mg via ORAL
  Filled 2022-07-30 (×2): qty 1

## 2022-07-30 MED ORDER — THIAMINE HCL 100 MG/ML IJ SOLN
100.0000 mg | Freq: Once | INTRAMUSCULAR | Status: AC
  Administered 2022-07-30: 100 mg via INTRAMUSCULAR
  Filled 2022-07-30: qty 2

## 2022-07-30 MED ORDER — ADULT MULTIVITAMIN W/MINERALS CH
1.0000 | ORAL_TABLET | Freq: Every day | ORAL | Status: DC
  Administered 2022-07-30 – 2022-07-31 (×2): 1 via ORAL
  Filled 2022-07-30 (×2): qty 1

## 2022-07-30 MED ORDER — ACETAMINOPHEN 325 MG PO TABS: 650.0000 mg | ORAL_TABLET | Freq: Four times a day (QID) | ORAL | Status: DC | PRN

## 2022-07-30 MED ORDER — ALUM & MAG HYDROXIDE-SIMETH 200-200-20 MG/5ML PO SUSP: 30.0000 mL | ORAL | Status: DC | PRN

## 2022-07-30 MED ORDER — ONDANSETRON 4 MG PO TBDP: 4.0000 mg | ORAL_TABLET | Freq: Four times a day (QID) | ORAL | Status: DC | PRN

## 2022-07-30 NOTE — ED Notes (Signed)
Patient resting with no sxs of distress noted - will continue to monitor for safety 

## 2022-07-30 NOTE — ED Notes (Signed)
Pt is in Dayroom watching TV. Respirations are even and unlabored. No acute distress noted. Will continue to monitor for safety.  

## 2022-07-30 NOTE — Group Note (Signed)
Group Topic: Relapse and Recovery  Group Date: 07/30/2022 Start Time: 1949 End Time: 2000 Facilitators: Rae Lips B  Department: [facility]  Number of Participants: 0  Group Focus: relapse prevention Treatment Modality:  Cognitive Behavioral Therapy Interventions utilized were leisure development Purpose: regain self-worth Group Topic: @GROUPTOPIC @  Group Date: @GROUPDATE @ Start Time: @GROUPSTARTTIME @ End Time: @GROUPENDTIME @ Facilitators: @GROUPFACIL @  Department: @GROUPDEP @  Pt did not Attend group.             [name]  Name: Jonathan [name] Date of Birth: [birth year]  MR: [id number]    Level of Participation: NA Quality of Participation: NA Interactions with others: NA Mood/Affect: NA Triggers (if applicable): NA Cognition: NA Progress: Other Response: NA Plan: patient will be encouraged to Attend groups  Patients Problems:  Patient Active Problem List   Diagnosis Date Noted   MDD (major depressive disorder), recurrent episode (HCC) 07/30/2022   Alcohol use disorder 01/20/2022   Delta-9-tetrahydrocannabinol (THC) dependence (HCC) 01/20/2022   Cocaine use disorder (HCC) 01/20/2022   Pneumonia 05/23/2017   MDD (major depressive disorder), recurrent severe, without psychosis (HCC) 05/22/2017

## 2022-07-30 NOTE — Discharge Instructions (Signed)
[facility] [address] [place], [place], [address] [phone] phone  New Patient Assessment/Therapy Walk-Ins:  Monday and Wednesday: 8 am until slots are full. Every 1st and 2nd Fridays of the month: 1 pm - 5 pm.  NO ASSESSMENT/THERAPY WALK-INS ON TUESDAYS OR THURSDAYS  New Patient Assessment/Medication Management Walk-Ins:  Monday - Friday:  8 am - 11 am.  For all walk-ins, we ask that you arrive by 7:30 am because patients will be seen in the order of arrival.  Availability is limited; therefore, you may not be seen on the same day that you walk-in.  Our goal is to serve and meet the needs of our community to the best of our ability.  SUBSTANCE USE TREATMENT for Medicaid and State Funded/IPRS  [facility] [address] [place], [place], [address] [phone] phone NOTE: [facility] is no longer offering IOP services.  Serves those who are low-income or have no insurance.  [facility] [address], [place], [place], [address] [phone] phone [phone] fax NOTE: Does have Substance Abuse-Intensive Outpatient Program (SAIOP) as well as transitional housing if eligible.  [facility] [address] [place], [place], [address] [phone] phone [phone] fax  [facility] [address] [place], [place], [address] [phone] phone [phone] fax  HALFWAY HOUSES:  [facility] [phone]  [facility] www.oxfordvacancies.com  12 STEP PROGRAMS:  Alcoholics Anonymous of [place] https://aagreensboronc.com/meeting  Narcotics Anonymous of [place] https://greensborona.org/meetings/  [facility] [place] www.g[place]alanon.org/find-meetings.html  Nar-Anon https://nar-anon.org/find-a-meetin  List of Residential placements:   [facility] in [place]: [phone]  [facility]: [phone]  [facility]: [place], [place]  [phone]: Male and male facility; 30-day program: (uninsured and Medicaid such as [facility], [facility], [facility], partners)  [facility]: [phone]; men and women's facility; 28 days; Can have Medicaid tailored plan ([facility] or [facility])  [facility]: [phone] [place] or [place]; 28 day program; must be fully detox; tailored Medicaid or no insurance  [facility] in [place], [place]; [phone]; 28 day all males program; no insurance accepted  [facility] in [place]: Joe [phone] (no insurance or Medicaid only); 90 days; outpatient services but provide housing in apartments [place]  [facility] Admission: [phone]: Patient must complete phone screening for placement: [place], [place]; 6 month program; uninsured, Medicaid, and [facility] insurance.   [facility]: no insurance required; [phone]  [place] [facility]: [phone]; Intake: Robert; Must fill out application online; Victor Delay [phone] x [phone]  [facility] in [place], [place]: [phone]; Admissions Coordinators Mr. Dennis or David Gibson; 90 day program.  [facility]: [place], [place] [phone]; Co-Ed 9 month to a year program; Online application; Men entry fee is $500 (6-12months);  [facility]: [address] [place], [place] [address]; no fee or insurance required; minimum of 2 years; Highly structured; work based; Intake Coordinator is Chris [phone]  [facility] in [place], [place]: [phone]; Fax number is [phone]; website: [facility].org; Requires 3-6 page autobiography; 2 year program (18 months and then 6month transitional housing); Admission fee is $300; no insurance needed; work program  [facility] in [place], : Front Desk Staff: Reeci [phone]: They have a Men's Regenerations Program 6-9months. Free program; There is an initial $300 fee however, they are willing to work  with patients regarding that. Application is online.  [facility]: Admissions [phone] Benjamin Cox ext [phone]; Any 7-90 day program is out of pocket; 12   month program is free of charge; there is a $275 entry fee; Patient is responsible for own transportation 

## 2022-07-30 NOTE — ED Notes (Signed)
Pt did not want breakfast.
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
## 2022-07-30 NOTE — Progress Notes (Signed)
Received Jonathan [name] asleep in his room, he continued to sleep through breakfast. He was awaken by the provider for his team meeting. He was compliant with his medications. He endorsed feeling depressed this AM. Afterwards he made a phone call.
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
## 2022-07-30 NOTE — ED Notes (Signed)
Patient oriented to unit. Denies complaints at this time. Sandwich given - will continue to monitor for safety

## 2022-07-30 NOTE — Progress Notes (Signed)
Jonathan [name] continued to sleep throughout the morning, missing breakfast and lunch. He got up briefly this morning.

## 2022-07-30 NOTE — ED Provider Notes (Signed)
Facility Based Crisis Admission H&P  Date: 07/30/22 Patient Name: Jonathan [name] MRN: [id number] Chief Complaint: "I'm having thoughts about self harming"  Diagnoses:  Final diagnoses:  Severe episode of recurrent major depressive disorder, without psychotic features (HCC)    HPI: Jonathan [name] is a 27 year old male with psychiatric history significant for depression, polysubstance abuse (cocaine, delta 9, alcohol, marijuana), and suicidal attempt.  Patient presented with chief complaint of worsening depressive symptoms and thoughts of self-harm  Patient was evaluated face-to-face and his chart was reviewed by this nurse practitioner.  On assessment, patient reports that he is feeling depressed and overwhelmed.  Says that he is under a lot of stress due to recently losing his home and not having contact with his 67 years old son. Endorses depressive symptoms of worthlessness, hopelessness, isolation, crying spells, poor focus, irritability, racing thoughts, poor sleep, and poor appetite. Patient reports that he has history of cutting, he is noted with multiple scars on his left arm and right thigh. He says he was having thoughts about engaging in self harming by cutting tonight but decided to seek help. He says he last engaged in cutting "last week."  He denies active suicidal thought. He says he sometimes have passive suicidal thought of "I would like to go to sleep and not wake up." He denies suicidal plan or intent. He denies homicidal ideation, hallucination, and paranoia. He admits to drinking 1-3 cans of beer daily, he says he smokes marijuana occasionally, and uses cocaine daily. He reports snorting $300 worth od cocaine in the past 3 days.   Patient says that he does not have a psychiatric provider, therapist is currently not on any psychiatric medication and he is currently not seeing any therapist  Patient appears slightly drowsy but oriented x 4; he is calm and cooperative.  He speaks  in a clear tone, moderate rate and pace with good eye contact.  Patient's mood is depress with a congruent affect. His thought process is coherent.  Objectively no signs of psychosis, preoccupation, mania, distractibility, or delusional thought content noted during assessment.  Patient says he wants to remain a "private patient" and no one should be given information about his stay.  Patient was informed about HIPAA and [place] patient privacy policy.   PHQ 2-9:  Flowsheet Row ED from 07/30/2022 in [facility]  Thoughts that you would be better off dead, or of hurting yourself in some way Several days  PHQ-9 Total Score 20       Flowsheet Row ED from 07/30/2022 in [facility] ED from 06/28/2022 in [facility] Emergency Department at [facility] ED from 06/20/2022 in [facility] Emergency Department at [facility]  C-SSRS RISK CATEGORY Low Risk No Risk No Risk         Total Time spent with patient: 20 minutes  Musculoskeletal  Strength & Muscle Tone: within normal limits Gait & Station: normal Patient leans: Right  Psychiatric Specialty Exam  Presentation General Appearance:  Casual  Eye Contact: Good  Speech: Clear and Coherent  Speech Volume: Normal  Handedness: Right   Mood and Affect  Mood: Depressed  Affect: Congruent   Thought Process  Thought Processes: Coherent  Descriptions of Associations:Intact  Orientation:Full (Time, Place and Person)  Thought Content:WDL  Diagnosis of Schizophrenia or Schizoaffective disorder in past: No   Hallucinations:No data recorded Ideas of Reference:None  Suicidal Thoughts:Suicidal Thoughts: Yes, Passive SI Passive Intent and/or Plan: Without Intent; Without Plan  Homicidal Thoughts:Homicidal Thoughts: No   Sensorium  Memory: Immediate Good; Recent Good; Remote Fair  Judgment: Fair  Insight: Fair   Art therapist   Concentration: Fair  Attention Span: Poor  Recall: Poor  Fund of Knowledge: Fair  Language: Fair   Psychomotor Activity  Psychomotor Activity: Psychomotor Activity: Flacid   Assets  Assets: Communication Skills; Desire for Improvement; Physical Health; Vocational/Educational   Sleep  Sleep: Sleep: Fair Number of Hours of Sleep: 5   Nutritional Assessment (For OBS and FBC admissions only) Has the patient had a weight loss or gain of 10 pounds or more in the last 3 months?: No Has the patient had a decrease in food intake/or appetite?: No Does the patient have dental problems?: No Does the patient have eating habits or behaviors that may be indicators of an eating disorder including binging or inducing vomiting?: No Has the patient recently lost weight without trying?: 0 Has the patient been eating poorly because of a decreased appetite?: 1 Malnutrition Screening Tool Score: 1    Physical Exam Vitals and nursing note reviewed.  Constitutional:      General: He is not in acute distress.    Appearance: Normal appearance. He is well-developed. He is not ill-appearing.  HENT:     Head: Normocephalic and atraumatic.  Eyes:     Conjunctiva/sclera: Conjunctivae normal.  Cardiovascular:     Rate and Rhythm: Normal rate and regular rhythm.     Heart sounds: No murmur heard. Pulmonary:     Effort: Pulmonary effort is normal. No respiratory distress.     Breath sounds: Normal breath sounds.  Abdominal:     Palpations: Abdomen is soft.     Tenderness: There is no abdominal tenderness.  Musculoskeletal:        General: No swelling. Normal range of motion.     Cervical back: Normal range of motion and neck supple.  Skin:    General: Skin is warm and dry.     Capillary Refill: Capillary refill takes less than 2 seconds.  Neurological:     Mental Status: He is alert and oriented to person, place, and time.  Psychiatric:        Attention and Perception: Attention  and perception normal.        Mood and Affect: Mood is depressed.        Speech: Speech normal.        Behavior: Behavior normal. Behavior is cooperative.        Thought Content: Thought content normal.        Cognition and Memory: Cognition normal.    Review of Systems  Constitutional: Negative.   HENT: Negative.    Eyes: Negative.   Respiratory: Negative.    Cardiovascular: Negative.   Gastrointestinal: Negative.   Genitourinary: Negative.   Musculoskeletal: Negative.   Skin: Negative.   Neurological: Negative.   Endo/Heme/Allergies: Negative.   Psychiatric/Behavioral:  Positive for depression and substance abuse. The patient is nervous/anxious.     Blood pressure 116/73, pulse 76, temperature 97.9 F (36.6 C), temperature source Oral, resp. rate 18, SpO2 99 %. There is no height or weight on file to calculate BMI.  Past Psychiatric History: depression, polysubstance abuse (cocaine, delta 9, alcohol, marijuana), and suicidal attempt. Past Medical History:  Diagnosis Date   Bipolar 1 disorder (HCC)       Is the patient at risk to self? No  Has the patient been a risk to self in the past 6 months? Yes .  Has the patient been a risk to self within the distant past? No   Is the patient a risk to others? Yes   Has the patient been a risk to others in the past 6 months? No   Has the patient been a risk to others within the distant past? Yes   Past Medical History: pneumonia, depression, polysubstance abuse (cocaine, delta 9, alcohol, marijuana), and suicidal attempt. Family History: None Social History:  Social History   Tobacco Use   Smoking status: Every Day    Packs/day: 1    Types: Cigarettes   Smokeless tobacco: Never  Substance Use Topics   Alcohol use: Yes    Alcohol/week: 2.0 standard drinks of alcohol    Types: 2 Cans of beer per week    Comment: Occ   Drug use: Not Currently    Types: Marijuana     Last Labs:  Admission on 07/30/2022  Component  Date Value Ref Range Status   Alcohol, Ethyl (B) 07/30/2022 <10  <10 mg/dL Final   Comment: (NOTE) Lowest detectable limit for serum alcohol is 10 mg/dL.  For medical purposes only. Performed at [facility], [address] [place], [place] [address]    POC Amphetamine UR 07/30/2022 None Detected  NONE DETECTED (Cut Off Level 1000 ng/mL) Final   POC Secobarbital (BAR) 07/30/2022 None Detected  NONE DETECTED (Cut Off Level 300 ng/mL) Final   POC Buprenorphine (BUP) 07/30/2022 None Detected  NONE DETECTED (Cut Off Level 10 ng/mL) Final   POC Oxazepam (BZO) 07/30/2022 None Detected  NONE DETECTED (Cut Off Level 300 ng/mL) Final   POC Cocaine UR 07/30/2022 Positive (A)  NONE DETECTED (Cut Off Level 300 ng/mL) Final   POC Methamphetamine UR 07/30/2022 None Detected  NONE DETECTED (Cut Off Level 1000 ng/mL) Final   POC Morphine 07/30/2022 None Detected  NONE DETECTED (Cut Off Level 300 ng/mL) Final   POC Methadone UR 07/30/2022 None Detected  NONE DETECTED (Cut Off Level 300 ng/mL) Final   POC Oxycodone UR 07/30/2022 None Detected  NONE DETECTED (Cut Off Level 100 ng/mL) Final   POC Marijuana UR 07/30/2022 Positive (A)  NONE DETECTED (Cut Off Level 50 ng/mL) Final  Admission on 06/20/2022, Discharged on 06/20/2022  Component Date Value Ref Range Status   Group A Strep by PCR 06/20/2022 DETECTED (A)  NOT DETECTED Final   Performed at [facility], [address] [place], [place] [address]   SARS Coronavirus 2 by RT PCR 06/20/2022 NEGATIVE  NEGATIVE Final   Comment: (NOTE) SARS-CoV-2 target nucleic acids are NOT DETECTED.  The SARS-CoV-2 RNA is generally detectable in upper respiratory specimens during the acute phase of infection. The lowest concentration of SARS-CoV-2 viral copies this assay can detect is 138 copies/mL. A negative result does not preclude SARS-Cov-2 infection and should not be used as the sole basis for treatment or other patient management  decisions. A negative result may occur with  improper specimen collection/handling, submission of specimen other than nasopharyngeal swab, presence of viral mutation(s) within the areas targeted by this assay, and inadequate number of viral copies(<138 copies/mL). A negative result must be combined with clinical observations, patient history, and epidemiological information. The expected result is Negative.  Fact Sheet for Patients:  BloggerCourse.com  Fact Sheet for Healthcare Providers:  SeriousBroker.it  This test is no                          t  yet approved or cleared by the [place] and  has been authorized for detection and/or diagnosis of SARS-CoV-2 by FDA under an Emergency Use Authorization (EUA). This EUA will remain  in effect (meaning this test can be used) for the duration of the COVID-19 declaration under Section 564(b)(1) of the Act, 21 U.S.C.section 360bbb-3(b)(1), unless the authorization is terminated  or revoked sooner.       Influenza A by PCR 06/20/2022 NEGATIVE  NEGATIVE Final   Influenza B by PCR 06/20/2022 NEGATIVE  NEGATIVE Final   Comment: (NOTE) The Xpert Xpress SARS-CoV-2/FLU/RSV plus assay is intended as an aid in the diagnosis of influenza from Nasopharyngeal swab specimens and should not be used as a sole basis for treatment. Nasal washings and aspirates are unacceptable for Xpert Xpress SARS-CoV-2/FLU/RSV testing.  Fact Sheet for Patients: BloggerCourse.com  Fact Sheet for Healthcare Providers: SeriousBroker.it  This test is not yet approved or cleared by the [place] FDA and has been authorized for detection and/or diagnosis of SARS-CoV-2 by FDA under an Emergency Use Authorization (EUA). This EUA will remain in effect (meaning this test can be used) for the duration of the COVID-19 declaration under Section 564(b)(1) of the Act,  21 U.S.C. section 360bbb-3(b)(1), unless the authorization is terminated or revoked.     Resp Syncytial Virus by PCR 06/20/2022 NEGATIVE  NEGATIVE Final   Comment: (NOTE) Fact Sheet for Patients: BloggerCourse.com  Fact Sheet for Healthcare Providers: SeriousBroker.it  This test is not yet approved or cleared by the [place] FDA and has been authorized for detection and/or diagnosis of SARS-CoV-2 by FDA under an Emergency Use Authorization (EUA). This EUA will remain in effect (meaning this test can be used) for the duration of the COVID-19 declaration under Section 564(b)(1) of the Act, 21 U.S.C. section 360bbb-3(b)(1), unless the authorization is terminated or revoked.  Performed at [facility], [address] [place], [place] [address]     Allergies: Patient has no known allergies.  Medications:  Facility Ordered Medications  Medication   acetaminophen (TYLENOL) tablet 650 mg   alum & mag hydroxide-simeth (MAALOX/MYLANTA) 200-200-20 MG/5ML suspension 30 mL   magnesium hydroxide (MILK OF MAGNESIA) suspension 30 mL   traZODone (DESYREL) tablet 50 mg   multivitamin with minerals tablet 1 tablet   thiamine (VITAMIN B1) injection 100 mg   [START ON 07/31/2022] thiamine (VITAMIN B1) tablet 100 mg   LORazepam (ATIVAN) tablet 1 mg   hydrOXYzine (ATARAX) tablet 25 mg   loperamide (IMODIUM) capsule 2-4 mg   ondansetron (ZOFRAN-ODT) disintegrating tablet 4 mg   sertraline (ZOLOFT) tablet 75 mg   PTA Medications  Medication Sig   nicotine polacrilex (NICORETTE) 2 MG gum Take 1 each (2 mg total) by mouth as needed for smoking cessation.   sertraline (ZOLOFT) 50 MG tablet Take 3 tablets (150 mg total) by mouth daily.   traZODone (DESYREL) 50 MG tablet Take 1 tablet (50 mg total) by mouth at bedtime as needed for sleep.   Multiple Vitamin (MULTIVITAMIN WITH MINERALS) TABS tablet Take 1 tablet by mouth  daily.   hydrOXYzine (ATARAX) 25 MG tablet Take 1 tablet (25 mg total) by mouth 3 (three) times daily as needed for anxiety.   penicillin v potassium (VEETID) 500 MG tablet Take 1 tablet (500 mg total) by mouth 3 (three) times daily.   benzonatate (TESSALON) 100 MG capsule Take 1 capsule (100 mg total) by mouth every 8 (eight) hours.    Long Term Goals: Improvement in  symptoms so as ready for discharge  Short Term Goals: Patient will verbalize feelings in meetings with treatment team members., Patient will attend at least of 50% of the groups daily., Pt will complete the PHQ9 on admission, day 3 and discharge., Patient will participate in completing the [place] Suicide Severity Rating Scale, Patient will score a low risk of violence for 24 hours prior to discharge, and Patient will take medications as prescribed daily.  Medical Decision Making  Patient admitted to [facility] for stabilization and substance abuse treatment.  Review and resume home medication  -will start Sertraline at 75mg /day at titerate up as needed. Patient was previously on Sertraline 150mg /day but has not had this medication since 12/2021.   -Review available lab result  UDS + Cocaine and +Marijuana  Initiate CIWA protocol -lorazepam 1 mg every 6 hours prn for CIWA >10 -thiamine 100 mg daily for nutritional supplementation -hydroxyzine 25 mg every 6 hours prn for anxiety, CIWA < or = 10 -ondansetron 4 mg ODT every 6 hours prn nausea/vomiting -loperamide 2-4 mg capsule prn diarrhea or loose stools     Recommendations  Based on my evaluation the patient does not appear to have an emergency medical condition.  Maricela Bo, NP 07/30/22  2:53 AM

## 2022-07-30 NOTE — Tx Team (Signed)
LCSW met with patient to assess current mood, affect, physical state, and inquire about needs/goals while here in [facility] and after discharge. Patient reports he presented due to having increased depression and anxiety. Patient reports he recently just lost his home after being on [place] [address]. Patient reports rent continued to be charged and he could not afford it. Patient reports he is currently working at [employer] and has been for the last month. Patient reports he has been living with a family member, however reports the environment is stressful because the cousin is always yelling. Patient reports having no contact with other family members, and reports having limited family support. Patient reports he has been using alcohol and cocaine for about a year and a half. Patient denies any recent period of sobriety. Patient reports his current goal is to go to a residential program for further treatment. Patient denies any current legal charges or upcoming court dates. Patient in agreement with clinicals being faxed for review and aware that LCSW will follow up with updates once received.   Referral has been sent to [facility] for review.    Fernande Boyden, LCSW Clinical Social Worker [facility] Ph: [phone]

## 2022-07-30 NOTE — ED Notes (Signed)
Pt is in the bed sleeping. Respirations are even and unlabored. No acute distress noted. Will continue to monitor for safety. 

## 2022-07-30 NOTE — Progress Notes (Addendum)
Jonathan [name] was awaken for dinner, ate and completed his safety plan.  He stated not sleeping for 3 days prior to his arrival to this facility. He was compliant with the IM thiamine after dinner. He completed his safety plan and received a copy to take home.

## 2022-07-30 NOTE — ED Notes (Signed)
Pt had dinner 

## 2022-07-31 ENCOUNTER — Telehealth (HOSPITAL_COMMUNITY): Payer: Self-pay | Admitting: Licensed Clinical Social Worker

## 2022-07-31 DIAGNOSIS — F1090 Alcohol use, unspecified, uncomplicated: Secondary | ICD-10-CM | POA: Diagnosis not present

## 2022-07-31 DIAGNOSIS — F1290 Cannabis use, unspecified, uncomplicated: Secondary | ICD-10-CM | POA: Diagnosis not present

## 2022-07-31 DIAGNOSIS — F1410 Cocaine abuse, uncomplicated: Secondary | ICD-10-CM | POA: Diagnosis not present

## 2022-07-31 DIAGNOSIS — F319 Bipolar disorder, unspecified: Secondary | ICD-10-CM | POA: Diagnosis not present

## 2022-07-31 MED ORDER — ADULT MULTIVITAMIN W/MINERALS CH: 1.0000 | ORAL_TABLET | Freq: Every day | ORAL | Status: DC

## 2022-07-31 MED ORDER — SERTRALINE HCL 25 MG PO TABS: 75.0000 mg | ORAL_TABLET | Freq: Every day | ORAL | 0 refills | Status: DC

## 2022-07-31 MED ORDER — HYDROXYZINE HCL 25 MG PO TABS: 25.0000 mg | ORAL_TABLET | Freq: Four times a day (QID) | ORAL | 0 refills | Status: DC | PRN

## 2022-07-31 MED ORDER — TRAZODONE HCL 50 MG PO TABS: 50.0000 mg | ORAL_TABLET | Freq: Every evening | ORAL | 0 refills | Status: DC | PRN

## 2022-07-31 NOTE — ED Notes (Signed)
Patient is sitting in dayroom eating lunch.  No distress 

## 2022-07-31 NOTE — Group Note (Signed)
Group Topic: Relapse and Recovery  Group Date: 07/31/2022 Start Time: 0945 End Time: 1010 Facilitators: Jenean Lindau, RN  Department: [facility]  Number of Participants: 3  Group Focus: chemical dependency education Treatment Modality:  Patient-Centered Therapy Interventions utilized were patient education Purpose: trigger / craving management  Name: Jonathan [name] Date of Birth: [birth year]  MR: [id number]    Level of Participation: minimal Quality of Participation: drowsy Interactions with others: gave feedback Mood/Affect: closed / guarded Triggers (if applicable):   Cognition: not focused Progress: Gaining insight Response:   Plan: follow-up needed  Patients Problems:  Patient Active Problem List   Diagnosis Date Noted   MDD (major depressive disorder), recurrent episode (HCC) 07/30/2022   Alcohol use disorder 01/20/2022   Delta-9-tetrahydrocannabinol (THC) dependence (HCC) 01/20/2022   Cocaine use disorder (HCC) 01/20/2022   Pneumonia 05/23/2017   MDD (major depressive disorder), recurrent severe, without psychosis (HCC) 05/22/2017

## 2022-07-31 NOTE — ED Notes (Signed)
Pt is in the bed sleeping. Respirations are even and unlabored. No acute distress noted. Will continue to monitor for safety. 

## 2022-07-31 NOTE — Group Note (Signed)
Group Topic: Wellness  Group Date: 07/31/2022 Start Time: 0800 End Time: 0830 Facilitators: Emmit Pomfret D, NT  Department: [facility]  Number of Participants: 3  Group Focus: coping skills Treatment Modality:  Psychoeducation Interventions utilized were other packets Purpose: reinforce self-care  Name: Cordelro Gautreau Date of Birth: 08/11/1995  MR: [id number]    Level of Participation: active Quality of Participation: attentive Interactions with others: gave feedback Mood/Affect: appropriate Triggers (if applicable): n/a Cognition: coherent/clear Progress: Significant Response: n/a Plan: follow-up needed  Patients Problems:  Patient Active Problem List   Diagnosis Date Noted   MDD (major depressive disorder), recurrent episode (HCC) 07/30/2022   Alcohol use disorder 01/20/2022   Delta-9-tetrahydrocannabinol (THC) dependence (HCC) 01/20/2022   Cocaine use disorder (HCC) 01/20/2022   Pneumonia 05/23/2017   MDD (major depressive disorder), recurrent severe, without psychosis (HCC) 05/22/2017

## 2022-07-31 NOTE — Telephone Encounter (Signed)
The therapist receives a request from the [facility] that this therapist reach out to this patient regarding his interest in CD IOP. The therapist attempts to reach him leaving a HIPAA-compliant voicemail.  Myrna Blazer, MA, LCSW, [facility], LCAS 07/31/2022

## 2022-07-31 NOTE — ED Provider Notes (Signed)
FBC/OBS ASAP Discharge Summary  Date and Time: 07/31/2022 12:30 PM  Name: Jonathan [name]  MRN:  [id number]   Discharge Diagnoses:  Final diagnoses:  Severe episode of recurrent major depressive disorder, without psychotic features [facility])    HPI: Jonathan [name] is a 27 year old male with psychiatric history significant for depression, polysubstance abuse (cocaine, delta 9, alcohol, marijuana), and suicidal attempt.  Patient presented with chief complaint of worsening depressive symptoms and thoughts of self-harm.  He was [facility] crisis [facility] for [facility].   Jonathan [name], 27 y.o., male patient seen face to face by this provider and chart reviewed on 04//30/24.     UDS on admission is positive for cocaine and marijuana.  EtOH is negative.  Subjective: On today's assessment patient is alert/oriented x 4, cooperative.  He has normal speech and behavior.  Reports he was able to sleep throughout the night and states he is feeling better.  He is reporting that his depressive symptoms are improved.  Patient continues to deny SI/HI/AVH.  He does not appear to be responding to internal/external stimuli.  He verbally contracts for safety.  He denies any access to firearms/weapons.  Upon assessment he is requesting to be discharged.  States, "I have got to get back to my job is my only means for income".  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  He is concerned that if he does not get back to work he will be fired.  He is receptive to outpatient services such as CD IOP and individual therapy.  Social work notified and a referral was made.  He continues to deny any alcohol withdrawal symptoms.  He declines for any collateral to be contacted.    Stay Summary:   Jonathan [name] was admitted to the [facility] for MDD (major depressive disorder), recurrent episode (HCC), alcohol detox and crisis management.  He was treated with the following medications Zoloft 75 mg  daily, hydroxyzine 25 mg 3 times daily as needed, and trazodone 50 mg nightly.  Medications were tolerated with no adverse reactions.  Jonathan [name] was discharged with current medication and was instructed on how to take medications as prescribed.  He will be provide no 30-day printed prescription.  He was offered further treatment options upon discharge including but not limited to Residential, Intensive Outpatient, Outpatient treatment, and resources for shelters if needed.  Jonathan [name] will follow up with the services as listed below under Follow up Information.     Upon completion of this admission the Jonathan [name] was both mentally and medically stable for discharge denying suicidal/homicidal ideation, auditory/visual/tactile hallucinations, delusional thoughts and paranoia.      Total Time spent with patient: 30 minutes  Past Psychiatric History: As documented in H&P Past Medical History: As documented in H&P Family History: As documented in H&P Family Psychiatric History: As documented in H&P Social History: As documented in H&P Tobacco Cessation:  N/A, patient does not currently use tobacco products  Current Medications:  Current Facility-Administered Medications  Medication Dose Route Frequency Provider Last Rate Last Admin   acetaminophen (TYLENOL) tablet 650 mg  650 mg Oral Q6H PRN Ajibola, Ene A, NP       alum & mag hydroxide-simeth (MAALOX/MYLANTA) 200-200-20 MG/5ML suspension 30 mL  30 mL Oral Q4H PRN Ajibola, Ene A, NP       hydrOXYzine (ATARAX) tablet 25 mg  25 mg Oral Q6H PRN Ajibola, Ene A, NP  loperamide (IMODIUM) capsule 2-4 mg  2-4 mg Oral PRN Ajibola, Ene A, NP       LORazepam (ATIVAN) tablet 1 mg  1 mg Oral Q6H PRN Ajibola, Ene A, NP       magnesium hydroxide (MILK OF MAGNESIA) suspension 30 mL  30 mL Oral Daily PRN Ajibola, Ene A, NP       multivitamin with minerals tablet 1 tablet  1 tablet Oral Daily Ajibola, Ene A, NP   1 tablet at 07/31/22 0845    ondansetron (ZOFRAN-ODT) disintegrating tablet 4 mg  4 mg Oral Q6H PRN Ajibola, Ene A, NP       sertraline (ZOLOFT) tablet 75 mg  75 mg Oral Daily Ajibola, Ene A, NP   75 mg at 07/31/22 0846   thiamine (VITAMIN B1) tablet 100 mg  100 mg Oral Daily Ajibola, Ene A, NP   100 mg at 07/31/22 0845   traZODone (DESYREL) tablet 50 mg  50 mg Oral QHS PRN Ajibola, Ene A, NP   50 mg at 07/30/22 0130   Current Outpatient Medications  Medication Sig Dispense Refill   hydrOXYzine (ATARAX) 25 MG tablet Take 1 tablet (25 mg total) by mouth 3 (three) times daily as needed for anxiety. (Patient not taking: Reported on 07/30/2022) 30 tablet 0   Multiple Vitamin (MULTIVITAMIN WITH MINERALS) TABS tablet Take 1 tablet by mouth daily. (Patient not taking: Reported on 07/30/2022)     nicotine polacrilex (NICORETTE) 2 MG gum Take 1 each (2 mg total) by mouth as needed for smoking cessation. (Patient not taking: Reported on 07/30/2022) 100 tablet 0   sertraline (ZOLOFT) 50 MG tablet Take 3 tablets (150 mg total) by mouth daily. (Patient not taking: Reported on 07/30/2022) 90 tablet 0   traZODone (DESYREL) 50 MG tablet Take 1 tablet (50 mg total) by mouth at bedtime as needed for sleep. (Patient not taking: Reported on 07/30/2022) 30 tablet 0    PTA Medications:  Facility Ordered Medications  Medication   acetaminophen (TYLENOL) tablet 650 mg   alum & mag hydroxide-simeth (MAALOX/MYLANTA) 200-200-20 MG/5ML suspension 30 mL   magnesium hydroxide (MILK OF MAGNESIA) suspension 30 mL   traZODone (DESYREL) tablet 50 mg   multivitamin with minerals tablet 1 tablet   [COMPLETED] thiamine (VITAMIN B1) injection 100 mg   thiamine (VITAMIN B1) tablet 100 mg   LORazepam (ATIVAN) tablet 1 mg   hydrOXYzine (ATARAX) tablet 25 mg   loperamide (IMODIUM) capsule 2-4 mg   ondansetron (ZOFRAN-ODT) disintegrating tablet 4 mg   sertraline (ZOLOFT) tablet 75 mg   PTA Medications  Medication Sig   nicotine polacrilex (NICORETTE) 2 MG gum  Take 1 each (2 mg total) by mouth as needed for smoking cessation. (Patient not taking: Reported on 07/30/2022)   sertraline (ZOLOFT) 50 MG tablet Take 3 tablets (150 mg total) by mouth daily. (Patient not taking: Reported on 07/30/2022)   traZODone (DESYREL) 50 MG tablet Take 1 tablet (50 mg total) by mouth at bedtime as needed for sleep. (Patient not taking: Reported on 07/30/2022)   Multiple Vitamin (MULTIVITAMIN WITH MINERALS) TABS tablet Take 1 tablet by mouth daily. (Patient not taking: Reported on 07/30/2022)   hydrOXYzine (ATARAX) 25 MG tablet Take 1 tablet (25 mg total) by mouth 3 (three) times daily as needed for anxiety. (Patient not taking: Reported on 07/30/2022)       07/30/2022    2:36 AM  Depression screen PHQ 2/9  Decreased Interest 3  Down, Depressed, Hopeless 3  PHQ - 2 Score 6  Altered sleeping 3  Tired, decreased energy 3  Change in appetite 2  Feeling bad or failure about yourself  3  Moving slowly or fidgety/restless 2  Suicidal thoughts 1  PHQ-9 Score 20  Difficult doing work/chores Extremely dIfficult    Flowsheet Row ED from 07/30/2022 in [facility] ED from 06/28/2022 in [facility] Emergency Department at [facility] ED from 06/20/2022 in [facility] Emergency Department at [facility]  C-SSRS RISK CATEGORY Low Risk No Risk No Risk       Musculoskeletal  Strength & Muscle Tone: within normal limits Gait & Station: normal Patient leans: N/A  Psychiatric Specialty Exam  Presentation  General Appearance:  Casual  Eye Contact: Fair  Speech: Clear and Coherent; Normal Rate  Speech Volume: Normal  Handedness: Right   Mood and Affect  Mood: Depressed  Affect: Congruent   Thought Process  Thought Processes: Coherent  Descriptions of Associations:Intact  Orientation:Full (Time, Place and Person)  Thought Content:Logical  Diagnosis of Schizophrenia or Schizoaffective disorder in past: No     Hallucinations:Hallucinations: None  Ideas of Reference:None  Suicidal Thoughts:Suicidal Thoughts: No SI Passive Intent and/or Plan: Without Intent; Without Plan  Homicidal Thoughts:Homicidal Thoughts: No   Sensorium  Memory: Immediate Good; Recent Good; Remote Good  Judgment: Fair  Insight: Fair   Chartered certified accountant: Fair  Attention Span: Fair  Recall: Fair  Fund of Knowledge: Fair  Language: Fair   Psychomotor Activity  Psychomotor Activity: Psychomotor Activity: Normal   Assets  Assets: Resilience; Physical Health; Desire for Improvement   Sleep  Sleep: Sleep: Fair Number of Hours of Sleep: 5   Nutritional Assessment (For OBS and FBC admissions only) Has the patient had a weight loss or gain of 10 pounds or more in the last 3 months?: No Has the patient had a decrease in food intake/or appetite?: No Does the patient have dental problems?: No Does the patient have eating habits or behaviors that may be indicators of an eating disorder including binging or inducing vomiting?: No Has the patient recently lost weight without trying?: 0 Has the patient been eating poorly because of a decreased appetite?: 1 Malnutrition Screening Tool Score: 1    Physical Exam  Physical Exam Vitals and nursing note reviewed.  Constitutional:      General: He is not in acute distress.    Appearance: He is well-developed.  HENT:     Head: Normocephalic and atraumatic.  Eyes:     General:        Right eye: No discharge.        Left eye: No discharge.     Conjunctiva/sclera: Conjunctivae normal.  Cardiovascular:     Rate and Rhythm: Normal rate.  Pulmonary:     Effort: Pulmonary effort is normal. No respiratory distress.  Musculoskeletal:        General: No swelling. Normal range of motion.     Cervical back: Normal range of motion.  Skin:    Coloration: Skin is not jaundiced or pale.  Neurological:     Mental Status: He is alert and  oriented to person, place, and time.  Psychiatric:        Attention and Perception: Attention and perception normal.        Mood and Affect: Mood and affect normal.        Speech: Speech normal.        Behavior: Behavior normal. Behavior is  cooperative.        Cognition and Memory: Cognition normal.        Judgment: Judgment normal.    Review of Systems  Constitutional: Negative.   HENT: Negative.    Eyes: Negative.   Respiratory: Negative.    Cardiovascular: Negative.   Musculoskeletal: Negative.   Skin: Negative.   Neurological: Negative.    Blood pressure 127/84, pulse 76, temperature 98.6 F (37 C), temperature source Tympanic, resp. rate 18, SpO2 100 %. There is no height or weight on file to calculate BMI.  Demographic Factors:  Male and Adolescent or young adult  Loss Factors: NA  Historical Factors: NA  Risk Reduction Factors:   Sense of responsibility to family, Living with another person, especially a relative, Positive social support, Positive therapeutic relationship, and Positive coping skills or problem solving skills  Continued Clinical Symptoms:  Severe Anxiety and/or Agitation Depression:   Comorbid alcohol abuse/dependence Alcohol/Substance Abuse/Dependencies  Cognitive Features That Contribute To Risk:  None    Suicide Risk:  Minimal: No identifiable suicidal ideation.  Patients presenting with no risk factors but with morbid ruminations; may be classified as minimal risk based on the severity of the depressive symptoms  Plan Of Care/Follow-up recommendations:  Activity:  as tolerated  Diet:  regular   Disposition:   Discharge patient  Provided outpatient psychiatric resources for medication management and therapy provided resources for CD IOP, and residential substance abuse treatment centers.  Provided printed prescriptions for 30-day supply of hydroxyzine 25 mg 3 times daily as needed, Zoloft 75 mg daily, and 50 mg trazodone  daily.  Ardis Hughs, NP 07/31/2022, 12:30 PM

## 2022-07-31 NOTE — Discharge Planning (Signed)
LCSW received update from NP that patient is requesting to be discharged on today so that he may return back to work. Patient expressed an interest in CDIOP with the [facility]. LCSW reached out to Alene Mires to make referral. Representative to follow up with patient regarding services. Additional resources have been updated in the patient's AVS. LCSW will sign off at this time.   Please inform if further LCSW needs arise prior to discharge.   Fernande Boyden, LCSW Clinical Social Worker [place] [facility] Ph: [phone]

## 2022-08-16 ENCOUNTER — Emergency Department (HOSPITAL_COMMUNITY)
Admission: EM | Admit: 2022-08-16 | Discharge: 2022-08-16 | Disposition: A | Discharge status: Home / Self Care | Payer: Medicaid Other | Attending: Emergency Medicine | Admitting: Emergency Medicine

## 2022-08-16 ENCOUNTER — Encounter (HOSPITAL_COMMUNITY): Payer: Self-pay

## 2022-08-16 ENCOUNTER — Emergency Department (HOSPITAL_COMMUNITY): Payer: Medicaid Other

## 2022-08-16 DIAGNOSIS — Z5321 Procedure and treatment not carried out due to patient leaving prior to being seen by health care provider: Secondary | ICD-10-CM | POA: Diagnosis not present

## 2022-08-16 DIAGNOSIS — R079 Chest pain, unspecified: Secondary | ICD-10-CM | POA: Diagnosis not present

## 2022-08-16 LAB — CBC
HCT: 45.2 % (ref 39.0–52.0)
Hemoglobin: 15.5 g/dL (ref 13.0–17.0)
MCH: 30.9 pg (ref 26.0–34.0)
MCHC: 34.3 g/dL (ref 30.0–36.0)
MCV: 90 fL (ref 80.0–100.0)
Platelets: 271 10*3/uL (ref 150–400)
RBC: 5.02 MIL/uL (ref 4.22–5.81)
RDW: 13.7 % (ref 11.5–15.5)
WBC: 8.7 10*3/uL (ref 4.0–10.5)
nRBC: 0 % (ref 0.0–0.2)

## 2022-08-16 LAB — BASIC METABOLIC PANEL
Anion gap: 7 (ref 5–15)
BUN: 13 mg/dL (ref 6–20)
CO2: 22 mmol/L (ref 22–32)
Calcium: 8.4 mg/dL — ABNORMAL LOW (ref 8.9–10.3)
Chloride: 106 mmol/L (ref 98–111)
Creatinine, Ser: 1.05 mg/dL (ref 0.61–1.24)
GFR, Estimated: >60 mL/min (ref >60)
Glucose, Bld: 140 mg/dL — ABNORMAL HIGH (ref 70–99)
Potassium: 3.8 mmol/L (ref 3.5–5.1)
Sodium: 135 mmol/L (ref 135–145)

## 2022-08-16 LAB — TROPONIN I (HIGH SENSITIVITY): Troponin I (High Sensitivity): <2 ng/L (ref <18)

## 2022-08-16 NOTE — ED Provider Triage Note (Signed)
Emergency Medicine Provider Triage Evaluation Note  Jonathan [name] , a 27 y.o. male  was evaluated in triage.  Patient complains of left-sided chest pain.  Reports that it started earlier today.  This has happened before and it was secondary to smoking.  Reports that he continues to smoke but he wanted to make sure that there was nothing more serious.  No known medical problems  Review of Systems  Positive:  Negative:   Physical Exam  BP 107/64 (BP Location: Left Arm)   Pulse 68   Temp 98.3 F (36.8 C) (Oral)   Resp 10   SpO2 98%  Gen:   Awake, no distress   Resp:  Normal effort  MSK:   Moves extremities without difficulty  Other:    Medical Decision Making  Medically screening exam initiated at 6:18 PM.  Appropriate orders placed.  Torsten Hefter was informed that the remainder of the evaluation will be completed by another provider, this initial triage assessment does not replace that evaluation, and the importance of remaining in the ED until their evaluation is complete.     Saddie Benders, PA-C 08/16/22 1818

## 2022-08-16 NOTE — ED Notes (Signed)
Patient called without answer x3

## 2022-08-16 NOTE — ED Triage Notes (Signed)
Pt presents with c/o chest pain that started this morning when he woke up on the left side of his chest.

## 2022-10-07 ENCOUNTER — Emergency Department (HOSPITAL_COMMUNITY): Payer: MEDICAID

## 2022-10-07 ENCOUNTER — Emergency Department (HOSPITAL_COMMUNITY)
Admission: EM | Admit: 2022-10-07 | Discharge: 2022-10-07 | Disposition: A | Discharge status: Home / Self Care | Payer: MEDICAID | Attending: Emergency Medicine | Admitting: Emergency Medicine

## 2022-10-07 DIAGNOSIS — S93401A Sprain of unspecified ligament of right ankle, initial encounter: Secondary | ICD-10-CM

## 2022-10-07 DIAGNOSIS — S99911A Unspecified injury of right ankle, initial encounter: Secondary | ICD-10-CM | POA: Diagnosis not present

## 2022-10-07 DIAGNOSIS — X501XXA Overexertion from prolonged static or awkward postures, initial encounter: Secondary | ICD-10-CM | POA: Insufficient documentation

## 2022-10-07 NOTE — ED Triage Notes (Addendum)
Pt reports taking a high step down from a shed, planting his foot into a hole and rolling his rt ankle. Pt reports his foot was sideways with his ankle dislocated, but it went back into place. Now unable to place any weight on his foot.

## 2022-10-07 NOTE — Discharge Instructions (Addendum)
You are seen in the emergency department today for right ankle injury.  As we discussed your x-ray did not show any broken or dislocated bones.  I suspect that you likely sprained your ankle.  We have wrapped it in an ace wrap, keep wrapping it like this at home. You can use ice for swelling. You can use ibuprofen and/or tylenol as needed for pain.  I want you to follow up with the orthopedist if your symptoms are not getting any better, I've attached their contact information.  Continue to monitor how you're doing and return to the ER for new or worsening symptoms.

## 2022-10-07 NOTE — ED Provider Notes (Signed)
[place] EMERGENCY DEPARTMENT AT [facility] Provider Note   CSN: [id number] Arrival date & time: 10/07/22  1536     History  Chief Complaint  Patient presents with   rt ankle injury    Jonathan [name] is a 27 y.o. male with history of depression and, polysubstance use, who presents the emergency department complaining of a right ankle injury.  Patient states that he was stepping down out of a shed, actually planting his foot in a hole in the ground.  He rolled his right ankle.  He feels as though his foot was sideways and that his ankle "dislocated", but "went back into place".  Now unable to bear weight on the affected foot.  HPI     Home Medications Prior to Admission medications   Medication Sig Start Date End Date Taking? Authorizing Provider  hydrOXYzine (ATARAX) 25 MG tablet Take 1 tablet (25 mg total) by mouth every 6 (six) hours as needed (anxiety/agitation or CIWA < or = 10). 07/31/22   Ardis Hughs, NP  Multiple Vitamin (MULTIVITAMIN WITH MINERALS) TABS tablet Take 1 tablet by mouth daily. 07/31/22   Ardis Hughs, NP  sertraline (ZOLOFT) 25 MG tablet Take 3 tablets (75 mg total) by mouth daily. 08/01/22   Ardis Hughs, NP  traZODone (DESYREL) 50 MG tablet Take 1 tablet (50 mg total) by mouth at bedtime as needed for sleep. 07/31/22   Ardis Hughs, NP      Allergies    Patient has no known allergies.    Review of Systems   Review of Systems  Musculoskeletal:  Positive for arthralgias.  All other systems reviewed and are negative.   Physical Exam Updated Vital Signs BP 119/78 (BP Location: Left Arm)   Pulse 71   Temp 98.3 F (36.8 C) (Oral)   Resp 18   SpO2 97%  Physical Exam Vitals and nursing note reviewed.  Constitutional:      Appearance: Normal appearance.  HENT:     Head: Normocephalic and atraumatic.  Eyes:     Conjunctiva/sclera: Conjunctivae normal.  Pulmonary:     Effort: Pulmonary effort is normal. No  respiratory distress.  Feet:     Comments: Generalized swelling noted to the right lateral malleolus with tenderness, able to range the digits, mild sensation change, normal capillary refill Skin:    General: Skin is warm and dry.  Neurological:     Mental Status: He is alert.  Psychiatric:        Mood and Affect: Mood normal.        Behavior: Behavior normal.     ED Results / Procedures / Treatments   Labs (all labs ordered are listed, but only abnormal results are displayed) Labs Reviewed - No data to display  EKG None  Radiology DG Ankle Complete Right  Result Date: 10/07/2022 CLINICAL DATA:  Stepped in hole and twisted foot. EXAM: RIGHT ANKLE - COMPLETE 3+ VIEW COMPARISON:  None Available. FINDINGS: There is no acute fracture or dislocation. Bony alignment is normal. The joint spaces are preserved. There is no erosive change. The soft tissues are unremarkable. IMPRESSION: No acute fracture or dislocation. Electronically Signed   By: Lesia Hausen M.D.   On: 10/07/2022 16:23    Procedures Procedures    Medications Ordered in ED Medications - No data to display  ED Course/ Medical Decision Making/ A&P  Medical Decision Making Amount and/or Complexity of Data Reviewed Radiology: ordered.   This patient is a 27 y.o. male  who presents to the ED for concern of right ankle injury.   Past Medical History / Co-morbidities / Social History: depression and, polysubstance use  Physical Exam: Physical exam performed. The pertinent findings include: right lateral malleolus swelling and pain, no bony deformity, neurovascularly intact  Lab Tests/Imaging studies: I personally interpreted labs/imaging and the pertinent results include:  XR right ankle without traumatic findings. I agree with the radiologist interpretation.  Disposition: After consideration of the diagnostic results and the patients response to treatment, I feel that emergency  department workup does not suggest an emergent condition requiring admission or immediate intervention beyond what has been performed at this time. The plan is: Discharged home with symptomatic management of likely right ankle injury.  Reassuring imaging and examination.  Given Ace wrap and crutches.  Provided with orthopedic follow-up if symptoms do not improve.. The patient is safe for discharge and has been instructed to return immediately for worsening symptoms, change in symptoms or any other concerns.  Final Clinical Impression(s) / ED Diagnoses Final diagnoses:  Sprain of right ankle, unspecified ligament, initial encounter    Rx / DC Orders ED Discharge Orders     None      Portions of this report may have been transcribed using voice recognition software. Every effort was made to ensure accuracy; however, inadvertent computerized transcription errors may be present.    Su Monks, PA-C 10/07/22 1652    Rexford Maus, DO 10/07/22 1924

## 2022-11-17 ENCOUNTER — Encounter (HOSPITAL_COMMUNITY): Payer: Self-pay | Admitting: *Deleted

## 2022-11-17 ENCOUNTER — Inpatient Hospital Stay (HOSPITAL_COMMUNITY)
Admission: EM | Admit: 2022-11-17 | Discharge: 2022-11-21 | DRG: 885 | Disposition: A | Discharge status: Home / Self Care | Payer: MEDICAID | Source: Intra-hospital | Attending: Psychiatry | Admitting: Psychiatry

## 2022-11-17 ENCOUNTER — Encounter (HOSPITAL_COMMUNITY): Payer: Self-pay | Admitting: Adult Health

## 2022-11-17 ENCOUNTER — Other Ambulatory Visit: Payer: Self-pay

## 2022-11-17 ENCOUNTER — Emergency Department (HOSPITAL_COMMUNITY)
Admission: EM | Admit: 2022-11-17 | Discharge: 2022-11-17 | Disposition: A | Discharge status: Other Acute Inpatient Hospital | Payer: MEDICAID | Attending: Emergency Medicine | Admitting: Emergency Medicine

## 2022-11-17 DIAGNOSIS — F1020 Alcohol dependence, uncomplicated: Secondary | ICD-10-CM | POA: Diagnosis not present

## 2022-11-17 DIAGNOSIS — F1420 Cocaine dependence, uncomplicated: Secondary | ICD-10-CM | POA: Insufficient documentation

## 2022-11-17 DIAGNOSIS — Z5900 Homelessness unspecified: Secondary | ICD-10-CM | POA: Diagnosis not present

## 2022-11-17 DIAGNOSIS — S71102A Unspecified open wound, left thigh, initial encounter: Secondary | ICD-10-CM | POA: Insufficient documentation

## 2022-11-17 DIAGNOSIS — F17210 Nicotine dependence, cigarettes, uncomplicated: Secondary | ICD-10-CM | POA: Diagnosis present

## 2022-11-17 DIAGNOSIS — X780XXA Intentional self-harm by sharp glass, initial encounter: Secondary | ICD-10-CM | POA: Diagnosis not present

## 2022-11-17 DIAGNOSIS — G4700 Insomnia, unspecified: Secondary | ICD-10-CM | POA: Diagnosis present

## 2022-11-17 DIAGNOSIS — Z825 Family history of asthma and other chronic lower respiratory diseases: Secondary | ICD-10-CM | POA: Diagnosis not present

## 2022-11-17 DIAGNOSIS — Z833 Family history of diabetes mellitus: Secondary | ICD-10-CM | POA: Diagnosis not present

## 2022-11-17 DIAGNOSIS — F32A Depression, unspecified: Secondary | ICD-10-CM | POA: Diagnosis present

## 2022-11-17 DIAGNOSIS — Z62890 Parent-child estrangement NEC: Secondary | ICD-10-CM

## 2022-11-17 DIAGNOSIS — Z9152 Personal history of nonsuicidal self-harm: Secondary | ICD-10-CM

## 2022-11-17 DIAGNOSIS — Z79899 Other long term (current) drug therapy: Secondary | ICD-10-CM | POA: Diagnosis not present

## 2022-11-17 DIAGNOSIS — F1210 Cannabis abuse, uncomplicated: Secondary | ICD-10-CM | POA: Diagnosis not present

## 2022-11-17 DIAGNOSIS — R41843 Psychomotor deficit: Secondary | ICD-10-CM | POA: Diagnosis present

## 2022-11-17 DIAGNOSIS — F411 Generalized anxiety disorder: Secondary | ICD-10-CM | POA: Diagnosis present

## 2022-11-17 DIAGNOSIS — F1494 Cocaine use, unspecified with cocaine-induced mood disorder: Secondary | ICD-10-CM | POA: Diagnosis present

## 2022-11-17 DIAGNOSIS — Z800 Family history of malignant neoplasm of digestive organs: Secondary | ICD-10-CM | POA: Diagnosis not present

## 2022-11-17 DIAGNOSIS — F1414 Cocaine abuse with cocaine-induced mood disorder: Secondary | ICD-10-CM | POA: Diagnosis present

## 2022-11-17 DIAGNOSIS — F1010 Alcohol abuse, uncomplicated: Secondary | ICD-10-CM | POA: Diagnosis present

## 2022-11-17 DIAGNOSIS — F1220 Cannabis dependence, uncomplicated: Secondary | ICD-10-CM | POA: Diagnosis present

## 2022-11-17 DIAGNOSIS — R45851 Suicidal ideations: Secondary | ICD-10-CM

## 2022-11-17 DIAGNOSIS — Y901 Blood alcohol level of 20-39 mg/100 ml: Secondary | ICD-10-CM | POA: Insufficient documentation

## 2022-11-17 DIAGNOSIS — Z7289 Other problems related to lifestyle: Secondary | ICD-10-CM

## 2022-11-17 DIAGNOSIS — Z91199 Patient's noncompliance with other medical treatment and regimen due to unspecified reason: Secondary | ICD-10-CM | POA: Diagnosis not present

## 2022-11-17 DIAGNOSIS — F1090 Alcohol use, unspecified, uncomplicated: Secondary | ICD-10-CM | POA: Diagnosis present

## 2022-11-17 DIAGNOSIS — S79922A Unspecified injury of left thigh, initial encounter: Secondary | ICD-10-CM | POA: Diagnosis present

## 2022-11-17 DIAGNOSIS — F332 Major depressive disorder, recurrent severe without psychotic features: Secondary | ICD-10-CM | POA: Insufficient documentation

## 2022-11-17 DIAGNOSIS — Z560 Unemployment, unspecified: Secondary | ICD-10-CM | POA: Diagnosis not present

## 2022-11-17 DIAGNOSIS — F1410 Cocaine abuse, uncomplicated: Secondary | ICD-10-CM | POA: Diagnosis present

## 2022-11-17 LAB — CBC
HCT: 46.3 % (ref 39.0–52.0)
Hemoglobin: 15.9 g/dL (ref 13.0–17.0)
MCH: 30.6 pg (ref 26.0–34.0)
MCHC: 34.3 g/dL (ref 30.0–36.0)
MCV: 89 fL (ref 80.0–100.0)
Platelets: 307 10*3/uL (ref 150–400)
RBC: 5.2 MIL/uL (ref 4.22–5.81)
RDW: 13.3 % (ref 11.5–15.5)
WBC: 9.6 10*3/uL (ref 4.0–10.5)
nRBC: 0 % (ref 0.0–0.2)

## 2022-11-17 LAB — ACETAMINOPHEN LEVEL: Acetaminophen (Tylenol), Serum: <10 ug/mL — ABNORMAL LOW (ref 10–30)

## 2022-11-17 LAB — COMPREHENSIVE METABOLIC PANEL
ALT: 18 U/L (ref 0–44)
AST: 23 U/L (ref 15–41)
Albumin: 4.1 g/dL (ref 3.5–5.0)
Alkaline Phosphatase: 30 U/L — ABNORMAL LOW (ref 38–126)
Anion gap: 13 (ref 5–15)
BUN: 8 mg/dL (ref 6–20)
CO2: 22 mmol/L (ref 22–32)
Calcium: 9 mg/dL (ref 8.9–10.3)
Chloride: 103 mmol/L (ref 98–111)
Creatinine, Ser: 1.36 mg/dL — ABNORMAL HIGH (ref 0.61–1.24)
GFR, Estimated: >60 mL/min (ref >60)
Glucose, Bld: 85 mg/dL (ref 70–99)
Potassium: 3.5 mmol/L (ref 3.5–5.1)
Sodium: 138 mmol/L (ref 135–145)
Total Bilirubin: 0.4 mg/dL (ref 0.3–1.2)
Total Protein: 6.8 g/dL (ref 6.5–8.1)

## 2022-11-17 LAB — SALICYLATE LEVEL: Salicylate Lvl: <7 mg/dL — ABNORMAL LOW (ref 7.0–30.0)

## 2022-11-17 LAB — RAPID URINE DRUG SCREEN, HOSP PERFORMED
Amphetamines: NOT DETECTED
Barbiturates: NOT DETECTED
Benzodiazepines: NOT DETECTED
Cocaine: POSITIVE — AB
Opiates: NOT DETECTED
Tetrahydrocannabinol: POSITIVE — AB

## 2022-11-17 LAB — ETHANOL: Alcohol, Ethyl (B): 39 mg/dL — ABNORMAL HIGH (ref <10)

## 2022-11-17 MED ORDER — LORAZEPAM 2 MG/ML IJ SOLN: 2.0000 mg | Freq: Three times a day (TID) | INTRAMUSCULAR | Status: DC | PRN

## 2022-11-17 MED ORDER — BACITRACIN-NEOMYCIN-POLYMYXIN OINTMENT TUBE
TOPICAL_OINTMENT | Freq: Two times a day (BID) | CUTANEOUS | Status: DC
  Administered 2022-11-18: 1 via TOPICAL
  Filled 2022-11-17 (×2): qty 14.17

## 2022-11-17 MED ORDER — DIPHENHYDRAMINE HCL 50 MG/ML IJ SOLN: 50.0000 mg | Freq: Three times a day (TID) | INTRAMUSCULAR | Status: DC | PRN

## 2022-11-17 MED ORDER — ALUM & MAG HYDROXIDE-SIMETH 200-200-20 MG/5ML PO SUSP: 30.0000 mL | ORAL | Status: DC | PRN

## 2022-11-17 MED ORDER — HALOPERIDOL 5 MG PO TABS: 5.0000 mg | ORAL_TABLET | Freq: Three times a day (TID) | ORAL | Status: DC | PRN

## 2022-11-17 MED ORDER — ADULT MULTIVITAMIN W/MINERALS CH
1.0000 | ORAL_TABLET | Freq: Every day | ORAL | Status: DC
  Administered 2022-11-17 – 2022-11-21 (×5): 1 via ORAL
  Filled 2022-11-17 (×8): qty 1

## 2022-11-17 MED ORDER — ACETAMINOPHEN 325 MG PO TABS
650.0000 mg | ORAL_TABLET | Freq: Four times a day (QID) | ORAL | Status: DC | PRN
  Administered 2022-11-17: 650 mg via ORAL
  Filled 2022-11-17: qty 2

## 2022-11-17 MED ORDER — LORAZEPAM 1 MG PO TABS: 2.0000 mg | ORAL_TABLET | Freq: Three times a day (TID) | ORAL | Status: DC | PRN

## 2022-11-17 MED ORDER — HALOPERIDOL LACTATE 5 MG/ML IJ SOLN: 5.0000 mg | Freq: Three times a day (TID) | INTRAMUSCULAR | Status: DC | PRN

## 2022-11-17 MED ORDER — DIPHENHYDRAMINE HCL 25 MG PO CAPS: 50.0000 mg | ORAL_CAPSULE | Freq: Three times a day (TID) | ORAL | Status: DC | PRN

## 2022-11-17 MED ORDER — MAGNESIUM HYDROXIDE 400 MG/5ML PO SUSP: 30.0000 mL | Freq: Every day | ORAL | Status: DC | PRN

## 2022-11-17 MED ORDER — SERTRALINE HCL 50 MG PO TABS
50.0000 mg | ORAL_TABLET | Freq: Every day | ORAL | Status: DC
  Administered 2022-11-17 – 2022-11-19 (×3): 50 mg via ORAL
  Filled 2022-11-17 (×6): qty 1

## 2022-11-17 MED ORDER — TRAZODONE HCL 50 MG PO TABS
50.0000 mg | ORAL_TABLET | Freq: Every evening | ORAL | Status: DC | PRN
  Administered 2022-11-17: 50 mg via ORAL
  Filled 2022-11-17: qty 1

## 2022-11-17 NOTE — Progress Notes (Signed)
Patient admitted to 407-1 voluntarily from [facility] for SI and self harm. Patient denies any thoughts of suicide today. He also denies HI and AVH. Patient has many superficial cuts to his left thigh and one small cut on his chest. Skin ok otherwise. Patient reports using cigarettes, alcohol, cocaine, and marijuana. He reports drinking 2-3 drinks 2 days per week. Patient is a low fall risk and a regular diet. He denies any history of abuse. Patient reports his support system is his girlfriend. He reports he is currently homeless. Reports he lost his job after injuring his ankle. He reports he uses the bus to get around and has no trouble getting medications.  Patient oriented to the unit. Food and drink provided. 15 minute safety checks started. Safety maintained.

## 2022-11-17 NOTE — ED Notes (Signed)
Safety transport called, pt aware.

## 2022-11-17 NOTE — ED Notes (Signed)
Patient taken to yellow zone room 36 in order to have TTS consult. Yellow zone nurse aware of this. Patient notified to hit call light when done talking to TTS.

## 2022-11-17 NOTE — ED Triage Notes (Signed)
Self inflicted laceration to the lt thigh with a piece of glass  he reports that he wants to kill himself   he was at behavorial last year for a self inflicted laceration to his rt arm

## 2022-11-17 NOTE — ED Notes (Signed)
Patient returned to [facility].

## 2022-11-17 NOTE — ED Triage Notes (Signed)
The pt is not hi he did cocaine 2 houts ago and had a beer

## 2022-11-17 NOTE — ED Notes (Signed)
Pt belongings in locker 4 in purple. BH packet in triage with pt.

## 2022-11-17 NOTE — Progress Notes (Signed)
[facility] LCSW Progress Note   11/17/2022    10:58 AM  Jonathan [name]   [id number]   Type of Contact and Topic:  Psychiatric Bed Placement   Pt accepted to [facility]   Patient meets inpatient criteria per [name], NP  The attending provider will be Dr. Enedina Finner   Call report to  [phone]    Bobette Mo, RN @ [facility] ED  notified.     Pt scheduled  to arrive at [facility] TODAY. The bed is currently available.    Damita Dunnings, MSW, LCSW-A  11:00 AM 11/17/2022

## 2022-11-17 NOTE — ED Notes (Signed)
Pt report given to [facility]

## 2022-11-17 NOTE — ED Notes (Signed)
ED Provider at bedside. 

## 2022-11-17 NOTE — ED Provider Notes (Signed)
[place] EMERGENCY DEPARTMENT AT [facility] Provider Note   CSN: [id number] Arrival date & time: 11/17/22  0139     History  Chief Complaint  Patient presents with   Suicide Attempt    Jonathan [name] is a 27 y.o. male.  The history is provided by the patient and medical records.   27 y.o. M with hx of substance abuse, MDD, presenting to the ED with suicidal ideation.  States today he was feeling very down and depressed and began cutting his left inner thigh with a piece of broken glass.  He was attempting to hurt himself.  He did require inpatient psychiatric hospitalization last year for similar behaviors.  He denies any HI/AVH.  He does admit to cocaine use and light alcohol use today.  His tetanus is up-to-date.  Home Medications Prior to Admission medications   Medication Sig Start Date End Date Taking? Authorizing Provider  hydrOXYzine (ATARAX) 25 MG tablet Take 1 tablet (25 mg total) by mouth every 6 (six) hours as needed (anxiety/agitation or CIWA < or = 10). Patient not taking: Reported on 11/17/2022 07/31/22   Ardis Hughs, NP  meloxicam (MOBIC) 7.5 MG tablet Take 7.5 mg by mouth as needed for pain. Patient not taking: Reported on 11/17/2022 10/26/22   [provider]  Multiple Vitamin (MULTIVITAMIN WITH MINERALS) TABS tablet Take 1 tablet by mouth daily. Patient not taking: Reported on 11/17/2022 07/31/22   Ardis Hughs, NP  sertraline (ZOLOFT) 25 MG tablet Take 3 tablets (75 mg total) by mouth daily. Patient not taking: Reported on 11/17/2022 08/01/22   Ardis Hughs, NP  traZODone (DESYREL) 50 MG tablet Take 1 tablet (50 mg total) by mouth at bedtime as needed for sleep. Patient not taking: Reported on 11/17/2022 07/31/22   Ardis Hughs, NP      Allergies    Patient has no known allergies.    Review of Systems   Review of Systems  Psychiatric/Behavioral:  Positive for self-injury and suicidal ideas.   All other systems reviewed  and are negative.   Physical Exam Updated Vital Signs BP 111/76 (BP Location: Right Arm)   Pulse 75   Temp 98.5 F (36.9 C) (Oral)   Resp 16   Ht 5\' 8"  (1.727 m)   Wt 76.2 kg   SpO2 99%   BMI 25.54 kg/m  Physical Exam Vitals and nursing note reviewed.  Constitutional:      Appearance: He is well-developed.  HENT:     Head: Normocephalic and atraumatic.  Eyes:     Conjunctiva/sclera: Conjunctivae normal.     Pupils: Pupils are equal, round, and reactive to light.  Cardiovascular:     Rate and Rhythm: Normal rate and regular rhythm.     Heart sounds: Normal heart sounds.  Pulmonary:     Effort: Pulmonary effort is normal.     Breath sounds: Normal breath sounds.  Abdominal:     General: Bowel sounds are normal.     Palpations: Abdomen is soft.  Musculoskeletal:        General: Normal range of motion.     Cervical back: Normal range of motion.     Comments: Superficial abrasions noted to left medial thigh, there is no active bleeding or signs of infection  Skin:    General: Skin is warm and dry.  Neurological:     Mental Status: He is alert and oriented to person, place, and time.  Psychiatric:  Thought Content: Thought content includes suicidal ideation. Thought content includes suicidal plan.     ED Results / Procedures / Treatments   Labs (all labs ordered are listed, but only abnormal results are displayed) Labs Reviewed  COMPREHENSIVE METABOLIC PANEL - Abnormal; Notable for the following components:      Result Value   Creatinine, Ser 1.36 (*)    Alkaline Phosphatase 30 (*)    All other components within normal limits  ETHANOL - Abnormal; Notable for the following components:   Alcohol, Ethyl (B) 39 (*)    All other components within normal limits  SALICYLATE LEVEL - Abnormal; Notable for the following components:   Salicylate Lvl <7.0 (*)    All other components within normal limits  ACETAMINOPHEN LEVEL - Abnormal; Notable for the following  components:   Acetaminophen (Tylenol), Serum <10 (*)    All other components within normal limits  RAPID URINE DRUG SCREEN, HOSP PERFORMED - Abnormal; Notable for the following components:   Cocaine POSITIVE (*)    Tetrahydrocannabinol POSITIVE (*)    All other components within normal limits  CBC    EKG None  Radiology No results found.  Procedures Procedures    Medications Ordered in ED Medications - No data to display  ED Course/ Medical Decision Making/ A&P                                 Medical Decision Making  27 year old male presenting to the ED with suicidal ideation.  Has been self mutilating today with broken glass.  He has superficial wounds to left medial thigh, no active bleeding.  No signs of infection.  Tetanus is up-to-date.  None of these wounds require formal repair.  Labs as above, grossly reassuring.  UDS is positive for cocaine and THC.  Ethanol 39.  Medically cleared.  Will get TTS evaluation.  TTS has evaluated, recommends inpatient treatment.  They will seek placement.  Final Clinical Impression(s) / ED Diagnoses Final diagnoses:  Suicidal ideation  Self-mutilation    Rx / DC Orders ED Discharge Orders     None         Garlon Hatchet, PA-C 11/17/22 0612    Nira Conn, MD 11/17/22 [phone]

## 2022-11-17 NOTE — BH Assessment (Signed)
Comprehensive Clinical Assessment (CCA) Note  11/17/2022 Jonathan [name] [id number]  DISPOSITION: Gave clinical report to [name], NP who determined Pt meets criteria for inpatient psychiatric treatment. AC at [name] will review for possible admission. Notified [name], PA-C and [name], Rn of recommendation via secure message.  The patient demonstrates the following risk factors for suicide: Chronic risk factors for suicide include: psychiatric disorder of major depressive disorder, substance use disorder, previous suicide attempts by cutting his srist, and previous self-harm by cutting . Acute risk factors for suicide include: family or marital conflict, unemployment, social withdrawal/isolation, and loss (financial, interpersonal, professional). Protective factors for this patient include: responsibility to others (children, family). Considering these factors, the overall suicide risk at this point appears to be high. Patient is not appropriate for outpatient follow up.  Pt is a 27 year old single male who presents unaccompanied to [facility] ED after cutting his left inner thigh with a piece of broken glass. Per medical record, Pt has a diagnosis of major depressive disorder and history of substance use. He reports feelings "depressed and stressed" and acknowledges symptoms including crying spells, social withdrawal, loss of interest in usual pleasures, fatigue, irritability, decreased concentration, decreased sleep, decreased appetite and feelings of guilt, worthlessness and hopelessness. He acknowledges suicidal ideation this evening with no intent. Pt acknowledges one previous suicide attempt in October 2023 by cutting his wrist. Pt denies cutting his thigh tonight was a suicide attempt. He denies current homicidal ideation or history of violence. He denies any history of auditory or visual hallucinations. He denies delusional thought content.  Pt reports inhaling approximately 1  gram of cocaine 3-4 times per week, with last use shortly before coming to [facility]. He states he drinks 8-16 ounce of liquor approximately once per week. He says he smokes marijuana a couple of times a month. He denies other substance use.  Pt identifies several stressors and states, "Life is just not working out for me." He gives brief responses to questions. He says he is currently homeless and unemployed. He cannot identify and family or friends he considers supportive. He has a five-year-old son that he rarely is allowed to see. He reports a history of crack cocaine addiction in his biological mother.  He denies any other mental health conditions in his family.  He denies any completed suicides in his family.  He reports that his mother died in December 03, 2017. He denies history of abuse or trauma. He denies current legal problems. He denies access to firearms.   Pt says he has no outpatient mental health providers and is not taking any psychiatric medication. He has been psychiatrically hospitalized 3 times at [facility] for major depressive disorder and substance use, most recently in [place] 2021/12/03 after he cut his wrist in a suicide attempt. He was in continuous assessment at [facility] in April 2024.  Pt is dressed in hospital scrubs and initially has a blanket over his head. He is drowsy and oriented x4. Pt speaks in a clear tone, at moderate volume and normal pace. Motor behavior appears normal. Eye contact is fleeting. Pt's mood is depressed and affect is blunted. Thought process is coherent and relevant. There is no indication he is currently responding to internal stimuli or experiencing delusional thought content. Pt says he believes he needs to be psychiatrically hospitalized at this time.   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 Self  What Is the Reason for Your Visit/Call Today? Pt has diagnosis of MDD and reports feeling severely depressed. He reports suicidal thoughts tonight and cut his inner left thigh with a broken piece of glass. He acknowledges using alcohol and cocaine tonight. He is homeless, unemployed, and has no social supports.  How Long Has This Been Causing You Problems? > than 6 months  What Do You Feel Would Help You the Most Today? Alcohol or Drug Use Treatment; Treatment for Depression or other mood problem; Medication(s)   Have You Recently Had Any Thoughts About Hurting Yourself? Yes  Are You Planning to Commit Suicide/Harm Yourself At This time? No   Flowsheet Row ED from 11/17/2022 in [facility] Emergency Department at [facility] ED from 10/07/2022 in [facility] Emergency Department at [facility] ED from 08/16/2022 in [facility] Emergency Department at [facility]  C-SSRS RISK CATEGORY High Risk No Risk No Risk       Have you Recently Had Thoughts About Hurting Someone [name]? No  Are You Planning to Harm Someone at This Time? No  Explanation: Pt reports suicidal thoughts tonight and cut left thigh. Hed denies current intent to kill himself. He denies homicidal ideation.   Have You Used Any Alcohol or Drugs in the Past 24 Hours? Yes  What Did You Use and How Much? Pt reports drinking 1 can of beer and inhaling 1 gram of cocaine.   Do You Currently Have a Therapist/Psychiatrist? No  Name of Therapist/Psychiatrist: Name of Therapist/Psychiatrist: No current mental health providers   Have You Been Recently Discharged From Any Office Practice or Programs? No  Explanation of Discharge From Practice/Program: Pt was discharged from [facility] in April 2024     CCA Screening Triage Referral Assessment Type of  Contact: Tele-Assessment  Telemedicine Service Delivery: Telemedicine service delivery: This service was provided via telemedicine using a 2-way, interactive audio and video technology  Is this Initial or Reassessment? Is this Initial or Reassessment?: Initial Assessment  Date Telepsych consult ordered in [facility]:  Date Telepsych consult ordered in [facility]: 11/17/22  Time Telepsych consult ordered in [facility]:  Time Telepsych consult ordered in [facility]: 0433  Location of Assessment: [facility] ED  Provider Location: [facility] Assessment Services   Collateral Involvement: None   Does Patient Have a Automotive engineer Guardian? No  Legal Guardian Contact Information: Pt does not have a legal guardian  Copy of Legal Guardianship Form: -- (Pt does not have a legal guardian)  Legal Guardian Notified of Arrival: -- (Pt does not have a legal guardian)  Legal Guardian Notified of Pending Discharge: -- (Pt does not have a legal guardian)  If Minor and Not Living with Parent(s), Who has Custody? Pt is an adult  Is CPS involved or ever been involved? Never  Is APS involved or ever been involved? Never   Patient Determined To Be At Risk for Harm To Self or Others Based on Review of Patient Reported Information or Presenting Complaint? Yes, for Self-Harm (Pt reports suicidal thoughts tonight and cut left thigh. Hed denies current intent to kill himself. He denies homicidal ideation.)  Method: Plan without intent (Pt reports suicidal thoughts tonight and cut left thigh. Hed denies current intent to kill himself. He denies homicidal ideation.)  Availability  of Means: Has close by (Pt reports suicidal thoughts tonight and cut left thigh. Hed denies current intent to kill himself. He denies homicidal ideation.)  Intent: Intends to cause physical harm but not necessarily death (Pt reports suicidal thoughts tonight and cut left thigh. Hed denies current intent to kill himself. He denies homicidal  ideation.)  Notification Required: No need or identified person  Additional Information for Danger to Others Potential: -- (Pt denies history of aggression)  Additional Comments for Danger to Others Potential: Pt denies history of aggression  Are There Guns or Other Weapons in Your Home? No  Types of Guns/Weapons: Pt denies access to firearms  Are These Weapons Safely Secured?                            -- (Pt denies access to firearms)  Who Could Verify You Are Able To Have These Secured: Pt denies access to firearms  Do You Have any Outstanding Charges, Pending Court Dates, Parole/Probation? Pt denies current legal problems.  Contacted To Inform of Risk of Harm To Self or Others: Unable to Contact:    Does Patient Present under Involuntary Commitment? No    [place] of Residence: [place]   Patient Currently Receiving the Following Services: Not Receiving Services   Determination of Need: Emergent (2 hours)   Options For Referral: Inpatient Hospitalization; [facility]; Medication Management     CCA Biopsychosocial Patient Reported Schizophrenia/Schizoaffective Diagnosis in Past: No   Strengths: Pt is motivated for treatment   Mental Health Symptoms Depression:   Change in energy/activity; Difficulty Concentrating; Fatigue; Hopelessness; Increase/decrease in appetite; Irritability; Worthlessness; Tearfulness; Sleep (too much or little)   Duration of Depressive symptoms:  Duration of Depressive Symptoms: Greater than two weeks   Mania:   None   Anxiety:    Worrying; Tension; Sleep; Irritability; Fatigue; Difficulty concentrating   Psychosis:   None   Duration of Psychotic symptoms:    Trauma:   None   Obsessions:   None   Compulsions:   None   Inattention:   None   Hyperactivity/Impulsivity:   None   Oppositional/Defiant Behaviors:   None   Emotional Irregularity:   None   Other Mood/Personality Symptoms:   Pt is diagnosed with  major depressive disorder    Mental Status Exam Appearance and self-care  Stature:   Average   Weight:   Average weight   Clothing:   -- (Scrubs)   Grooming:   Normal   Cosmetic use:   None   Posture/gait:   Normal   Motor activity:   Not Remarkable   Sensorium  Attention:   Normal   Concentration:   Normal   Orientation:   X5   Recall/memory:   Normal   Affect and Mood  Affect:   Blunted   Mood:   Depressed   Relating  Eye contact:   Fleeting   Facial expression:   Depressed   Attitude toward examiner:   Cooperative   Thought and Language  Speech flow:  Normal; Paucity   Thought content:   Appropriate to Mood and Circumstances   Preoccupation:   None   Hallucinations:   None   Organization:   Coherent   [employer] of Knowledge:   Average   Intelligence:   Average   Abstraction:   Normal   Judgement:   Fair   Reality Testing:   Adequate   Insight:  Lacking   Decision Making:   Normal   Social Functioning  Social Maturity:   Isolates   Social Judgement:   Normal   Stress  Stressors:   Family conflict; Grief/losses; Housing; Surveyor, quantity; Work   Coping Ability:   Human resources officer Deficits:   None   Supports:   Support needed     Religion: Religion/Spirituality Are You A Religious Person?: No How Might This Affect Treatment?: NA  Leisure/Recreation: Leisure / Recreation Do You Have Hobbies?: No  Exercise/Diet: Exercise/Diet Do You Exercise?: No Have You Gained or Lost A Significant Amount of Weight in the Past Six Months?: No Do You Follow a Special Diet?: No Do You Have Any Trouble Sleeping?: Yes Explanation of Sleeping Difficulties: Pt reports poor sleep   CCA Employment/Education Employment/Work Situation: Employment / Work Situation Employment Situation: Unemployed Patient's Job has Been Impacted by Current Illness: No Has Patient ever Been in Equities trader?:  No  Education: Education Is Patient Currently Attending School?: No Last Grade Completed: 11 (GED) Did You Attend College?: No Did You Have An Individualized Education Program (IIEP): No Did You Have Any Difficulty At School?: No Patient's Education Has Been Impacted by Current Illness: No   CCA Family/Childhood History Family and Relationship History: Family history Marital status: Single Does patient have children?: Yes How many children?: 1 How is patient's relationship with their children?: Pt has little contact with his 53 year old son  Childhood History:  Childhood History By whom was/is the patient raised?: Father Did patient suffer any verbal/emotional/physical/sexual abuse as a child?: No Did patient suffer from severe childhood neglect?: No Has patient ever been sexually abused/assaulted/raped as an adolescent or adult?: No Was the patient ever a victim of a crime or a disaster?: No Witnessed domestic violence?: No Has patient been affected by domestic violence as an adult?: No       CCA Substance Use Alcohol/Drug Use: Alcohol / Drug Use Pain Medications: Denies abuse Prescriptions: Denies abuse Over the Counter: Denies abuse History of alcohol / drug use?: Yes Longest period of sobriety (when/how long): Pt unable to estimate Negative Consequences of Use: Financial Withdrawal Symptoms: None Substance #1 Name of Substance 1: Cocaine 1 - Age of First Use: 25 1 - Amount (size/oz): 1 gram 1 - Frequency: 3-4 times per week 1 - Duration: Ongoing 1 - Last Use / Amount: 11/17/2022, 1 gram 1 - Method of Aquiring: Unknown 1- Route of Use: Powder inhalation Substance #2 Name of Substance 2: Alcohol 2 - Age of First Use: 17 2 - Amount (size/oz): 8-16 ounces of liquor 2 - Frequency: Approximately once per week 2 - Duration: Ongoing 2 - Last Use / Amount: 11/17/2022, 1 can of beer 2 - Method of Aquiring: Store purchase 2 - Route of Substance Use: Oral  ingestion Substance #3 Name of Substance 3: Marijuana 3 - Age of First Use: 14 3 - Amount (size/oz): Variies 3 - Frequency: 2-3 times per month 3 - Duration: Ongoing 3 - Last Use / Amount: 11/15/2022 3 - Method of Aquiring: Unknown 3 - Route of Substance Use: Smoke inhalation                   ASAM's:  Six Dimensions of Multidimensional Assessment  Dimension 1:  Acute Intoxication and/or Withdrawal Potential:   Dimension 1:  Description of individual's past and current experiences of substance use and withdrawal: Pt reports use of alcohol, cocaine, and marijuana  Dimension 2:  Biomedical Conditions and  Complications:   Dimension 2:  Description of patient's biomedical conditions and  complications: None  Dimension 3:  Emotional, Behavioral, or Cognitive Conditions and Complications:  Dimension 3:  Description of emotional, behavioral, or cognitive conditions and complications: Pt has diagnosis of MDD and reports feeling severely depressed  Dimension 4:  Readiness to Change:  Dimension 4:  Description of Readiness to Change criteria: Pt says he wants to stop using substances  Dimension 5:  Relapse, Continued use, or Continued Problem Potential:  Dimension 5:  Relapse, continued use, or continued problem potential critiera description: Pt has brief periods of sobriety  Dimension 6:  Recovery/Living Environment:  Dimension 6:  Recovery/Iiving environment criteria description: Pt is homeless  ASAM Severity Score: ASAM's Severity Rating Score: 10  ASAM Recommended Level of Treatment: ASAM Recommended Level of Treatment: Level III Residential Treatment   Substance use Disorder (SUD) Substance Use Disorder (SUD)  Checklist Symptoms of Substance Use: Continued use despite having a persistent/recurrent physical/psychological problem caused/exacerbated by use, Continued use despite persistent or recurrent social, interpersonal problems, caused or exacerbated by use, Persistent desire or  unsuccessful efforts to cut down or control use, Substance(s) often taken in larger amounts or over longer times than was intended  Recommendations for Services/Supports/Treatments: Recommendations for Services/Supports/Treatments Recommendations For Services/Supports/Treatments: CD-IOP Intensive Chemical Dependency Program  Discharge Disposition: Discharge Disposition Medical Exam completed: Yes  DSM5 Diagnoses: Patient Active Problem List   Diagnosis Date Noted   MDD (major depressive disorder), recurrent episode (HCC) 07/30/2022   Alcohol use disorder 01/20/2022   Delta-9-tetrahydrocannabinol (THC) dependence (HCC) 01/20/2022   Cocaine use disorder (HCC) 01/20/2022   Pneumonia 05/23/2017   MDD (major depressive disorder), recurrent severe, without psychosis (HCC) 05/22/2017     Referrals to Alternative Service(s): Referred to Alternative Service(s):   Place:   Date:   Time:    Referred to Alternative Service(s):   Place:   Date:   Time:    Referred to Alternative Service(s):   Place:   Date:   Time:    Referred to Alternative Service(s):   Place:   Date:   Time:     Pamalee Leyden, [facility]

## 2022-11-17 NOTE — Progress Notes (Signed)
   11/17/22 2111  Psych Admission Type (Psych Patients Only)  Admission Status Voluntary  Psychosocial Assessment  Patient Complaints Anxiety;Depression;Sleep disturbance  Eye Contact Fair  Facial Expression Animated  Affect Anxious  Speech Logical/coherent  Interaction Assertive  Motor Activity Other (Comment) (WNL)  Appearance/Hygiene In scrubs  Behavior Characteristics Cooperative;Appropriate to situation  Mood Depressed;Anxious  Thought Process  Coherency WDL  Content WDL  Delusions None reported or observed  Perception WDL  Hallucination None reported or observed  Judgment Impaired  Confusion None  Danger to Self  Current suicidal ideation? Denies  Agreement Not to Harm Self Yes  Description of Agreement verbal  Danger to Others  Danger to Others None reported or observed

## 2022-11-17 NOTE — Tx Team (Signed)
Initial Treatment Plan 11/17/2022 2:04 PM Jonathan [name] [id number]    PATIENT STRESSORS: Financial difficulties   Occupational concerns   Substance abuse     PATIENT STRENGTHS: General fund of knowledge  Motivation for treatment/growth    PATIENT IDENTIFIED PROBLEMS: "Depression"  "Anxiety"                   DISCHARGE CRITERIA:  Improved stabilization in mood, thinking, and/or behavior Need for constant or close observation no longer present  PRELIMINARY DISCHARGE PLAN: Return to previous living arrangement  PATIENT/FAMILY INVOLVEMENT: This treatment plan has been presented to and reviewed with the patient, Jonathan [name].  The patient has been given the opportunity to ask questions and make suggestions.  Edwyna Perfect, RN 11/17/2022, 2:04 PM

## 2022-11-17 NOTE — ED Provider Notes (Signed)
Emergency Medicine Observation Re-evaluation Note  Jonathan [name] is a 27 y.o. male, seen on rounds today.  Pt initially presented to the ED for complaints of substance use disorder and housing instability Currently, the patient is sleeping.  Physical Exam  BP 111/76 (BP Location: Right Arm)   Pulse 75   Temp 98.5 F (36.9 C) (Oral)   Resp 16   Ht 5\' 8"  (1.727 m)   Wt 76.2 kg   SpO2 99%   BMI 25.54 kg/m  Physical Exam General: Sleeping Cardiac: Extremities perfused Lungs: Breathing is unlabored Psych: Deferred  ED Course / MDM  EKG:   I have reviewed the labs performed to date as well as medications administered while in observation.  Recent changes in the last 24 hours include accepted to [facility].  Plan  Current plan is for admission to [facility].    Gloris Manchester, MD 11/17/22 [phone]

## 2022-11-17 NOTE — Progress Notes (Signed)
Psychoeducational Group Note  Date:  11/17/2022 Time:  2257  Group Topic/Focus:  Wrap-Up Group:   The focus of this group is to help patients review their daily goal of treatment and discuss progress on daily workbooks.  Participation Level: Did Not Attend  Participation Quality:  Not Applicable  Affect:  Not Applicable  Cognitive:  Not Applicable  Insight:  Not Applicable  Engagement in Group: Not Applicable  Additional Comments:  The patient did not attend group this evening.   Hazle Coca S 11/17/2022, 10:57 PM

## 2022-11-18 DIAGNOSIS — F1010 Alcohol abuse, uncomplicated: Secondary | ICD-10-CM | POA: Diagnosis present

## 2022-11-18 DIAGNOSIS — F332 Major depressive disorder, recurrent severe without psychotic features: Secondary | ICD-10-CM | POA: Diagnosis not present

## 2022-11-18 DIAGNOSIS — G4700 Insomnia, unspecified: Secondary | ICD-10-CM | POA: Diagnosis present

## 2022-11-18 MED ORDER — TRAZODONE HCL 50 MG PO TABS
50.0000 mg | ORAL_TABLET | Freq: Every day | ORAL | Status: DC
  Administered 2022-11-18 – 2022-11-20 (×3): 50 mg via ORAL
  Filled 2022-11-18 (×7): qty 1

## 2022-11-18 MED ORDER — LORAZEPAM 1 MG PO TABS: 1.0000 mg | ORAL_TABLET | Freq: Four times a day (QID) | ORAL | Status: DC | PRN

## 2022-11-18 MED ORDER — NICOTINE 21 MG/24HR TD PT24
21.0000 mg | MEDICATED_PATCH | Freq: Every day | TRANSDERMAL | Status: DC
  Administered 2022-11-19: 21 mg via TRANSDERMAL
  Filled 2022-11-18 (×5): qty 1

## 2022-11-18 MED ORDER — THIAMINE HCL 100 MG/ML IJ SOLN: 100.0000 mg | Freq: Once | INTRAMUSCULAR | Status: DC

## 2022-11-18 MED ORDER — NICOTINE POLACRILEX 2 MG MT GUM: 2.0000 mg | CHEWING_GUM | OROMUCOSAL | Status: DC | PRN

## 2022-11-18 MED ORDER — HYDROXYZINE HCL 25 MG PO TABS
25.0000 mg | ORAL_TABLET | Freq: Three times a day (TID) | ORAL | Status: DC | PRN
  Administered 2022-11-18 – 2022-11-20 (×3): 25 mg via ORAL
  Filled 2022-11-18 (×3): qty 1

## 2022-11-18 MED ORDER — ONDANSETRON 4 MG PO TBDP: 4.0000 mg | ORAL_TABLET | Freq: Four times a day (QID) | ORAL | Status: DC | PRN

## 2022-11-18 MED ORDER — VITAMIN B-1 100 MG PO TABS
100.0000 mg | ORAL_TABLET | Freq: Every day | ORAL | Status: DC
  Administered 2022-11-19 – 2022-11-21 (×3): 100 mg via ORAL
  Filled 2022-11-18 (×5): qty 1

## 2022-11-18 MED ORDER — LOPERAMIDE HCL 2 MG PO CAPS: 2.0000 mg | ORAL_CAPSULE | ORAL | Status: DC | PRN

## 2022-11-18 NOTE — [facility] Suicide Risk Assessment (Signed)
Suicide Risk Assessment  Admission Assessment    [facility] Admission Suicide Risk Assessment   Nursing information obtained from:  Patient Demographic factors:  Male, Low socioeconomic status, Unemployed Current Mental Status:  Suicidal ideation indicated by patient Loss Factors:  Financial problems / change in socioeconomic status Historical Factors:  Impulsivity Risk Reduction Factors:  NA  Total Time spent with patient: 1.5 hours Principal Problem: MDD (major depressive disorder), recurrent severe, without psychosis (HCC) Diagnosis:  Principal Problem:   MDD (major depressive disorder), recurrent severe, without psychosis (HCC) Active Problems:   Alcohol use disorder   Delta-9-tetrahydrocannabinol (THC) dependence (HCC)   Cocaine use disorder (HCC)   Mild alcohol use disorder   Insomnia  Reason For Admission: Jonathan [name] is a 27 yo AAM with prior mental health diagnoses of MDD, GAD & cocaine use d/o who presented to the [facility] ER on 8/17 with complaints of worsening depressive symptoms & SI. Pt also made multiple cuts to his left upper thigh area with a piece of broken glass as per his reports prior to presentation to the ER in an effort to release stress. Pt was transferred voluntarily to this [facility] for treatment and stabilization of his mental status.   Continued Clinical Symptoms: Depressive symptoms, suicidal thoughts requiring continuous hospitalization for treatment and stabilization.  Alcohol Use Disorder Identification Test Final Score (AUDIT): 4 The "Alcohol Use Disorders Identification Test", Guidelines for Use in Primary Care, Second Edition.  World Science writer [facility]). Score between 0-7:  no or low risk or alcohol related problems. Score between 8-15:  moderate risk of alcohol related problems. Score between 16-19:  high risk of alcohol related problems. Score 20 or above:  warrants further diagnostic evaluation for alcohol dependence and  treatment.  CLINICAL FACTORS:   Depression:   Anhedonia Hopelessness Impulsivity Insomnia Severe Alcohol/Substance Abuse/Dependencies More than one psychiatric diagnosis Previous Psychiatric Diagnoses and Treatments  Musculoskeletal: Strength & Muscle Tone: within normal limits Gait & Station: normal Patient leans: N/A  Psychiatric Specialty Exam:  Presentation  General Appearance:  Appropriate for Environment; Casual; Fairly Groomed  Eye Contact: Good  Speech: Clear and Coherent  Speech Volume: Normal  Handedness: Right  Mood and Affect  Mood: Depressed; Anxious  Affect: Congruent  Thought Process  Thought Processes: Coherent  Descriptions of Associations:Intact  Orientation:Full (Time, Place and Person)  Thought Content:Logical  History of Schizophrenia/Schizoaffective disorder:No  Duration of Psychotic Symptoms:No data recorded Hallucinations:Hallucinations: None  Ideas of Reference:None  Suicidal Thoughts:Suicidal Thoughts: No  Homicidal Thoughts:Homicidal Thoughts: No  Sensorium  Memory: Immediate Good  Judgment: Fair  Insight: Good  Executive Functions  Concentration: Good  Attention Span: Good  Recall: Good  Fund of Knowledge: Good  Language: Good  Psychomotor Activity  Psychomotor Activity: Psychomotor Activity: Normal  Assets  Assets: Communication Skills; Resilience  Sleep  Sleep: Sleep: Poor  Physical Exam: Physical Exam Constitutional:      Appearance: Normal appearance.  Musculoskeletal:        General: Normal range of motion.     Cervical back: Normal range of motion.  Neurological:     Mental Status: He is alert.    Review of Systems  Constitutional: Negative.   HENT: Negative.    Eyes: Negative.   Respiratory: Negative.    Cardiovascular: Negative.   Gastrointestinal:  Positive for heartburn.  Genitourinary:  Negative for dysuria.  Musculoskeletal:  Negative for myalgias.  Skin:  Negative.   Neurological:  Negative for dizziness.  Psychiatric/Behavioral:  Positive for depression  and substance abuse. Negative for hallucinations, memory loss and suicidal ideas. The patient is nervous/anxious and has insomnia.    Blood pressure 123/87, pulse 72, temperature 97.7 F (36.5 C), temperature source Oral, resp. rate 16, height 5\' 8"  (1.727 m), weight 74.5 kg, SpO2 97%. Body mass index is 24.97 kg/m.  COGNITIVE FEATURES THAT CONTRIBUTE TO RISK:  None    SUICIDE RISK:    Moderate:  Frequent suicidal ideation with limited intensity, and duration, some specificity in terms of plans, no associated intent, good self-control, limited dysphoria/symptomatology, some risk factors present, and identifiable protective factors, including available and accessible social support.   PLAN OF CARE: See H & P  I certify that inpatient services furnished can reasonably be expected to improve the patient's condition.   Starleen Blue, NP 11/18/2022, 4:46 PM

## 2022-11-18 NOTE — Progress Notes (Signed)
   11/18/22 1100  Psych Admission Type (Psych Patients Only)  Admission Status Voluntary  Psychosocial Assessment  Patient Complaints None  Eye Contact Fair  Facial Expression Animated  Affect Anxious  Speech Logical/coherent  Interaction Assertive  Motor Activity Other (Comment) (WNL)  Appearance/Hygiene Unremarkable  Behavior Characteristics Appropriate to situation;Cooperative  Mood Pleasant  Thought Process  Coherency WDL  Content WDL  Delusions None reported or observed  Perception WDL  Hallucination None reported or observed  Judgment Impaired  Confusion None  Danger to Self  Current suicidal ideation? Denies  Agreement Not to Harm Self Yes  Description of Agreement verbal  Danger to Others  Danger to Others None reported or observed
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
## 2022-11-18 NOTE — Plan of Care (Signed)
  Problem: Education: Goal: Knowledge of [facility] Education information/materials will improve Outcome: Progressing Goal: Emotional status will improve Outcome: Progressing Goal: Mental status will improve Outcome: Progressing Goal: Verbalization of understanding the information provided will improve Outcome: Progressing   

## 2022-11-18 NOTE — [facility] Group Notes (Signed)
[facility] Group Notes:  (Nursing/MHT/Case Management/Adjunct)  Date:  11/18/2022  Time:  10:35 PM  Type of Therapy:   Group Wrap Up  Participation Level:  Active  Participation Quality:  Appropriate, Sharing, and Supportive  Affect:  Appropriate  Cognitive:  Alert and Appropriate  Insight:  Appropriate and Good  Engagement in Group:  Developing/Improving, Engaged, and Supportive  Modes of Intervention:  Socialization and Support  Summary of Progress/Problems:Pt attended group   Granville Lewis 11/18/2022, 10:35 PM

## 2022-11-18 NOTE — B[facility] Counselor (Signed)
Adult Comprehensive Assessment  Patient ID: Ercell Beshara, male   DOB: [birth year], 28 y.o.   MRN: [id number]  Information Source: Information source: Patient  Current Stressors:  Patient states their primary concerns and needs for treatment are:: Patient stated that he cuts himself due to stress and depression Patient states their goals for this hospitilization and ongoing recovery are:: Patient states that he wants to work on depression, stress and anxiety Educational / Learning stressors: none reported Employment / Job issues: not employ Family Relationships: none reported Surveyor, quantity / Lack of resources (include bankruptcy): Patient states no money is coming in and lost his job at [employer], "ankle injury" Housing / Lack of housing: client is homeless Physical health (include injuries & life threatening diseases): Patient fell and dislocated his right ankle Social relationships: none reported Substance abuse: alcohol and cocaine leads to his mental states Bereavement / Loss: none reported  Living/Environment/Situation:  Living Arrangements: Other (Comment) (client is homeless, stays outside, [facility] at times) Living conditions (as described by patient or guardian): Chaotic, and dangrous Who else lives in the home?: NA How long has patient lived in current situation?: 4 months What is atmosphere in current home: Dangerous, Chaotic  Family History:  Marital status: Single Are you sexually active?: No What is your sexual orientation?: Heterosexual Has your sexual activity been affected by drugs, alcohol, medication, or emotional stress?: none reported Does patient have children?: Yes (1 child- boy) How many children?: 1 How is patient's relationship with their children?: no communication with child, child is with mother  Childhood History:  By whom was/is the patient raised?: Father Description of patient's relationship with caregiver when they were a child: "we don't have a  relationship" Patient's description of current relationship with people who raised him/her: "we don't speak", no communication How were you disciplined when you got in trouble as a child/adolescent?: whipping sometimes Does patient have siblings?: Yes Number of Siblings: 4 (3 sisters and 1 brother) Description of patient's current relationship with siblings: none reported Did patient suffer any verbal/emotional/physical/sexual abuse as a child?: No Did patient suffer from severe childhood neglect?: No Has patient ever been sexually abused/assaulted/raped as an adolescent or adult?: No Was the patient ever a victim of a crime or a disaster?: No Witnessed domestic violence?: No Has patient been affected by domestic violence as an adult?: No  Education:  Highest grade of school patient has completed: GED Currently a Consulting civil engineer?: No Learning disability?: No  Employment/Work Situation:   Employment Situation: Unemployed Patient's Job has Been Impacted by Current Illness: No What is the Longest Time Patient has Held a Job?: 6 months Where was the Patient Employed at that Time?: [employer] Has Patient ever Been in the [employer]?: No  Financial Resources:   Financial resources: No income Does patient have a Lawyer or guardian?: No  Alcohol/Substance Abuse:   What has been your use of drugs/alcohol within the last 12 months?: alcohol/ cocaine If attempted suicide, did drugs/alcohol play a role in this?: Yes Alcohol/Substance Abuse Treatment Hx: Denies past history Has alcohol/substance abuse ever caused legal problems?: No  Social Support System:   Forensic psychologist System: None Describe Community Support System: None reported Type of faith/religion: none reported How does patient's faith help to cope with current illness?: none reported  Leisure/Recreation:   Do You Have Hobbies?: Yes Leisure and Hobbies: read and watch TV  Strengths/Needs:   What is the  patient's perception of their strengths?: Patient states that he is kind  Patient states they can use these personal strengths during their treatment to contribute to their recovery: "try my best not to stress on things I don't have controle over." Patient states these barriers may affect/interfere with their treatment: Patient states that stress and "listening to other that don't understand what I am going through" Patient states these barriers may affect their return to the community: The depression and anxiety level is high when he is in the community Other important information patient would like considered in planning for their treatment: Need case management for housing and transportation  Discharge Plan:   Currently receiving community mental health services: No Patient states concerns and preferences for aftercare planning are: Patient is concerns about recieving medication, " I don't have the fund to pay for it." Patient states they will know when they are safe and ready for discharge when: Patient states that he doesn't know, " I guess when they say I am ready to go." Does patient have access to transportation?: Yes (bus) Does patient have financial barriers related to discharge medications?: Yes Patient description of barriers related to discharge medications: Client states that he does not have funding for medication, "I don't know if my medicaid still active." Plan for living situation after discharge: Client would like to go to the shelter Will patient be returning to same living situation after discharge?: No  Summary/Recommendations:   Summary and Recommendations (to be completed by the evaluator): Ludwik Daniels is a 27 y.o. male was admitted to the ED at [facility] and was transfer to [facility] for depression and stress. Patients reports stressors to include not having a place to stay and financial and anticipating going back to being homeless. Patients states he does not have social support,  or family contact. Patient was let go from work due to right ankle injury from a fall, which terminated his only income that he has. Patient states that drinking alcohol and using cocaine impacts his depression and stress level. Patient reported that he does not receive community resources. Patient will benefit from crisis stabilization, medication evaluation, group therapy and psychoeducation, in addition to case management for discharge planning. At discharge it is recommended that Patient adhere to the established discharge plan and continue in treatment.  Farrel Guimond O Jakala Herford. 11/18/2022

## 2022-11-18 NOTE — [facility] Group Notes (Signed)
Pt did not attend goals group. 

## 2022-11-18 NOTE — Progress Notes (Signed)
   11/18/22 2100  Psych Admission Type (Psych Patients Only)  Admission Status Voluntary  Psychosocial Assessment  Patient Complaints None  Eye Contact Fair  Facial Expression Animated  Affect Anxious  Speech Logical/coherent  Interaction Assertive  Motor Activity Other (Comment) (WNL)  Appearance/Hygiene Unremarkable  Behavior Characteristics Cooperative;Appropriate to situation  Mood Anxious;Pleasant  Thought Process  Coherency WDL  Content WDL  Delusions None reported or observed  Perception WDL  Hallucination None reported or observed  Judgment Impaired  Confusion None  Danger to Self  Current suicidal ideation? Denies  Agreement Not to Harm Self Yes  Description of Agreement verbal  Danger to Others  Danger to Others None reported or observed

## 2022-11-18 NOTE — B[facility] Group Notes (Signed)
LCSW Group Therapy Note  11/18/2022   10:00-11:00am   Type of Therapy and Topic:  Group Therapy: Anger Cues and Responses  Participation Level:  Did Not Attend   Description of Group:   In this group, patients learned how to recognize the physical, cognitive, emotional, and behavioral responses they have to anger-provoking situations.  They identified a recent time they became angry and how they reacted.  They analyzed how their reaction was possibly beneficial and how it was possibly unhelpful.  The group discussed a variety of healthier coping skills that could help with such a situation in the future.  They also learned that anger is a second emotion fueled by other feelings and explored their own emotions that may frequently fuel their anger.  Focus was placed on how helpful it is to recognize the underlying emotions to our anger, because working on those can lead to a more permanent solution as well as our ability to focus on the important rather than the urgent.  Therapeutic Goals: Patients will remember their last incident of anger and how they felt emotionally and physically, what their thoughts were at the time, and how they behaved. Patients will identify how their behavior at that time worked for them, as well as how it worked against them. Patients will explore possible new behaviors to use in future anger situations. Patients will learn that anger itself is normal and cannot be eliminated, and that healthier reactions can assist with resolving conflict rather than worsening situations. Patients will learn that anger is a secondary emotion and worked to identify some of the underlying feelings that may lead to anger.  Summary of Patient Progress:  N/A  Therapeutic Modalities:   Cognitive Behavioral Therapy  [name]

## 2022-11-19 ENCOUNTER — Encounter (HOSPITAL_COMMUNITY): Payer: Self-pay

## 2022-11-19 DIAGNOSIS — F332 Major depressive disorder, recurrent severe without psychotic features: Secondary | ICD-10-CM | POA: Diagnosis not present

## 2022-11-19 MED ORDER — SERTRALINE HCL 100 MG PO TABS
100.0000 mg | ORAL_TABLET | Freq: Every day | ORAL | Status: DC
  Administered 2022-11-20 – 2022-11-21 (×2): 100 mg via ORAL
  Filled 2022-11-19 (×4): qty 1

## 2022-11-19 NOTE — Group Note (Signed)
Date:  11/19/2022 Time:  6:26 PM  Group Topic/Focus:  Goals Group:   The focus of this group is to help patients establish daily goals to achieve during treatment and discuss how the patient can incorporate goal setting into their daily lives to aide in recovery.    Participation Level:  Did Not Attend    Jonathan [name] 11/19/2022, 6:26 PM

## 2022-11-19 NOTE — Progress Notes (Signed)
[facility] MD Progress Note  11/19/2022 3:32 PM [name]  MRN:  [id number]  Principal Problem: MDD (major depressive disorder), recurrent severe, without psychosis (HCC) Diagnosis: Principal Problem:   MDD (major depressive disorder), recurrent severe, without psychosis (HCC) Active Problems:   Alcohol use disorder   Delta-9-tetrahydrocannabinol (THC) dependence (HCC)   Cocaine use disorder (HCC)   Mild alcohol use disorder   Insomnia  Reason For Admission: Jonathan [name] is a 27 yo AAM with prior mental health diagnoses of MDD, GAD & cocaine use d/o who presented to the [facility] ER on 8/17 with complaints of worsening depressive symptoms & SI. Pt also made multiple cuts to his left upper thigh area with a piece of broken glass as per his reports prior to presentation to the ER in an effort to release stress. Pt was transferred voluntarily to this [facility] for treatment and stabilization of his mental status.   24 hr chart review: Sleep Hours last night: none documented, but pt states that his sleep quality last night was good Nursing Concerns: none reported/documented Behavioral episodes in the past 24 hrs: none  Medication Compliance: compliant  Vital Signs in the past 24 hrs: WNL PRN Medications in the past 24 hrs: Hydroxyzine   Patient assessment note: On assessment today, the pt reports that their mood is less depressed as compared to time of admission, and improving. Reports that anxiety is moderate Sleep is good  Appetite is good  Concentration is average  Energy level is low  Denies suicidal thoughts. Denies suicidal intent and plan.  Denies having any HI.  Denies having psychotic symptoms.   Denies having side effects to current psychiatric medications.   We discussed changes to current medication regimen, including increasing Zoloft to 100 mg starting tomorrow, 8/20 for management of depressive symptoms, and keeping all other medications same.  Discussed the  following psychosocial stressors: substance use and homelessness. Pt is now declining rehab, states that he is no longer interested in going to one after discharge from this hospital, states that he has a cousin that he would like to go reside with after discharge. Educated on the fact that his tentative discharge date is 8/22, if symptoms continue to resolve, and if outpatient f/u appts can be secured for him. Verbalizes understanding.    Total Time spent with patient: 45 minutes  Past Psychiatric History: See H & P  Past Medical History:  Past Medical History:  Diagnosis Date   Bipolar 1 disorder (HCC)    History reviewed. No pertinent surgical history. Family History: History reviewed. No pertinent family history. Family Psychiatric  History: See H & P Social History:  Social History   Substance and Sexual Activity  Alcohol Use Yes   Alcohol/week: 2.0 standard drinks of alcohol   Types: 2 Cans of beer per week   Comment: Occ     Social History   Substance and Sexual Activity  Drug Use Not Currently   Types: Marijuana    Social History   Socioeconomic History   Marital status: Single    Spouse name: Not on file   Number of children: Not on file   Years of education: Not on file   Highest education level: Not on file  Occupational History   Not on file  Tobacco Use   Smoking status: Every Day    Current packs/day: 1.00    Types: Cigarettes   Smokeless tobacco: Never  Substance and Sexual Activity   Alcohol use: Yes  Alcohol/week: 2.0 standard drinks of alcohol    Types: 2 Cans of beer per week    Comment: Occ   Drug use: Not Currently    Types: Marijuana   Sexual activity: Yes    Birth control/protection: Condom  Other Topics Concern   Not on file  Social History Narrative   ** Merged History Encounter **       Social Determinants of Health   Financial Resource Strain: Not on file  Food Insecurity: No Food Insecurity (11/17/2022)   Hunger Vital Sign     Worried About Running Out of Food in the Last Year: Never true    Ran Out of Food in the Last Year: Never true  Transportation Needs: No Transportation Needs (11/17/2022)   PRAPARE - Administrator, Civil Service (Medical): No    Lack of Transportation (Non-Medical): No  Physical Activity: Not on file  Stress: Not on file  Social Connections: Not on file   Sleep: Good  Appetite:  Good  Current Medications: Current Facility-Administered Medications  Medication Dose Route Frequency Provider Last Rate Last Admin   acetaminophen (TYLENOL) tablet 650 mg  650 mg Oral Q6H PRN Massengill, Harrold Donath, MD   650 mg at 11/17/22 1324   alum & mag hydroxide-simeth (MAALOX/MYLANTA) 200-200-20 MG/5ML suspension 30 mL  30 mL Oral Q4H PRN Massengill, Harrold Donath, MD       diphenhydrAMINE (BENADRYL) capsule 50 mg  50 mg Oral TID PRN Massengill, Harrold Donath, MD       Or   diphenhydrAMINE (BENADRYL) injection 50 mg  50 mg Intramuscular TID PRN Massengill, Harrold Donath, MD       haloperidol (HALDOL) tablet 5 mg  5 mg Oral TID PRN Phineas Inches, MD       Or   haloperidol lactate (HALDOL) injection 5 mg  5 mg Intramuscular TID PRN Massengill, Harrold Donath, MD       hydrOXYzine (ATARAX) tablet 25 mg  25 mg Oral TID PRN Starleen Blue, NP   25 mg at 11/18/22 2124   loperamide (IMODIUM) capsule 2-4 mg  2-4 mg Oral PRN Starleen Blue, NP       LORazepam (ATIVAN) tablet 2 mg  2 mg Oral TID PRN Phineas Inches, MD       Or   LORazepam (ATIVAN) injection 2 mg  2 mg Intramuscular TID PRN Massengill, Harrold Donath, MD       LORazepam (ATIVAN) tablet 1 mg  1 mg Oral Q6H PRN Starleen Blue, NP       magnesium hydroxide (MILK OF MAGNESIA) suspension 30 mL  30 mL Oral Daily PRN Massengill, Harrold Donath, MD       multivitamin with minerals tablet 1 tablet  1 tablet Oral Daily Massengill, Nathan, MD   1 tablet at 11/19/22 0800   neomycin-bacitracin-polymyxin (NEOSPORIN) ointment   Topical BID Starleen Blue, NP   Given at 11/18/22 2124    nicotine (NICODERM CQ - dosed in mg/24 hours) patch 21 mg  21 mg Transdermal Daily Starleen Blue, NP   21 mg at 11/19/22 0801   nicotine polacrilex (NICORETTE) gum 2 mg  2 mg Oral PRN Massengill, Harrold Donath, MD       ondansetron (ZOFRAN-ODT) disintegrating tablet 4 mg  4 mg Oral Q6H PRN Starleen Blue, NP       sertraline (ZOLOFT) tablet 50 mg  50 mg Oral Daily Massengill, Nathan, MD   50 mg at 11/19/22 0800   thiamine (Vitamin B-1) tablet 100 mg  100 mg Oral Daily  Starleen Blue, NP   100 mg at 11/19/22 0800   traZODone (DESYREL) tablet 50 mg  50 mg Oral QHS Starleen Blue, NP   50 mg at 11/18/22 2124    Lab Results: No results found for this or any previous visit (from the past 48 hour(s)).  Blood Alcohol level:  Lab Results  Component Value Date   ETH 39 (H) 11/17/2022   ETH <10 07/30/2022    Metabolic Disorder Labs: Lab Results  Component Value Date   HGBA1C 5.7 (H) 07/30/2022   MPG 116.89 07/30/2022   MPG 99.67 01/20/2022   No results found for: "PROLACTIN" Lab Results  Component Value Date   CHOL 193 07/30/2022   TRIG 232 (H) 07/30/2022   HDL 37 (L) 07/30/2022   CHOLHDL 5.2 07/30/2022   VLDL 46 (H) 07/30/2022   LDLCALC 110 (H) 07/30/2022   LDLCALC 131 (H) 01/20/2022    Physical Findings: AIMS:  , ,  ,  ,    CIWA:  CIWA-Ar Total: 0 COWS:     Musculoskeletal: Strength & Muscle Tone: within normal limits Gait & Station: normal Patient leans: N/A  Psychiatric Specialty Exam:  Presentation  General Appearance:  Fairly Groomed  Eye Contact: Good  Speech: Clear and Coherent  Speech Volume: Normal  Handedness: Right   Mood and Affect  Mood: Depressed; Anxious  Affect: Congruent   Thought Process  Thought Processes: Coherent  Descriptions of Associations:Intact  Orientation:Full (Time, Place and Person)  Thought Content:Logical  History of Schizophrenia/Schizoaffective disorder:No  Duration of Psychotic Symptoms:No data  recorded Hallucinations:Hallucinations: None  Ideas of Reference:None  Suicidal Thoughts:Suicidal Thoughts: No  Homicidal Thoughts:Homicidal Thoughts: No   Sensorium  Memory: Immediate Fair; Immediate Good  Judgment: Fair  Insight: Fair   Art therapist  Concentration: Good  Attention Span: Good  Recall: Good  Fund of Knowledge: Fair  Language: Fair   Psychomotor Activity  Psychomotor Activity: Psychomotor Activity: Normal   Assets  Assets: Resilience   Sleep  Sleep: Sleep: Good    Physical Exam: Physical Exam Constitutional:      Appearance: Normal appearance.  Musculoskeletal:     Cervical back: Normal range of motion.  Neurological:     Mental Status: He is alert and oriented to person, place, and time.    Review of Systems  Constitutional: Negative.   HENT: Negative.    Eyes: Negative.   Respiratory: Negative.    Cardiovascular: Negative.   Gastrointestinal: Negative.  Negative for heartburn.  Genitourinary: Negative.   Musculoskeletal: Negative.   Skin: Negative.   Neurological:  Negative for dizziness.  Psychiatric/Behavioral:  Positive for depression and substance abuse. Negative for hallucinations, memory loss and suicidal ideas. The patient is nervous/anxious and has insomnia.    Blood pressure 115/84, pulse (!) 58, temperature 98 F (36.7 C), temperature source Oral, resp. rate 16, height 5\' 8"  (1.727 m), weight 74.5 kg, SpO2 100%. Body mass index is 24.97 kg/m.  Treatment Plan Summary: Daily contact with patient to assess and evaluate symptoms and progress in treatment and Medication management   Safety and Monitoring: Voluntary admission to inpatient psychiatric unit for safety, stabilization and treatment Daily contact with patient to assess and evaluate symptoms and progress in treatment Patient's case to be discussed in multi-disciplinary team meeting Observation Level : q15 minute checks Vital signs: q12  hours Precautions: Safety   Long Term Goal(s): Improvement in symptoms so as ready for discharge   Short Term Goals: Ability to identify changes in lifestyle to  reduce recurrence of condition will improve, Ability to verbalize feelings will improve, Ability to disclose and discuss suicidal ideas, Ability to demonstrate self-control will improve, Ability to identify and develop effective coping behaviors will improve, Ability to maintain clinical measurements within normal limits will improve, Compliance with prescribed medications will improve, and Ability to identify triggers associated with substance abuse/mental health issues will improve   Diagnoses Principal Problem:   MDD (major depressive disorder), recurrent severe, without psychosis (HCC) Active Problems:   Alcohol use disorder   Delta-9-tetrahydrocannabinol (THC) dependence (HCC)   Cocaine use disorder (HCC)   Mild alcohol use disorder   Insomnia   Medications -Increase Zoloft from 50 to 100 mg daily for depressive symptoms & GAD starting 8/20 -Continue trazodone 50 mg and switch to nightly for sleep -Continue CIWA scores every 6 hours with 1 mg coverages for CIWA scores >10 for mild alcohol use disorder -Continue nicotine patch 21 mg every 24 hours for nicotine dependence -Continue bacitracin ointment twice daily to left upper thigh area for infection prophylaxis.  Patient reports that he has had tetanus shot within the last 5 years and that information was verified in the ED.    PRNS -Continue hydroxyzine 25 mg 3 times daily as needed for anxiety -Continue Nicorette gum every 2 hours for nicotine dependence -Continue agitation protocol medications: Haldol/Benadryl/Ativan as needed 3 times daily as needed for agitation-please see MAR for details -Continue Tylenol 650 mg every 6 hours PRN for mild pain -Continue Maalox 30 mg every 4 hrs PRN for indigestion -Continue Milk of Magnesia as needed every 6 hrs for constipation    Patient educated on all medications including benefits, rationales, and possible side effects.  Verbalizes understanding.   Labs reviewed: Ordered TSH, lipid panel, hemoglobin A1c, vitamin D, since labs have been over 3 months-Pending    Discharge Planning: Social work and case management to assist with discharge planning and identification of hospital follow-up needs prior to discharge Estimated LOS: 5-7 days Discharge Concerns: Need to establish a safety plan; Medication compliance and effectiveness Discharge Goals: Return home with outpatient referrals for mental health follow-up including medication management/psychotherapy   I certify that inpatient services furnished can reasonably be expected to improve the patient's condition.   Starleen Blue, NP 11/19/2022, 3:32 PM

## 2022-11-19 NOTE — BHH Group Notes (Signed)
Adult Psychoeducational Group Note  Date:  11/19/2022 Time:  9:31 PM  Group Topic/Focus:  Wrap-Up Group:   The focus of this group is to help patients review their daily goal of treatment and discuss progress on daily workbooks.  Participation Level:  Active  Participation Quality:  Appropriate  Affect:  Appropriate  Cognitive:  Appropriate  Insight: Appropriate  Engagement in Group:  Engaged  Modes of Intervention:  Discussion  Additional Comments:  Pt told that today was a good day on the unit, the highlights of which were the food and going to the courtyard. On the subject of goals for the week, Pt mentioned wanting to discharge soon and also get back in touch with his son, whom he hasn't spoken with in several months. Pt rated his day a 7 out of 10.  Christ Kick 11/19/2022, 9:31 PM

## 2022-11-19 NOTE — Progress Notes (Signed)
   11/19/22 2200  Psych Admission Type (Psych Patients Only)  Admission Status Voluntary  Psychosocial Assessment  Patient Complaints None  Eye Contact Fair  Facial Expression Animated  Affect Anxious  Speech Logical/coherent  Interaction Assertive  Motor Activity Slow  Appearance/Hygiene Unremarkable  Behavior Characteristics Cooperative  Mood Anxious;Pleasant  Aggressive Behavior  Effect No apparent injury  Thought Process  Coherency WDL  Content WDL  Delusions WDL  Perception WDL  Hallucination None reported or observed  Judgment Impaired  Confusion WDL  Danger to Self  Current suicidal ideation? Denies  Danger to Others  Danger to Others None reported or observed

## 2022-11-19 NOTE — Group Note (Signed)
Recreation Therapy Group Note   Group Topic:Stress Management  Group Date: 11/19/2022 Start Time: 0930 End Time: 1000 Facilitators: Wai Litt-McCall, LRT,CTRS Location: [address] Dayroom   Goal Area(s) Addresses:  Patient will actively participate in stress management techniques presented during session.  Patient will successfully identify benefit of practicing stress management post d/c.   Group Description:  Guided Imagery. LRT provided education, instruction, and demonstration on practice of visualization via guided imagery. Patient was asked to participate in the technique introduced during session. LRT debriefed including topics of mindfulness, stress management and specific scenarios each patient could use these techniques. Patients were given suggestions of ways to access scripts post d/c and encouraged to explore Youtube and other apps available on smartphones, tablets, and computers.   Clinical Observations/Individualized Feedback: Unable to conduct group session due to maintenance being completed in dayroom.     Plan: Continue to engage patient in RT group sessions 2-3x/week.   Aaron Boeh-McCall, LRT,CTRS 11/19/2022 12:03 PM

## 2022-11-19 NOTE — BH IP Treatment Plan (Signed)
Interdisciplinary Treatment and Diagnostic Plan Update  11/19/2022 Time of Session: 1030 Jonathan [name] MRN: [id number]  Principal Diagnosis: MDD (major depressive disorder), recurrent severe, without psychosis (HCC)  Secondary Diagnoses: Principal Problem:   MDD (major depressive disorder), recurrent severe, without psychosis (HCC) Active Problems:   Alcohol use disorder   Delta-9-tetrahydrocannabinol (THC) dependence (HCC)   Cocaine use disorder (HCC)   Mild alcohol use disorder   Insomnia   Current Medications:  Current Facility-Administered Medications  Medication Dose Route Frequency Provider Last Rate Last Admin   acetaminophen (TYLENOL) tablet 650 mg  650 mg Oral Q6H PRN Massengill, Harrold Donath, MD   650 mg at 11/17/22 1324   alum & mag hydroxide-simeth (MAALOX/MYLANTA) 200-200-20 MG/5ML suspension 30 mL  30 mL Oral Q4H PRN Massengill, Harrold Donath, MD       diphenhydrAMINE (BENADRYL) capsule 50 mg  50 mg Oral TID PRN Massengill, Harrold Donath, MD       Or   diphenhydrAMINE (BENADRYL) injection 50 mg  50 mg Intramuscular TID PRN Massengill, Harrold Donath, MD       haloperidol (HALDOL) tablet 5 mg  5 mg Oral TID PRN Phineas Inches, MD       Or   haloperidol lactate (HALDOL) injection 5 mg  5 mg Intramuscular TID PRN Massengill, Harrold Donath, MD       hydrOXYzine (ATARAX) tablet 25 mg  25 mg Oral TID PRN Starleen Blue, NP   25 mg at 11/18/22 2124   loperamide (IMODIUM) capsule 2-4 mg  2-4 mg Oral PRN Starleen Blue, NP       LORazepam (ATIVAN) tablet 2 mg  2 mg Oral TID PRN Phineas Inches, MD       Or   LORazepam (ATIVAN) injection 2 mg  2 mg Intramuscular TID PRN Massengill, Harrold Donath, MD       LORazepam (ATIVAN) tablet 1 mg  1 mg Oral Q6H PRN Starleen Blue, NP       magnesium hydroxide (MILK OF MAGNESIA) suspension 30 mL  30 mL Oral Daily PRN Massengill, Harrold Donath, MD       multivitamin with minerals tablet 1 tablet  1 tablet Oral Daily Massengill, Nathan, MD   1 tablet at 11/19/22 0800    neomycin-bacitracin-polymyxin (NEOSPORIN) ointment   Topical BID Starleen Blue, NP   Given at 11/18/22 2124   nicotine (NICODERM CQ - dosed in mg/24 hours) patch 21 mg  21 mg Transdermal Daily Starleen Blue, NP   21 mg at 11/19/22 0801   nicotine polacrilex (NICORETTE) gum 2 mg  2 mg Oral PRN Massengill, Harrold Donath, MD       ondansetron (ZOFRAN-ODT) disintegrating tablet 4 mg  4 mg Oral Q6H PRN Starleen Blue, NP       sertraline (ZOLOFT) tablet 50 mg  50 mg Oral Daily Massengill, Nathan, MD   50 mg at 11/19/22 0800   thiamine (Vitamin B-1) tablet 100 mg  100 mg Oral Daily Nkwenti, Doris, NP   100 mg at 11/19/22 0800   traZODone (DESYREL) tablet 50 mg  50 mg Oral QHS Starleen Blue, NP   50 mg at 11/18/22 2124   PTA Medications: No medications prior to admission.    Patient Stressors: Financial difficulties   Occupational concerns   Substance abuse    Patient Strengths: Automotive engineer for treatment/growth   Treatment Modalities: Medication Management, Group therapy, Case management,  1 to 1 session with clinician, Psychoeducation, Recreational therapy.   Physician Treatment Plan for Primary Diagnosis: MDD (major depressive disorder),  recurrent severe, without psychosis (HCC) Long Term Goal(s): Improvement in symptoms so as ready for discharge   Short Term Goals: Ability to identify changes in lifestyle to reduce recurrence of condition will improve Ability to verbalize feelings will improve Ability to disclose and discuss suicidal ideas Ability to demonstrate self-control will improve Ability to identify and develop effective coping behaviors will improve Ability to maintain clinical measurements within normal limits will improve Compliance with prescribed medications will improve Ability to identify triggers associated with substance abuse/mental health issues will improve  Medication Management: Evaluate patient's response, side effects, and tolerance of  medication regimen.  Therapeutic Interventions: 1 to 1 sessions, Unit Group sessions and Medication administration.  Evaluation of Outcomes: Progressing  Physician Treatment Plan for Secondary Diagnosis: Principal Problem:   MDD (major depressive disorder), recurrent severe, without psychosis (HCC) Active Problems:   Alcohol use disorder   Delta-9-tetrahydrocannabinol (THC) dependence (HCC)   Cocaine use disorder (HCC)   Mild alcohol use disorder   Insomnia  Long Term Goal(s): Improvement in symptoms so as ready for discharge   Short Term Goals: Ability to identify changes in lifestyle to reduce recurrence of condition will improve Ability to verbalize feelings will improve Ability to disclose and discuss suicidal ideas Ability to demonstrate self-control will improve Ability to identify and develop effective coping behaviors will improve Ability to maintain clinical measurements within normal limits will improve Compliance with prescribed medications will improve Ability to identify triggers associated with substance abuse/mental health issues will improve     Medication Management: Evaluate patient's response, side effects, and tolerance of medication regimen.  Therapeutic Interventions: 1 to 1 sessions, Unit Group sessions and Medication administration.  Evaluation of Outcomes: Progressing   RN Treatment Plan for Primary Diagnosis: MDD (major depressive disorder), recurrent severe, without psychosis (HCC) Long Term Goal(s): Knowledge of disease and therapeutic regimen to maintain health will improve  Short Term Goals: Ability to remain free from injury will improve, Ability to verbalize frustration and anger appropriately will improve, Ability to demonstrate self-control, Ability to participate in decision making will improve, Ability to verbalize feelings will improve, Ability to disclose and discuss suicidal ideas, Ability to identify and develop effective coping behaviors  will improve, and Compliance with prescribed medications will improve  Medication Management: RN will administer medications as ordered by provider, will assess and evaluate patient's response and provide education to patient for prescribed medication. RN will report any adverse and/or side effects to prescribing provider.  Therapeutic Interventions: 1 on 1 counseling sessions, Psychoeducation, Medication administration, Evaluate responses to treatment, Monitor vital signs and CBGs as ordered, Perform/monitor CIWA, COWS, AIMS and Fall Risk screenings as ordered, Perform wound care treatments as ordered.  Evaluation of Outcomes: Progressing   LCSW Treatment Plan for Primary Diagnosis: MDD (major depressive disorder), recurrent severe, without psychosis (HCC) Long Term Goal(s): Safe transition to appropriate next level of care at discharge, Engage patient in therapeutic group addressing interpersonal concerns.  Short Term Goals: Engage patient in aftercare planning with referrals and resources, Increase social support, Increase ability to appropriately verbalize feelings, Increase emotional regulation, Facilitate acceptance of mental health diagnosis and concerns, Facilitate patient progression through stages of change regarding substance use diagnoses and concerns, Identify triggers associated with mental health/substance abuse issues, and Increase skills for wellness and recovery  Therapeutic Interventions: Assess for all discharge needs, 1 to 1 time with Social worker, Explore available resources and support systems, Assess for adequacy in community support network, Educate family and significant other(s) on suicide  prevention, Complete Psychosocial Assessment, Interpersonal group therapy.  Evaluation of Outcomes: Progressing   Progress in Treatment: Attending groups: Yes. Participating in groups: No. Taking medication as prescribed: No. Toleration medication: Yes. Family/Significant other  contact made: No, will contact:  when consents are made Patient understands diagnosis: Yes. Discussing patient identified problems/goals with staff: Yes. Medical problems stabilized or resolved: Yes. Denies suicidal/homicidal ideation: Yes. Issues/concerns per patient self-inventory: No. Other:   New problem(s) identified: No, Describe:  none reported  New Short Term/Long Term Goal(s): medication management for mood, development of comprehensive mental wellnessstabilization;  Patient Goals:  work on depression and anxiety, medication management  Discharge Plan or Barriers: Patient recently admitted. CSW will continue to follow and assess for appropriate referrals and possible discharge planning.  Reason for Continuation of Hospitalization: Depression Medication stabilization  Estimated Length of Stay:3-5 days  Last 3 [place] Suicide Severity Risk Score: Flowsheet Row [facility] (Current) [facility] Most recent reading at 11/17/2022  1:00 PM ED from 11/17/2022 in [facility] Department at [facility] Most recent reading at 11/17/2022  2:20 AM ED from 10/07/2022 in [facility] Department at [facility] Most recent reading at 10/07/2022  3:48 PM  C-SSRS RISK CATEGORY High Risk High Risk No Risk       Last PHQ 2/9 Scores:    07/31/2022   12:39 PM 07/30/2022    2:36 AM  Depression screen PHQ 2/9  Decreased Interest 1 3  Down, Depressed, Hopeless 1 3  PHQ - 2 Score 2 6  Altered sleeping 0 3  Tired, decreased energy 1 3  Change in appetite 0 2  Feeling bad or failure about yourself  0 3  Trouble concentrating 0   Moving slowly or fidgety/restless 0 2  Suicidal thoughts 0 1  PHQ-9 Score 3 20  Difficult doing work/chores Somewhat difficult Extremely dIfficult    Scribe for Treatment Team: Charise Killian 11/19/2022 2:21 PM

## 2022-11-19 NOTE — Progress Notes (Signed)
D: Patient is alert, oriented, pleasant, and cooperative. Denies SI, HI, AVH, and verbally contracts for safety. Patient denies physical symptoms/pain.   A: Scheduled medications administered per MD order. Support provided. Patient educated on safety on the unit and medications. Routine safety checks every 15 minutes. Patient stated understanding to tell nurse about any new physical symptoms. Patient understands to tell staff of any needs.     R: No adverse drug reactions noted. Patient verbally contracts for safety. Patient remains safe at this time and will continue to monitor.    11/19/22 1000  Psych Admission Type (Psych Patients Only)  Admission Status Voluntary  Psychosocial Assessment  Patient Complaints None  Eye Contact Fair  Facial Expression Animated  Affect Anxious  Speech Logical/coherent  Interaction Assertive  Motor Activity Other (Comment) (WNL)  Appearance/Hygiene Unremarkable  Behavior Characteristics Cooperative;Appropriate to situation  Mood Anxious;Pleasant  Thought Process  Coherency WDL  Content WDL  Delusions None reported or observed  Perception WDL  Hallucination None reported or observed  Judgment Impaired  Confusion None  Danger to Self  Current suicidal ideation? Denies  Agreement Not to Harm Self Yes  Description of Agreement verbal  Danger to Others  Danger to Others None reported or observed

## 2022-11-19 NOTE — Plan of Care (Signed)
  Problem: Education: Goal: Knowledge of [place] [facility] Education information/materials will improve Outcome: Progressing Goal: Emotional status will improve Outcome: Progressing Goal: Mental status will improve Outcome: Progressing Goal: Verbalization of understanding the information provided will improve Outcome: Progressing   Problem: Activity: Goal: Interest or engagement in activities will improve Outcome: Progressing Goal: Sleeping patterns will improve Outcome: Progressing   Problem: Coping: Goal: Ability to verbalize frustrations and anger appropriately will improve Outcome: Progressing

## 2022-11-20 DIAGNOSIS — F332 Major depressive disorder, recurrent severe without psychotic features: Secondary | ICD-10-CM | POA: Diagnosis not present

## 2022-11-20 LAB — LIPID PANEL
Cholesterol: 171 mg/dL (ref 0–200)
HDL: 37 mg/dL — ABNORMAL LOW (ref >40)
LDL Cholesterol: 96 mg/dL (ref 0–99)
Total CHOL/HDL Ratio: 4.6 ratio
Triglycerides: 192 mg/dL — ABNORMAL HIGH (ref <150)
VLDL: 38 mg/dL (ref 0–40)

## 2022-11-20 LAB — TSH: TSH: 1.248 u[IU]/mL (ref 0.350–4.500)

## 2022-11-20 LAB — HEMOGLOBIN A1C
Hgb A1c MFr Bld: 5.5 % (ref 4.8–5.6)
Mean Plasma Glucose: 111.15 mg/dL

## 2022-11-20 LAB — VITAMIN D 25 HYDROXY (VIT D DEFICIENCY, FRACTURES): Vit D, 25-Hydroxy: 26.84 ng/mL — ABNORMAL LOW (ref 30–100)

## 2022-11-20 NOTE — Group Note (Signed)
[facility] LCSW Group Therapy Note   Group Date: 11/20/2022 Start Time: 1100 End Time: 1140   Type of Therapy/Topic:  Group Therapy:  Emotion Regulation  Participation Level:  Minimal   Mood:  Description of Group:    The purpose of this group is to assist patients in learning to regulate negative emotions and experience positive emotions. Patients will be guided to discuss ways in which they have been vulnerable to their negative emotions. These vulnerabilities will be juxtaposed with experiences of positive emotions or situations, and patients challenged to use positive emotions to combat negative ones. Special emphasis will be placed on coping with negative emotions in conflict situations, and patients will process healthy conflict resolution skills.  Therapeutic Goals: Patient will identify two positive emotions or experiences to reflect on in order to balance out negative emotions:  Patient will label two or more emotions that they find the most difficult to experience:  Patient will be able to demonstrate positive conflict resolution skills through discussion or role plays:   Summary of Patient Progress:   Patient was engaged and present for entire group session.     Therapeutic Modalities:   Cognitive Behavioral Therapy Feelings Identification Dialectical Behavioral Therapy   Starleen Arms, LCSW

## 2022-11-20 NOTE — [facility] Group Notes (Signed)

## 2022-11-20 NOTE — Group Note (Signed)
Recreation Therapy Group Note   Group Topic:Animal Assisted Therapy   Group Date: 11/20/2022 Start Time: 0945 End Time: 1030 Facilitators: Ilaisaane Marts-McCall, LRT,CTRS Location: [address] Dayroom   Animal-Assisted Activity (AAA) Program Checklist/Progress Notes Patient Eligibility Criteria Checklist & Daily Group note for Rec Tx Intervention  AAA/T Program Assumption of Risk Form signed by Patient/ or Parent Legal Guardian Yes  Patient is free of allergies or severe asthma Yes  Patient reports no fear of animals Yes  Patient reports no history of cruelty to animals Yes  Patient understands his/her participation is voluntary Yes  Patient washes hands before animal contact Yes  Patient washes hands after animal contact Yes   Affect/Mood: Appropriate   Participation Level: Engaged   Participation Quality: Independent   Behavior: Appropriate    Clinical Observations/Individualized Feedback: Patient attended session and interacted appropriately with therapy dog and peers. Patient asked appropriate questions about therapy dog and his training. Patient shared stories about their pets at home with group.     Plan: Continue to engage patient in RT group sessions 2-3x/week.   Marybeth Dandy-McCall, LRT,CTRS 11/20/2022 1:27 PM

## 2022-11-20 NOTE — Progress Notes (Signed)
   11/20/22 0830  Psych Admission Type (Psych Patients Only)  Admission Status Voluntary  Psychosocial Assessment  Patient Complaints None  Eye Contact Brief  Facial Expression Flat  Affect Flat  Speech Logical/coherent  Interaction Assertive  Motor Activity Other (Comment) (unremarkable)  Appearance/Hygiene Unremarkable  Behavior Characteristics Cooperative  Mood Pleasant  Thought Process  Coherency WDL  Content WDL  Delusions WDL  Perception WDL  Hallucination None reported or observed  Judgment Poor  Confusion WDL  Danger to Self  Current suicidal ideation? Denies  Agreement Not to Harm Self Yes  Description of Agreement verbal  Danger to Others  Danger to Others None reported or observed

## 2022-11-20 NOTE — [facility] Group Notes (Signed)
[facility] Group Notes:  (Nursing/MHT/Case Management/Adjunct)  Date:  11/20/2022  Time:  10:20 PM  Type of Therapy:  Psychoeducational Skills  Participation Level:  Active  Participation Quality:  Appropriate  Affect:  Appropriate  Cognitive:  Appropriate  Insight:  Appropriate  Engagement in Group:  Developing/Improving  Modes of Intervention:  Support  Summary of Progress/Problems: The patient rated his day as an 8 out of 10. He states that he went outside for fresh air and laughed a great deal. His goal for tomorrow is to get a discharge date and confirm that he has a ride home.   Ladan Vanderzanden S 11/20/2022, 10:20 PM

## 2022-11-20 NOTE — [facility] Counselor (Addendum)
[facility] LCSW Progress Note   11/20/2022    1:41 PM  Jonathan [name]      Type of Note: [facility]   CSW called this provider and had to leave a voicemail for intake coordinator regarding patient referral.    CSW spoke with Morrie Sheldon at [facility] and she informed CSW that she had received referral yesterday morning and will need patient to complete hs prescreening. CSW provided patient with number and told him to call now, patient said "ok" and went back into group.    Signed:   Jacob Moores, MSW, LCSWA 11/20/2022 1:41 PM

## 2022-11-20 NOTE — Progress Notes (Signed)
[facility] MD Progress Note  11/20/2022 4:06 PM Jonathan [name]  MRN:  [id number]  Principal Problem: MDD (major depressive disorder), recurrent severe, without psychosis (HCC) Diagnosis: Principal Problem:   MDD (major depressive disorder), recurrent severe, without psychosis (HCC) Active Problems:   Alcohol use disorder   Delta-9-tetrahydrocannabinol (THC) dependence (HCC)   Cocaine use disorder (HCC)   Mild alcohol use disorder   Insomnia  Reason For Admission: Jonathan [name] is a 27 yo AAM with prior mental health diagnoses of MDD, GAD & cocaine use d/o who presented to the [facility] ER on 8/17 with complaints of worsening depressive symptoms & SI. Pt also made multiple cuts to his left upper thigh area with a piece of broken glass as per his reports prior to presentation to the ER in an effort to release stress. Pt was transferred voluntarily to this [facility] for treatment and stabilization of his mental status.   24 hr chart review: Sleep Hours last night: none documented, but pt states that his sleep quality last night was good Nursing Concerns: none reported/documented Behavioral episodes in the past 24 hrs: none  Medication Compliance: compliant  Vital [name] in the past 24 hrs: WNL with exception of HR which was slightly low at 52. Pt asymptomatic. Fluids encouraged. PRN Medications in the past 24 hrs: Hydroxyzine   Patient assessment note: On assessment today, the pt reports that their mood is euthymic, improved since admission, and stable. Denies feeling down, depressed, or sad.  Reports that anxiety symptoms are at manageable level.  Sleep is stable. Appetite is stable.  Concentration is without complaint.  Energy level is adequate. Denies having any suicidal thoughts. Denies having any suicidal intent and plan.  Denies having any HI.  Denies having psychotic symptoms.   Denies having side effects to current psychiatric medications.   Discussed discharge planning: Patient  reports that he will be going to his girlfriend's house after discharge.  Educated on the need to provide CSW with his girlfriend's contact information for discharge planning.  Educated that we will discharge him tomorrow 8/21 as per his request if discharge safety planning can be completed with girlfriend, as he is no longer interested in going to rehab at this time.  Past Psychiatric History: See H & P  Past Medical History:  Past Medical History:  Diagnosis Date   Bipolar 1 disorder (HCC)    History reviewed. No pertinent surgical history. Family History: History reviewed. No pertinent family history. Family Psychiatric  History: See H & P Social History:  Social History   Substance and Sexual Activity  Alcohol Use Yes   Alcohol/week: 2.0 standard drinks of alcohol   Types: 2 Cans of beer per week   Comment: Occ     Social History   Substance and Sexual Activity  Drug Use Not Currently   Types: Marijuana    Social History   Socioeconomic History   Marital status: Single    Spouse name: Not on file   Number of children: Not on file   Years of education: Not on file   Highest education level: Not on file  Occupational History   Not on file  Tobacco Use   Smoking status: Every Day    Current packs/day: 1.00    Types: Cigarettes   Smokeless tobacco: Never  Substance and Sexual Activity   Alcohol use: Yes    Alcohol/week: 2.0 standard drinks of alcohol    Types: 2 Cans of beer per week  Comment: Occ   Drug use: Not Currently    Types: Marijuana   Sexual activity: Yes    Birth control/protection: Condom  Other Topics Concern   Not on file  Social History Narrative   ** Merged History Encounter **       Social Determinants of Health   Financial Resource Strain: Not on file  Food Insecurity: No Food Insecurity (11/17/2022)   Hunger Vital Sign    Worried About Running Out of Food in the Last Year: Never true    Ran Out of Food in the Last Year: Never true   Transportation Needs: No Transportation Needs (11/17/2022)   PRAPARE - Administrator, Civil Service (Medical): No    Lack of Transportation (Non-Medical): No  Physical Activity: Not on file  Stress: Not on file  Social Connections: Not on file   Sleep: Good  Appetite:  Good  Current Medications: Current Facility-Administered Medications  Medication Dose Route Frequency Provider Last Rate Last Admin   acetaminophen (TYLENOL) tablet 650 mg  650 mg Oral Q6H PRN Massengill, Harrold Donath, MD   650 mg at 11/17/22 1324   alum & mag hydroxide-simeth (MAALOX/MYLANTA) 200-200-20 MG/5ML suspension 30 mL  30 mL Oral Q4H PRN Massengill, Harrold Donath, MD       diphenhydrAMINE (BENADRYL) capsule 50 mg  50 mg Oral TID PRN Massengill, Harrold Donath, MD       Or   diphenhydrAMINE (BENADRYL) injection 50 mg  50 mg Intramuscular TID PRN Massengill, Harrold Donath, MD       haloperidol (HALDOL) tablet 5 mg  5 mg Oral TID PRN Phineas Inches, MD       Or   haloperidol lactate (HALDOL) injection 5 mg  5 mg Intramuscular TID PRN Massengill, Harrold Donath, MD       hydrOXYzine (ATARAX) tablet 25 mg  25 mg Oral TID PRN Starleen Blue, NP   25 mg at 11/19/22 2105   loperamide (IMODIUM) capsule 2-4 mg  2-4 mg Oral PRN Starleen Blue, NP       LORazepam (ATIVAN) tablet 2 mg  2 mg Oral TID PRN Phineas Inches, MD       Or   LORazepam (ATIVAN) injection 2 mg  2 mg Intramuscular TID PRN Massengill, Harrold Donath, MD       LORazepam (ATIVAN) tablet 1 mg  1 mg Oral Q6H PRN Starleen Blue, NP       magnesium hydroxide (MILK OF MAGNESIA) suspension 30 mL  30 mL Oral Daily PRN Massengill, Harrold Donath, MD       multivitamin with minerals tablet 1 tablet  1 tablet Oral Daily Massengill, Nathan, MD   1 tablet at 11/20/22 0831   neomycin-bacitracin-polymyxin (NEOSPORIN) ointment   Topical BID Starleen Blue, NP   Given at 11/18/22 2124   nicotine (NICODERM CQ - dosed in mg/24 hours) patch 21 mg  21 mg Transdermal Daily Starleen Blue, NP   21 mg at  11/19/22 0801   nicotine polacrilex (NICORETTE) gum 2 mg  2 mg Oral PRN Massengill, Harrold Donath, MD       ondansetron (ZOFRAN-ODT) disintegrating tablet 4 mg  4 mg Oral Q6H PRN Starleen Blue, NP       sertraline (ZOLOFT) tablet 100 mg  100 mg Oral Daily Brittani Purdum, NP   100 mg at 11/20/22 0831   thiamine (Vitamin B-1) tablet 100 mg  100 mg Oral Daily Starleen Blue, NP   100 mg at 11/20/22 0831   traZODone (DESYREL) tablet 50 mg  50  mg Oral QHS Starleen Blue, NP   50 mg at 11/19/22 2105    Lab Results:  Results for orders placed or performed during the hospital encounter of 11/17/22 (from the past 48 hour(s))  Lipid panel     Status: Abnormal   Collection Time: 11/20/22  6:48 AM  Result Value Ref Range   Cholesterol 171 0 - 200 mg/dL   Triglycerides 846 (H) <150 mg/dL   HDL 37 (L) >96 mg/dL   Total CHOL/HDL Ratio 4.6 RATIO   VLDL 38 0 - 40 mg/dL   LDL Cholesterol 96 0 - 99 mg/dL    Comment:        Total Cholesterol/HDL:CHD Risk Coronary Heart Disease Risk Table                     Men   Women  1/2 Average Risk   3.4   3.3  Average Risk       5.0   4.4  2 X Average Risk   9.6   7.1  3 X Average Risk  23.4   11.0        Use the calculated Patient Ratio above and the CHD Risk Table to determine the patient's CHD Risk.        ATP III CLASSIFICATION (LDL):  <100     mg/dL   Optimal  295-284  mg/dL   Near or Above                    Optimal  130-159  mg/dL   Borderline  132-440  mg/dL   High  >102     mg/dL   Very High Performed at [facility], [address], [place], [place] [address]   Hemoglobin A1c     Status: None   Collection Time: 11/20/22  6:49 AM  Result Value Ref Range   Hgb A1c MFr Bld 5.5 4.8 - 5.6 %    Comment: (NOTE) Pre diabetes:          5.7%-6.4%  Diabetes:              >6.4%  Glycemic control for   <7.0% adults with diabetes    Mean Plasma Glucose 111.15 mg/dL    Comment: Performed at [facility], [address]  [place], [place] [address]  TSH     Status: None   Collection Time: 11/20/22  6:49 AM  Result Value Ref Range   TSH 1.248 0.350 - 4.500 uIU/mL    Comment: Performed by a 3rd Generation assay with a functional sensitivity of <=0.01 uIU/mL. Performed at [facility], [address], [place], [place] [address]   VITAMIN D 25 Hydroxy (Vit-D Deficiency, Fractures)     Status: Abnormal   Collection Time: 11/20/22  6:49 AM  Result Value Ref Range   Vit D, 25-Hydroxy 26.84 (L) 30 - 100 ng/mL    Comment: (NOTE) Vitamin D deficiency has been defined by the Institute of Medicine  and an Endocrine Society practice guideline as a level of serum 25-OH  vitamin D less than 20 ng/mL (1,2). The Endocrine Society went on to  further define vitamin D insufficiency as a level between 21 and 29  ng/mL (2).  1. IOM (Institute of Medicine). 2010. Dietary reference intakes for  calcium and D. [place]: [employer]. 2. Holick MF, Binkley [place], Bischoff-Ferrari HA, et al. Evaluation,  treatment, and prevention of vitamin  D deficiency: an Endocrine  Society clinical practice guideline, JCEM. 2011 Jul; 96(7): 1911-30.  Performed at [facility], [address], [place], [place] [address]     Blood Alcohol level:  Lab Results  Component Value Date   ETH 39 (H) 11/17/2022   ETH <10 07/30/2022    Metabolic Disorder Labs: Lab Results  Component Value Date   HGBA1C 5.5 11/20/2022   MPG 111.15 11/20/2022   MPG 116.89 07/30/2022   No results found for: "PROLACTIN" Lab Results  Component Value Date   CHOL 171 11/20/2022   TRIG 192 (H) 11/20/2022   HDL 37 (L) 11/20/2022   CHOLHDL 4.6 11/20/2022   VLDL 38 11/20/2022   LDLCALC 96 11/20/2022   LDLCALC 110 (H) 07/30/2022    Physical Findings: AIMS:  , ,  ,  ,    CIWA:  CIWA-Ar Total: 0 COWS:     Musculoskeletal: Strength & Muscle Tone: within normal limits Gait & Station: normal Patient leans:  N/A  Psychiatric Specialty Exam:  Presentation  General Appearance:  Appropriate for Environment; Casual; Fairly Groomed  Eye Contact: Fair  Speech: Clear and Coherent  Speech Volume: Normal  Handedness: Right   Mood and Affect  Mood: Euthymic  Affect: Appropriate; Congruent   Thought Process  Thought Processes: Coherent  Descriptions of Associations:Intact  Orientation:Full (Time, Place and Person)  Thought Content:Logical  History of Schizophrenia/Schizoaffective disorder:No  Duration of Psychotic Symptoms:No data recorded Hallucinations:Hallucinations: None  Ideas of Reference:None  Suicidal Thoughts:Suicidal Thoughts: No   Homicidal Thoughts:Homicidal Thoughts: No    Sensorium  Memory: Immediate Good  Judgment: Good  Insight: Good   Executive Functions  Concentration: Good  Attention Span: Good  Recall: Good  Fund of Knowledge: Good  Language: Good   Psychomotor Activity  Psychomotor Activity: Psychomotor Activity: Normal   Assets  Assets: Communication Skills; Resilience   Sleep  Sleep: Sleep: Good    Physical Exam: Physical Exam Constitutional:      Appearance: Normal appearance.  Musculoskeletal:     Cervical back: Normal range of motion.  Neurological:     Mental Status: He is alert and oriented to person, place, and time.    Review of Systems  Constitutional: Negative.   HENT: Negative.    Eyes: Negative.   Respiratory: Negative.    Cardiovascular: Negative.   Gastrointestinal: Negative.  Negative for heartburn.  Genitourinary: Negative.   Musculoskeletal: Negative.   Skin: Negative.   Neurological:  Negative for dizziness.  Psychiatric/Behavioral:  Positive for depression and substance abuse. Negative for hallucinations, memory loss and suicidal ideas. The patient is nervous/anxious and has insomnia.    Blood pressure 114/67, pulse (!) 52, temperature 98.1 F (36.7 C), resp. rate 13,  height 5\' 8"  (1.727 m), weight 74.5 kg, SpO2 100%. Body mass index is 24.97 kg/m.  Treatment Plan Summary: Daily contact with patient to assess and evaluate symptoms and progress in treatment and Medication management   Safety and Monitoring: Voluntary admission to inpatient psychiatric unit for safety, stabilization and treatment Daily contact with patient to assess and evaluate symptoms and progress in treatment Patient's case to be discussed in multi-disciplinary team meeting Observation Level : q15 minute checks Vital [name]: q12 hours Precautions: Safety   Long Term Goal(s): Improvement in symptoms so as ready for discharge   Short Term Goals: Ability to identify changes in lifestyle to reduce recurrence of condition will improve, Ability to verbalize feelings will improve, Ability to disclose and discuss suicidal ideas, Ability  to demonstrate self-control will improve, Ability to identify and develop effective coping behaviors will improve, Ability to maintain clinical measurements within normal limits will improve, Compliance with prescribed medications will improve, and Ability to identify triggers associated with substance abuse/mental health issues will improve   Diagnoses Principal Problem:   MDD (major depressive disorder), recurrent severe, without psychosis (HCC) Active Problems:   Alcohol use disorder   Delta-9-tetrahydrocannabinol (THC) dependence (HCC)   Cocaine use disorder (HCC)   Mild alcohol use disorder   Insomnia   Medications -Continue Zoloft 100 mg daily for depressive symptoms & GAD  -Continue trazodone 50 mg and switch to nightly for sleep -Continue CIWA scores every 6 hours with 1 mg coverages for CIWA scores >10 for mild alcohol use disorder -Continue nicotine patch 21 mg every 24 hours for nicotine dependence -Continue bacitracin ointment twice daily to left upper thigh area for infection prophylaxis.  Patient reports that he has had tetanus shot within  the last 5 years and that information was verified in the ED.    PRNS -Continue hydroxyzine 25 mg 3 times daily as needed for anxiety -Continue Nicorette gum every 2 hours for nicotine dependence -Continue agitation protocol medications: Haldol/Benadryl/Ativan as needed 3 times daily as needed for agitation-please see MAR for details -Continue Tylenol 650 mg every 6 hours PRN for mild pain -Continue Maalox 30 mg every 4 hrs PRN for indigestion -Continue Milk of Magnesia as needed every 6 hrs for constipation   Patient educated on all medications including benefits, rationales, and possible side effects.  Verbalizes understanding.   Labs reviewed: Ordered TSH, lipid panel, hemoglobin A1c, vitamin D, since labs have been over 3 months.  All results reviewed.  Triglycerides are 192, educated healthy food choices and exercise.  Vitamin D slightly low At 26.84.  Ordered vitamin D 50,000 units weekly for supplementation.   Discharge Planning: Social work and case management to assist with discharge planning and identification of hospital follow-up needs prior to discharge Estimated LOS: 5-7 days Discharge Concerns: Need to establish a safety plan; Medication compliance and effectiveness Discharge Goals: Return home with outpatient referrals for mental health follow-up including medication management/psychotherapy   I certify that inpatient services furnished can reasonably be expected to improve the patient's condition.   Starleen Blue, NP 11/20/2022, 4:06 PM Patient ID: [name], male   DOB: 02-May-1995, 27 y.o.   MRN: [id number]

## 2022-11-20 NOTE — Progress Notes (Signed)
   11/20/22 2100  Psych Admission Type (Psych Patients Only)  Admission Status Voluntary  Psychosocial Assessment  Patient Complaints None  Eye Contact Fair  Facial Expression Animated  Affect Anxious  Speech Logical/coherent  Interaction Assertive  Motor Activity Slow  Appearance/Hygiene Unremarkable  Aggressive Behavior  Effect No apparent injury  Thought Process  Coherency WDL  Content WDL  Delusions WDL  Perception WDL  Hallucination None reported or observed  Judgment Impaired  Confusion WDL  Danger to Self  Current suicidal ideation? Denies  Danger to Others  Danger to Others None reported or observed

## 2022-11-20 NOTE — Plan of Care (Signed)
  Problem: Education: Goal: Emotional status will improve Outcome: Progressing Goal: Mental status will improve Outcome: Progressing   Problem: Activity: Goal: Sleeping patterns will improve Outcome: Progressing   Problem: Coping: Goal: Ability to demonstrate self-control will improve Outcome: Progressing   Problem: Physical Regulation: Goal: Ability to maintain clinical measurements within normal limits will improve Outcome: Progressing   Problem: Safety: Goal: Periods of time without injury will increase Outcome: Progressing

## 2022-11-21 DIAGNOSIS — F332 Major depressive disorder, recurrent severe without psychotic features: Secondary | ICD-10-CM

## 2022-11-21 MED ORDER — BACITRACIN-NEOMYCIN-POLYMYXIN OINTMENT TUBE: 1.0000 | TOPICAL_OINTMENT | Freq: Two times a day (BID) | CUTANEOUS | Status: DC

## 2022-11-21 MED ORDER — HYDROXYZINE HCL 25 MG PO TABS: 25.0000 mg | ORAL_TABLET | Freq: Three times a day (TID) | ORAL | 0 refills | Status: DC | PRN

## 2022-11-21 MED ORDER — SERTRALINE HCL 100 MG PO TABS: 100.0000 mg | ORAL_TABLET | Freq: Every day | ORAL | 0 refills | Status: DC

## 2022-11-21 MED ORDER — NICOTINE 21 MG/24HR TD PT24: 21.0000 mg | MEDICATED_PATCH | Freq: Every day | TRANSDERMAL | 0 refills | Status: DC

## 2022-11-21 MED ORDER — TRAZODONE HCL 50 MG PO TABS: 50.0000 mg | ORAL_TABLET | Freq: Every day | ORAL | 0 refills | Status: DC

## 2022-11-21 NOTE — Discharge Summary (Signed)
Physician Discharge Summary Note  Patient:  Jonathan [name] is an 27 y.o., male MRN:  [id number] DOB:  Sep 12, 1995 Patient phone:  [phone] (home)  Patient address:   [address] [place] [address],  Total Time spent with patient: 45 minutes  Date of Admission:  11/17/2022 Date of Discharge: 11/21/2022  Reason for Admission:  Reason For Admission: Jonathan [name] is a 27 yo AAM with prior mental health diagnoses of MDD, GAD & cocaine use d/o who presented to the [facility] ER on 8/17 with complaints of worsening depressive symptoms & SI. Pt also made multiple cuts to his left upper thigh area with a piece of broken glass as per his reports prior to presentation to the ER in an effort to release stress. Pt was transferred voluntarily to this [facility] for treatment and stabilization of his mental status.   Principal Problem: MDD (major depressive disorder), recurrent severe, without psychosis (HCC) Discharge Diagnoses: Principal Problem:   MDD (major depressive disorder), recurrent severe, without psychosis (HCC) Active Problems:   Alcohol use disorder   Delta-9-tetrahydrocannabinol (THC) dependence (HCC)   Cocaine use disorder (HCC)   Mild alcohol use disorder   Insomnia  Past Psychiatric History: See H & P  Past Medical History:  Past Medical History:  Diagnosis Date   Bipolar 1 disorder (HCC)    History reviewed. No pertinent surgical history. Family History: History reviewed. No pertinent family history. Family Psychiatric  History: See H & P Social History:  Social History   Substance and Sexual Activity  Alcohol Use Yes   Alcohol/week: 2.0 standard drinks of alcohol   Types: 2 Cans of beer per week   Comment: Occ     Social History   Substance and Sexual Activity  Drug Use Not Currently   Types: Marijuana    Social History   Socioeconomic History   Marital status: Single    Spouse name: Not on file   Number of children: Not on file   Years of  education: Not on file   Highest education level: Not on file  Occupational History   Not on file  Tobacco Use   Smoking status: Every Day    Current packs/day: 1.00    Types: Cigarettes   Smokeless tobacco: Never  Substance and Sexual Activity   Alcohol use: Yes    Alcohol/week: 2.0 standard drinks of alcohol    Types: 2 Cans of beer per week    Comment: Occ   Drug use: Not Currently    Types: Marijuana   Sexual activity: Yes    Birth control/protection: Condom  Other Topics Concern   Not on file  Social History Narrative   ** Merged History Encounter **       Social Determinants of Health   Financial Resource Strain: Not on file  Food Insecurity: No Food Insecurity (11/17/2022)   Hunger Vital Sign    Worried About Running Out of Food in the Last Year: Never true    Ran Out of Food in the Last Year: Never true  Transportation Needs: No Transportation Needs (11/17/2022)   PRAPARE - Administrator, Civil Service (Medical): No    Lack of Transportation (Non-Medical): No  Physical Activity: Not on file  Stress: Not on file  Social Connections: Not on file   Hospital Course:   During the patient's hospitalization, patient had extensive initial psychiatric evaluation, and follow-up psychiatric evaluations every day. Psychiatric diagnoses provided upon initial assessment are as listed  above. Patient's psychiatric medications were adjusted on admission as follows: -Continue Zoloft 50 mg daily for depressive symptoms and GAD (restarted by admitting provider) -Continue trazodone 50 mg and switch to nightly instead of as needed for sleep -Start CIWA scores every 6 hours with 1 mg coverages for CIWA scores >10 for mild alcohol use disorder -Start nicotine patch 21 mg every 24 hours for nicotine dependence -Start bacitracin ointment twice daily to left upper thigh area for infection prophylaxis.  Patient reports that he has had tetanus shot within the last 5 years and that  information was verified in the ED. -Start hydroxyzine 25 mg 3 times daily as needed for anxiety -Start Nicorette gum every 2 hours for nicotine dependence   During the hospitalization, other adjustments were made to the patient's psychiatric medication regimen. Medications at discharge are as follows: -Continue Zoloft 100 mg daily for depressive symptoms & GAD  -Continue trazodone 50 mg and switch to nightly for sleep -Continue nicotine patch 21 mg every 24 hours for nicotine dependence -Continue bacitracin ointment twice daily to left upper thigh area for infection prophylaxis.   -Continue hydroxyzine 25 mg 3 times daily as needed for anxiety   Patient's care was discussed during the interdisciplinary team meeting every day during the hospitalization. The patient denies having side effects to prescribed psychiatric medication. Gradually, patient started adjusting to milieu. The patient was evaluated each day by a clinical provider to ascertain response to treatment. Improvement was noted by the patient's report of decreasing symptoms, improved sleep and appetite, affect, medication tolerance, behavior, and participation in unit programming.  Patient was asked each day to complete a self inventory noting mood, mental status, pain, new symptoms, anxiety and concerns.     Symptoms were reported as significantly decreased or resolved completely by discharge. On day of discharge, the patient reports that their mood is stable. The patient denied having suicidal thoughts for more than 48 hours prior to discharge.  Patient denies having homicidal thoughts.  Patient denies having auditory hallucinations.  Patient denies any visual hallucinations or other symptoms of psychosis. The patient was motivated to continue taking medication with a goal of continued improvement in mental health.    The patient reports their target psychiatric symptoms of depression, anxiety & insomnia responded well to the psychiatric  medications, and the patient reports overall benefit from this psychiatric hospitalization. Supportive psychotherapy was provided to the patient. The patient also participated in regular group therapy while hospitalized. Coping skills, problem solving as well as relaxation therapies were also part of the unit programming.   Labs were reviewed with the patient, and abnormal results were discussed with the patient.   The patient is able to verbalize their individual safety plan to this provider.   # It is recommended to the patient to continue psychiatric medications as prescribed, after discharge from the hospital.     # It is recommended to the patient to follow up with your outpatient psychiatric provider and PCP.   # It was discussed with the patient, the impact of alcohol, drugs, tobacco have been there overall psychiatric and medical wellbeing, and total abstinence from substance use was recommended the patient.ed.   # Prescriptions provided or sent directly to preferred pharmacy at discharge. Patient agreeable to plan. Given opportunity to ask questions. Appears to feel comfortable with discharge.    # In the event of worsening symptoms, the patient is instructed to call the crisis hotline, 911 and or go to the nearest ED  for appropriate evaluation and treatment of symptoms. To follow-up with primary care provider for other medical issues, concerns and or health care needs   # Patient was discharged to this girlfriend's home with a plan to follow up as noted below.  Total Time spent with patient: 45 minutes   Physical Findings: AIMS: 0 CIWA:  CIWA-Ar Total: 0 COWS: n/a  Musculoskeletal: Strength & Muscle Tone: within normal limits Gait & Station: normal Patient leans: N/A Psychiatric Specialty Exam:  Presentation  General Appearance:  Appropriate for Environment; Fairly Groomed  Eye Contact: Fair  Speech: Clear and Coherent  Speech  Volume: Normal  Handedness: Right   Mood and Affect  Mood: Euthymic  Affect: Appropriate; Congruent   Thought Process  Thought Processes: Coherent  Descriptions of Associations:Intact  Orientation:Full (Time, Place and Person)  Thought Content:Logical  History of Schizophrenia/Schizoaffective disorder:No  Duration of Psychotic Symptoms:No data recorded Hallucinations:Hallucinations: None  Ideas of Reference:None  Suicidal Thoughts:Suicidal Thoughts: No  Homicidal Thoughts:Homicidal Thoughts: No   Sensorium  Memory: Immediate Good  Judgment: Good  Insight: Good   Executive Functions  Concentration: Good  Attention Span: Good  Recall: Good  Fund of Knowledge: Good  Language: Good   Psychomotor Activity  Psychomotor Activity: Psychomotor Activity: Normal   Assets  Assets: Communication Skills   Sleep  Sleep: Sleep: Good    Physical Exam: Physical Exam Vitals reviewed.  Constitutional:      Appearance: Normal appearance.  Musculoskeletal:        General: Normal range of motion.     Cervical back: Normal range of motion.  Neurological:     Mental Status: He is alert.    Review of Systems  Constitutional: Negative.   HENT: Negative.    Eyes: Negative.   Respiratory: Negative.    Cardiovascular: Negative.   Genitourinary: Negative.   Musculoskeletal: Negative.   Skin: Negative.   Psychiatric/Behavioral:  Positive for depression (Denies SI/HI, denies intent or plan to harm self or any one esle outside of [place]) and substance abuse (Educated on the negative impact of substance use on his mental heaLth). Negative for hallucinations, memory loss and suicidal ideas. The patient is nervous/anxious (improving on current meds) and has insomnia (Improving on current meds).    Blood pressure 110/69, pulse (!) 57, temperature 98.3 F (36.8 C), temperature source Oral, resp. rate 13, height 5\' 8"  (1.727 m), weight 74.5 kg,  SpO2 98%. Body mass index is 24.97 kg/m.   Social History   Tobacco Use  Smoking Status Every Day   Current packs/day: 1.00   Types: Cigarettes  Smokeless Tobacco Never   Tobacco Cessation:  A prescription for an FDA-approved tobacco cessation medication provided at discharge   Blood Alcohol level:  Lab Results  Component Value Date   ETH 39 (H) 11/17/2022   ETH <10 07/30/2022    Metabolic Disorder Labs:  Lab Results  Component Value Date   HGBA1C 5.5 11/20/2022   MPG 111.15 11/20/2022   MPG 116.89 07/30/2022   No results found for: "PROLACTIN" Lab Results  Component Value Date   CHOL 171 11/20/2022   TRIG 192 (H) 11/20/2022   HDL 37 (L) 11/20/2022   CHOLHDL 4.6 11/20/2022   VLDL 38 11/20/2022   LDLCALC 96 11/20/2022   LDLCALC 110 (H) 07/30/2022    See Psychiatric Specialty Exam and Suicide Risk Assessment completed by Attending Physician prior to discharge.  Discharge destination:  Home  Is patient on multiple antipsychotic therapies at discharge:  No  Has Patient had three or more failed trials of antipsychotic monotherapy by history:  No  Recommended Plan for Multiple Antipsychotic Therapies: NA   Allergies as of 11/21/2022   No Known Allergies      Medication List     TAKE these medications      Indication  hydrOXYzine 25 MG tablet Commonly known as: ATARAX Take 1 tablet (25 mg total) by mouth every 8 (eight) hours as needed for anxiety.  Indication: Feeling Anxious   neomycin-bacitracin-polymyxin Oint Commonly known as: NEOSPORIN Apply 1 Application topically 2 (two) times daily.  Indication: Infection prophylaxis   nicotine 21 mg/24hr patch Commonly known as: NICODERM CQ - dosed in mg/24 hours Place 1 patch (21 mg total) onto the skin daily. Start taking on: November 22, 2022  Indication: Nicotine Addiction   sertraline 100 MG tablet Commonly known as: ZOLOFT Take 1 tablet (100 mg total) by mouth daily. Start taking on: November 22, 2022 What changed:  medication strength how much to take  Indication: Major Depressive Disorder   traZODone 50 MG tablet Commonly known as: DESYREL Take 1 tablet (50 mg total) by mouth at bedtime. What changed:  when to take this reasons to take this  Indication: Trouble Sleeping        Follow-up Information     [facility]. Go to.   Specialty: Behavioral Health Why: You have a SCHEDULED  follow up appointment with this provider for medication management 11/28/2022 @ 9:00AM for therapy 01/15/2023 11:00AM for therapy. Please go to this provider for a sooner appointment for therapy services and arrive by 7:00 Monday-Friday. Contact information: [address] [place] [address] [phone]        [facility]. Call.   Why: A referral was sent on your behalf to this residential, please follow up after DC, if you are still interested in this substance abuse program. Contact information: [address] [place] [address] [phone]         [facility] Call.   Specialty: Addiction Medicine Why: A referral was sent on your behalf to this residential, please follow up after DC, if you are still interested in this substance abuse program. Contact information: [address] [place] [address] [phone]                Signed: Starleen Blue, NP 11/21/2022, 11:46 AM

## 2022-11-21 NOTE — [facility] Suicide Risk Assessment (Deleted)
Suicide Risk Assessment  Discharge Assessment    [facility] Discharge Suicide Risk Assessment   Principal Problem: MDD (major depressive disorder), recurrent severe, without psychosis (HCC) Discharge Diagnoses: Principal Problem:   MDD (major depressive disorder), recurrent severe, without psychosis (HCC) Active Problems:   Alcohol use disorder   Delta-9-tetrahydrocannabinol (THC) dependence (HCC)   Cocaine use disorder (HCC)   Mild alcohol use disorder   Insomnia  Reason For Admission: Jonathan [name] is a 27 yo AAM with prior mental health diagnoses of MDD, GAD & cocaine use d/o who presented to the [facility] ER on 8/17 with complaints of worsening depressive symptoms & SI. Pt also made multiple cuts to his left upper thigh area with a piece of broken glass as per his reports prior to presentation to the ER in an effort to release stress. Pt was transferred voluntarily to this [facility] for treatment and stabilization of his mental status.   During the patient's hospitalization, patient had extensive initial psychiatric evaluation, and follow-up psychiatric evaluations every day. Psychiatric diagnoses provided upon initial assessment are as listed above. Patient's psychiatric medications were adjusted on admission as follows: -Continue Zoloft 50 mg daily for depressive symptoms and GAD (restarted by admitting provider) -Continue trazodone 50 mg and switch to nightly instead of as needed for sleep -Start CIWA scores every 6 hours with 1 mg coverages for CIWA scores >10 for mild alcohol use disorder -Start nicotine patch 21 mg every 24 hours for nicotine dependence -Start bacitracin ointment twice daily to left upper thigh area for infection prophylaxis.  Patient reports that he has had tetanus shot within the last 5 years and that information was verified in the ED. -Start hydroxyzine 25 mg 3 times daily as needed for anxiety -Start Nicorette gum every 2 hours for nicotine dependence   During  the hospitalization, other adjustments were made to the patient's psychiatric medication regimen. Medications at discharge are as follows: -Continue Zoloft 100 mg daily for depressive symptoms & GAD  -Continue trazodone 50 mg and switch to nightly for sleep -Continue nicotine patch 21 mg every 24 hours for nicotine dependence -Continue bacitracin ointment twice daily to left upper thigh area for infection prophylaxis.   -Continue hydroxyzine 25 mg 3 times daily as needed for anxiety  Patient's care was discussed during the interdisciplinary team meeting every day during the hospitalization. The patient denies having side effects to prescribed psychiatric medication. Gradually, patient started adjusting to milieu. The patient was evaluated each day by a clinical provider to ascertain response to treatment. Improvement was noted by the patient's report of decreasing symptoms, improved sleep and appetite, affect, medication tolerance, behavior, and participation in unit programming.  Patient was asked each day to complete a self inventory noting mood, mental status, pain, new symptoms, anxiety and concerns.    Symptoms were reported as significantly decreased or resolved completely by discharge. On day of discharge, the patient reports that their mood is stable. The patient denied having suicidal thoughts for more than 48 hours prior to discharge.  Patient denies having homicidal thoughts.  Patient denies having auditory hallucinations.  Patient denies any visual hallucinations or other symptoms of psychosis. The patient was motivated to continue taking medication with a goal of continued improvement in mental health.   The patient reports their target psychiatric symptoms of depression, anxiety & insomnia responded well to the psychiatric medications, and the patient reports overall benefit from this psychiatric hospitalization. Supportive psychotherapy was provided to the patient. The patient also  participated in regular group therapy while hospitalized. Coping skills, problem solving as well as relaxation therapies were also part of the unit programming.  Labs were reviewed with the patient, and abnormal results were discussed with the patient.  The patient is able to verbalize their individual safety plan to this provider.  # It is recommended to the patient to continue psychiatric medications as prescribed, after discharge from the hospital.    # It is recommended to the patient to follow up with your outpatient psychiatric provider and PCP.  # It was discussed with the patient, the impact of alcohol, drugs, tobacco have been there overall psychiatric and medical wellbeing, and total abstinence from substance use was recommended the patient.ed.  # Prescriptions provided or sent directly to preferred pharmacy at discharge. Patient agreeable to plan. Given opportunity to ask questions. Appears to feel comfortable with discharge.    # In the event of worsening symptoms, the patient is instructed to call the crisis hotline, 911 and or go to the nearest ED for appropriate evaluation and treatment of symptoms. To follow-up with primary care provider for other medical issues, concerns and or health care needs  # Patient was discharged to this girlfriend's home with a plan to follow up as noted below.  Total Time spent with patient: 45 minutes  Musculoskeletal: Strength & Muscle Tone: within normal limits Gait & Station: normal Patient leans: N/A  Psychiatric Specialty Exam  Presentation  General Appearance:  Appropriate for Environment; Fairly Groomed  Eye Contact: Fair  Speech: Clear and Coherent  Speech Volume: Normal  Handedness: Right   Mood and Affect  Mood: Euthymic  Duration of Depression Symptoms: Greater than two weeks  Affect: Appropriate; Congruent   Thought Process  Thought Processes: Coherent  Descriptions of  Associations:Intact  Orientation:Full (Time, Place and Person)  Thought Content:Logical  History of Schizophrenia/Schizoaffective disorder:No  Duration of Psychotic Symptoms:No data recorded Hallucinations:Hallucinations: None  Ideas of Reference:None  Suicidal Thoughts:Suicidal Thoughts: No  Homicidal Thoughts:Homicidal Thoughts: No   Sensorium  Memory: Immediate Good  Judgment: Good  Insight: Good   Executive Functions  Concentration: Good  Attention Span: Good  Recall: Good  Fund of Knowledge: Good  Language: Good   Psychomotor Activity  Psychomotor Activity: Psychomotor Activity: Normal   Assets  Assets: Communication Skills   Sleep  Sleep: Sleep: Good   Physical Exam: Physical Exam Constitutional:      Appearance: Normal appearance.  Musculoskeletal:        General: Normal range of motion.     Cervical back: Normal range of motion.  Neurological:     Mental Status: He is alert and oriented to person, place, and time.    Review of Systems  Constitutional: Negative.   HENT: Negative.    Eyes: Negative.   Respiratory: Negative.    Cardiovascular: Negative.   Gastrointestinal: Negative.   Genitourinary: Negative.   Musculoskeletal: Negative.   Skin: Negative.   Neurological: Negative.  Negative for dizziness.  Psychiatric/Behavioral:  Positive for depression (Denies SI/HI, denies plan or intent to harm self or any one else) and substance abuse (Educated on the negative impact of substance use on his mental health and on the need to abstain from using). Negative for hallucinations, memory loss and suicidal ideas. The patient is nervous/anxious (Resolving on current meds) and has insomnia (Resolving on current meds).    Blood pressure 110/69, pulse (!) 57, temperature 98.3 F (36.8 C), temperature source Oral, resp. rate 13, height 5\' 8"  (1.727  m), weight 74.5 kg, SpO2 98%. Body mass index is 24.97 kg/m.  Mental Status Per  Nursing Assessment::   On Admission:  Suicidal ideation indicated by patient  Demographic Factors:  Male, Low socioeconomic status, and Unemployed  Loss Factors: Financial problems/change in socioeconomic status  Historical Factors: Family history of mental illness or substance abuse  Risk Reduction Factors:   Living with another person, especially a relative-Going to reside with his  girlfriend after discharge as per him.  Continued Clinical Symptoms:  Alcohol/Substance Abuse/Dependencies More than one psychiatric diagnosis Previous Psychiatric Diagnoses and Treatments  Cognitive Features That Contribute To Risk:  None    Suicide Risk:  Mild:  There are no identifiable suicide plans, no associated intent, mild dysphoria and related symptoms, good self-control (both objective and subjective assessment), few other risk factors, and identifiable protective factors, including available and accessible social support.    Follow-up Information     [facility]. Go to.   Specialty: Behavioral Health Why: Please go to this provider for an assessment, to obtain therapy and medication management services. For fastest service, please go on Monday through Friday, arrive by 7:00 am to be seen same day. Contact information: [address] [place] [address] [phone]        [facility] Follow up.   Why: Referral made Contact information: [address] [place] [address] [phone]         [facility] Follow up.   Specialty: Addiction Medicine Why: Referral made Contact information: [address] [place] [address] [phone]                Starleen Blue, NP 11/21/2022, 9:11 AM

## 2022-11-21 NOTE — Group Note (Signed)
Recreation Therapy Group Note   Group Topic:Problem Solving  Group Date: 11/21/2022 Start Time: 0935 End Time: 1005 Facilitators: Kimmy Parish-McCall, LRT,CTRS Location: [address]   Goal Area(s) Addresses:  Patient will effectively work with peer towards shared goal.  Patient will identify skills used to make activity successful.  Patient will share challenges and verbalize solution-driven approaches used. Patient will identify how skills used during activity can be used to reach post d/c goals.   Group Description: [employer]. Patients were provided the following materials: 4 drinking straws, 5 rubber bands, 5 paper clips, 2 index cards, 2 drinking cups, and 2 toilet paper rolls. Using the provided materials patients were asked to build a launching mechanism to launch a ping pong ball across the room, approximately 10 feet. Patients were divided into teams of 3-5. Instructions required all materials be incorporated into the device, functionality of items left to the peer group's discretion.   Affect/Mood: Flat   Participation Level: None    Clinical Observations/Individualized Feedback: Pt didn't participate in activity but observed. Pt was also social with peers.    Plan: Continue to engage patient in RT group sessions 2-3x/week.   Taneil Lazarus-McCall, LRT,CTRS 11/21/2022 1:12 PM

## 2022-11-21 NOTE — Progress Notes (Signed)
Patient verbalizes readiness for discharge. All patient belongings returned to patient. Discharge instructions read and discussed with patient (appointments, medications, resources). Patient expressed gratitude for care provided. Patient discharged to lobby at 1357 where girlfriend was waiting.

## 2022-11-21 NOTE — Progress Notes (Signed)
  [facility] Adult Case Management Discharge Plan :  Will you be returning to the same living situation after discharge:  Yes,  Patient will be returning back to GF house  At discharge, do you have transportation home?: Yes,  Patient GF will pick him up between 12:00-1:15pm Do you have the ability to pay for your medications: Yes,  [employer] Plan   Release of information consent forms completed and in the chart;  Patient's signature needed at discharge.  Patient to Follow up at:  Follow-up Information     [facility]. Go to.   Specialty: Behavioral Health Why: You have a SCHEDULED  follow up appointment with this provider for medication management 11/28/2022 @ 9:00AM for therapy 01/15/2023 11:00AM for therapy. Please go to this provider for a sooner appointment for therapy services and arrive by 7:00 Monday-Friday. Contact information: [address] [place] [address] [phone]        Services[facility]. Call.   Why: A referral was sent on your behalf to this residential, please follow up after DC, if you are still interested in this substance abuse program. Contact information: [address] [place] [address] [phone]         [facility] Call.   Specialty: Addiction Medicine Why: A referral was sent on your behalf to this residential, please follow up after DC, if you are still interested in this substance abuse program. Contact information: [address] [place] [address] [phone]                 Next level of care provider has access to [facility] Link:yes  Safety Planning and Suicide Prevention discussed: Yes; Meryl Dare (Girlfriend) [phone]    Has patient been referred to the Quitline?: Patient refused referral for treatment  Patient has been referred for addiction treatment: Yes, referral information given but appointment not made to  [facility] in [facility] and [facility] in [place] (list facility).  Isabella Bowens, LCSWA 11/21/2022, 10:01 AM

## 2022-11-21 NOTE — BHH Group Notes (Signed)
Spirituality group facilitated by Kathleen Argue, BCC.  Group Description: Group focused on topic of hope. Patients participated in facilitated discussion around topic, connecting with one another around experiences and definitions for hope. Group members engaged with visual explorer photos, reflecting on what hope looks like for them today. Group engaged in discussion around how their definitions of hope are present today in hospital.  Modalities: Psycho-social ed, Adlerian, Narrative, MI  Patient Progress: Jonathan [name] attended group and participated in group conversation and activities.  Though verbal participation was minimal, he remained engaged with the group.

## 2022-11-21 NOTE — BHH Suicide Risk Assessment (Signed)
Suicide Risk Assessment  Discharge Assessment    [facility] Discharge Suicide Risk Assessment   Principal Problem: MDD (major depressive disorder), recurrent severe, without psychosis (HCC) Discharge Diagnoses: Principal Problem:   MDD (major depressive disorder), recurrent severe, without psychosis (HCC) Active Problems:   Alcohol use disorder   Delta-9-tetrahydrocannabinol (THC) dependence (HCC)   Cocaine use disorder (HCC)   Mild alcohol use disorder   Insomnia  Reason For Admission: Jonathan [name] is a 27 yo AAM with prior mental health diagnoses of MDD, GAD & cocaine use d/o who presented to the [facility] ER on 8/17 with complaints of worsening depressive symptoms & SI. Pt also made multiple cuts to his left upper thigh area with a piece of broken glass as per his reports prior to presentation to the ER in an effort to release stress. Pt was transferred voluntarily to this [facility] for treatment and stabilization of his mental status.   During the patient's hospitalization, patient had extensive initial psychiatric evaluation, and follow-up psychiatric evaluations every day. Psychiatric diagnoses provided upon initial assessment are as listed above. Patient's psychiatric medications were adjusted on admission as follows: -Continue Zoloft 50 mg daily for depressive symptoms and GAD (restarted by admitting provider) -Continue trazodone 50 mg and switch to nightly instead of as needed for sleep -Start CIWA scores every 6 hours with 1 mg coverages for CIWA scores >10 for mild alcohol use disorder -Start nicotine patch 21 mg every 24 hours for nicotine dependence -Start bacitracin ointment twice daily to left upper thigh area for infection prophylaxis.  Patient reports that he has had tetanus shot within the last 5 years and that information was verified in the ED. -Start hydroxyzine 25 mg 3 times daily as needed for anxiety -Start Nicorette gum every 2 hours for nicotine dependence   During  the hospitalization, other adjustments were made to the patient's psychiatric medication regimen. Medications at discharge are as follows: -Continue Zoloft 100 mg daily for depressive symptoms & GAD  -Continue trazodone 50 mg and switch to nightly for sleep -Continue nicotine patch 21 mg every 24 hours for nicotine dependence -Continue bacitracin ointment twice daily to left upper thigh area for infection prophylaxis.   -Continue hydroxyzine 25 mg 3 times daily as needed for anxiety  Patient's care was discussed during the interdisciplinary team meeting every day during the hospitalization. The patient denies having side effects to prescribed psychiatric medication. Gradually, patient started adjusting to milieu. The patient was evaluated each day by a clinical provider to ascertain response to treatment. Improvement was noted by the patient's report of decreasing symptoms, improved sleep and appetite, affect, medication tolerance, behavior, and participation in unit programming.  Patient was asked each day to complete a self inventory noting mood, mental status, pain, new symptoms, anxiety and concerns.    Symptoms were reported as significantly decreased or resolved completely by discharge. On day of discharge, the patient reports that their mood is stable. The patient denied having suicidal thoughts for more than 48 hours prior to discharge.  Patient denies having homicidal thoughts.  Patient denies having auditory hallucinations.  Patient denies any visual hallucinations or other symptoms of psychosis. The patient was motivated to continue taking medication with a goal of continued improvement in mental health.   The patient reports their target psychiatric symptoms of depression, anxiety & insomnia responded well to the psychiatric medications, and the patient reports overall benefit from this psychiatric hospitalization. Supportive psychotherapy was provided to the patient. The patient also  participated in regular group therapy while hospitalized. Coping skills, problem solving as well as relaxation therapies were also part of the unit programming.  Labs were reviewed with the patient, and abnormal results were discussed with the patient.  The patient is able to verbalize their individual safety plan to this provider.  # It is recommended to the patient to continue psychiatric medications as prescribed, after discharge from the hospital.    # It is recommended to the patient to follow up with your outpatient psychiatric provider and PCP.  # It was discussed with the patient, the impact of alcohol, drugs, tobacco have been there overall psychiatric and medical wellbeing, and total abstinence from substance use was recommended the patient.ed.  # Prescriptions provided or sent directly to preferred pharmacy at discharge. Patient agreeable to plan. Given opportunity to ask questions. Appears to feel comfortable with discharge.    # In the event of worsening symptoms, the patient is instructed to call the crisis hotline, 911 and or go to the nearest ED for appropriate evaluation and treatment of symptoms. To follow-up with primary care provider for other medical issues, concerns and or health care needs  # Patient was discharged to this girlfriend's home with a plan to follow up as noted below.  Total Time spent with patient: 45 minutes  Musculoskeletal: Strength & Muscle Tone: within normal limits Gait & Station: normal Patient leans: N/A  Psychiatric Specialty Exam  Presentation  General Appearance:  Appropriate for Environment; Fairly Groomed  Eye Contact: Fair  Speech: Clear and Coherent  Speech Volume: Normal  Handedness: Right   Mood and Affect  Mood: Euthymic  Duration of Depression Symptoms: Greater than two weeks  Affect: Appropriate; Congruent   Thought Process  Thought Processes: Coherent  Descriptions of  Associations:Intact  Orientation:Full (Time, Place and Person)  Thought Content:Logical  History of Schizophrenia/Schizoaffective disorder:No  Duration of Psychotic Symptoms:No data recorded Hallucinations:Hallucinations: None  Ideas of Reference:None  Suicidal Thoughts:Suicidal Thoughts: No  Homicidal Thoughts:Homicidal Thoughts: No   Sensorium  Memory: Immediate Good  Judgment: Good  Insight: Good   Executive Functions  Concentration: Good  Attention Span: Good  Recall: Good  Fund of Knowledge: Good  Language: Good   Psychomotor Activity  Psychomotor Activity: Psychomotor Activity: Normal   Assets  Assets: Communication Skills   Sleep  Sleep: Sleep: Good   Physical Exam: Physical Exam Constitutional:      Appearance: Normal appearance.  Musculoskeletal:        General: Normal range of motion.     Cervical back: Normal range of motion.  Neurological:     Mental Status: He is alert and oriented to person, place, and time.    Review of Systems  Constitutional: Negative.   HENT: Negative.    Eyes: Negative.   Respiratory: Negative.    Cardiovascular: Negative.   Gastrointestinal: Negative.   Genitourinary: Negative.   Musculoskeletal: Negative.   Skin: Negative.   Neurological: Negative.  Negative for dizziness.  Psychiatric/Behavioral:  Positive for depression (Denies SI/HI, denies plan or intent to harm self or any one else) and substance abuse (Educated on the negative impact of substance use on his mental health and on the need to abstain from using). Negative for hallucinations, memory loss and suicidal ideas. The patient is nervous/anxious (Resolving on current meds) and has insomnia (Resolving on current meds).    Blood pressure 110/69, pulse (!) 57, temperature 98.3 F (36.8 C), temperature source Oral, resp. rate 13, height 5\' 8"  (1.727  m), weight 74.5 kg, SpO2 98%. Body mass index is 24.97 kg/m.  Mental Status Per  Nursing Assessment::   On Admission:  Suicidal ideation indicated by patient  Demographic Factors:  Male, Low socioeconomic status, and Unemployed  Loss Factors: Financial problems/change in socioeconomic status  Historical Factors: Family history of mental illness or substance abuse  Risk Reduction Factors:   Living with another person, especially a relative-Going to reside with his  girlfriend after discharge as per him.  Continued Clinical Symptoms:  Alcohol/Substance Abuse/Dependencies More than one psychiatric diagnosis Previous Psychiatric Diagnoses and Treatments  Cognitive Features That Contribute To Risk:  None    Suicide Risk:  Mild:  There are no identifiable suicide plans, no associated intent, mild dysphoria and related symptoms, good self-control (both objective and subjective assessment), few other risk factors, and identifiable protective factors, including available and accessible social support.    Follow-up Information     [facility]. Go to.   Specialty: Behavioral Health Why: You have a SCHEDULED  follow up appointment with this provider for medication management 11/28/2022 @ 9:00AM for therapy 01/15/2023 11:00AM for therapy. Please go to this provider for a sooner appointment for therapy services and arrive by 7:00 Monday-Friday. Contact information: [address] [place] [address] [phone]        [facility]. Call.   Why: A referral was sent on your behalf to this residential, please follow up after DC, if you are still interested in this substance abuse program. Contact information: [address] [place] [address] [phone]         [facility] Call.   Specialty: Addiction Medicine Why: A referral was sent on your behalf to this residential, please follow up after DC, if you are still interested in this substance abuse program. Contact  information: [address] [place] [address] [phone]                Starleen Blue, NP 11/21/2022, 10:10 AM

## 2022-11-21 NOTE — Progress Notes (Signed)
D: Patient is alert, oriented, pleasant, and cooperative. Denies SI, HI, AVH, and verbally contracts for safety. Patient reports he slept good last night with sleeping medication. Patient reports his appetite as good, energy level as normal, and concentration as good. Patient rates his depression 2/10, hopelessness 1/10, and anxiety 1/10. Patient denies physical symptoms/pain.    A: Scheduled medications administered per MD order. Support provided. Patient educated on safety on the unit and medications. Routine safety checks every 15 minutes. Patient stated understanding to tell nurse about any new physical symptoms. Patient understands to tell staff of any needs.     R: No adverse drug reactions noted. Patient verbally contracts for safety. Patient remains safe at this time and will continue to monitor.    11/21/22 0900  Psych Admission Type (Psych Patients Only)  Admission Status Voluntary  Psychosocial Assessment  Patient Complaints Depression  Eye Contact Fair  Facial Expression Animated  Affect Anxious  Speech Logical/coherent  Interaction Assertive  Motor Activity Other (Comment) (WNL)  Appearance/Hygiene Unremarkable  Behavior Characteristics Cooperative  Mood Pleasant  Thought Process  Coherency WDL  Content WDL  Delusions None reported or observed  Perception WDL  Hallucination None reported or observed  Judgment Impaired  Confusion None  Danger to Self  Current suicidal ideation? Denies  Agreement Not to Harm Self Yes  Description of Agreement verbal  Danger to Others  Danger to Others None reported or observed

## 2022-11-21 NOTE — BHH Counselor (Signed)
BHH/BMU LCSW Progress Note   11/21/2022    10:13 AM  Jonathan [name]      Type of Note: Follow up appointment and residential information   CSW reviewed this information with patient and asked him if it was fine for CSW to send this information to his girlfriend email, because she asked for it and patient verbally shared that CSW could send information to his girlfriend.     Signed:   Jacob Moores, MSW, [facility] 11/21/2022 10:13 AM

## 2022-11-21 NOTE — BHH Suicide Risk Assessment (Addendum)
BHH INPATIENT:  Family/Significant Other Suicide Prevention Education  Suicide Prevention Education:  Education Completed; Jonathan [name] (Girlfriend) [phone],  (name of family member/significant other) has been identified by the patient as the family member/significant other with whom the patient will be residing, and identified as the person(s) who will aid the patient in the event of a mental health crisis (suicidal ideations/suicide attempt).  With written consent from the patient, the family member/significant other has been provided the following suicide prevention education, prior to the and/or following the discharge of the patient.  CSW spoke with girlfriend and completed safety planning. CSW explained to GF what referrals were made and what patient would need to do for his follow up since [facility] appointments are booked out until October. GF shared that she will make sure that he follows up with them. Also shared the information with her about the residential referrals for [facility] and [facility]. Girlfriend confirmed that she did not have any access to guns or weapons in her home.   The suicide prevention education provided includes the following: Suicide risk factors Suicide prevention and interventions National Suicide Hotline telephone number [facility] assessment telephone number [facility] Emergency Assistance 911 [facility] and/or Residential Mobile Crisis Unit telephone number  Request made of family/significant other to: Remove weapons (e.g., guns, rifles, knives), all items previously/currently identified as safety concern.   Remove drugs/medications (over-the-counter, prescriptions, illicit drugs), all items previously/currently identified as a safety concern.  The family member/significant other verbalizes understanding of the suicide prevention education information provided.  The family member/significant other agrees to remove the items of safety concern  listed above.  Isabella Bowens 11/21/2022, 9:41 AM

## 2022-11-28 ENCOUNTER — Ambulatory Visit (HOSPITAL_COMMUNITY): Payer: MEDICAID | Admitting: Family

## 2022-12-04 ENCOUNTER — Ambulatory Visit (HOSPITAL_COMMUNITY): Payer: MEDICAID | Admitting: Psychiatry

## 2022-12-20 ENCOUNTER — Emergency Department (HOSPITAL_BASED_OUTPATIENT_CLINIC_OR_DEPARTMENT_OTHER): Payer: MEDICAID | Admitting: Radiology

## 2022-12-20 ENCOUNTER — Other Ambulatory Visit: Payer: Self-pay

## 2022-12-20 ENCOUNTER — Emergency Department (HOSPITAL_BASED_OUTPATIENT_CLINIC_OR_DEPARTMENT_OTHER)
Admission: EM | Admit: 2022-12-20 | Discharge: 2022-12-20 | Disposition: A | Discharge status: Home / Self Care | Payer: MEDICAID | Attending: Emergency Medicine | Admitting: Emergency Medicine

## 2022-12-20 ENCOUNTER — Encounter (HOSPITAL_BASED_OUTPATIENT_CLINIC_OR_DEPARTMENT_OTHER): Payer: Self-pay | Admitting: Emergency Medicine

## 2022-12-20 ENCOUNTER — Other Ambulatory Visit (HOSPITAL_BASED_OUTPATIENT_CLINIC_OR_DEPARTMENT_OTHER): Payer: Self-pay

## 2022-12-20 DIAGNOSIS — R079 Chest pain, unspecified: Secondary | ICD-10-CM | POA: Insufficient documentation

## 2022-12-20 LAB — BASIC METABOLIC PANEL
Anion gap: 7 (ref 5–15)
BUN: 16 mg/dL (ref 6–20)
CO2: 26 mmol/L (ref 22–32)
Calcium: 8.3 mg/dL — ABNORMAL LOW (ref 8.9–10.3)
Chloride: 106 mmol/L (ref 98–111)
Creatinine, Ser: 1.15 mg/dL (ref 0.61–1.24)
GFR, Estimated: >60 mL/min (ref >60)
Glucose, Bld: 88 mg/dL (ref 70–99)
Potassium: 3.8 mmol/L (ref 3.5–5.1)
Sodium: 139 mmol/L (ref 135–145)

## 2022-12-20 LAB — CBC
HCT: 40.7 % (ref 39.0–52.0)
Hemoglobin: 14.3 g/dL (ref 13.0–17.0)
MCH: 31.4 pg (ref 26.0–34.0)
MCHC: 35.1 g/dL (ref 30.0–36.0)
MCV: 89.3 fL (ref 80.0–100.0)
Platelets: 254 10*3/uL (ref 150–400)
RBC: 4.56 MIL/uL (ref 4.22–5.81)
RDW: 13.5 % (ref 11.5–15.5)
WBC: 9.3 10*3/uL (ref 4.0–10.5)
nRBC: 0 % (ref 0.0–0.2)

## 2022-12-20 LAB — RAPID URINE DRUG SCREEN, HOSP PERFORMED
Amphetamines: NOT DETECTED
Barbiturates: NOT DETECTED
Benzodiazepines: NOT DETECTED
Cocaine: POSITIVE — AB
Opiates: NOT DETECTED
Tetrahydrocannabinol: POSITIVE — AB

## 2022-12-20 LAB — TROPONIN I (HIGH SENSITIVITY): Troponin I (High Sensitivity): 3 ng/L (ref <18)

## 2022-12-20 MED ORDER — NAPROXEN 500 MG PO TABS
500.0000 mg | ORAL_TABLET | Freq: Two times a day (BID) | ORAL | 0 refills | Status: DC
  Filled 2022-12-20: qty 30, 15d supply, fill #0

## 2022-12-20 MED ORDER — ACETAMINOPHEN 500 MG PO TABS
1000.0000 mg | ORAL_TABLET | Freq: Once | ORAL | Status: AC
  Administered 2022-12-20: 1000 mg via ORAL
  Filled 2022-12-20: qty 2

## 2022-12-20 NOTE — ED Provider Notes (Signed)
[place] EMERGENCY DEPARTMENT AT [facility] Provider Note   CSN: [id number] Arrival date & time: 12/20/22  0846     History  Chief Complaint  Patient presents with   Chest Pain    Jonathan [name] is a 27 y.o. male with past medical history of bipolar 1 disorder presents today for evaluation of chest pain.  Patient reports that chest pain started yesterday when he was smoking.  Patient reports cocaine use the day before yesterday.  Pain is in the center and on the left side of his chest, nonradiating.  He denies any neck pain, jaw pain, arm or shoulder pain, back pain or diaphoresis.  He states that the pain got worse when he lays down or changing position or cough.  He denies fever, nausea, vomiting, abdominal pain, bowel change, urinary symptoms, rash.   Chest Pain   Past Medical History:  Diagnosis Date   Bipolar 1 disorder (HCC)    History reviewed. No pertinent surgical history.   Home Medications Prior to Admission medications   Medication Sig Start Date End Date Taking? Authorizing Provider  hydrOXYzine (ATARAX) 25 MG tablet Take 1 tablet (25 mg total) by mouth every 8 (eight) hours as needed for anxiety. 11/21/22   Starleen Blue, NP  neomycin-bacitracin-polymyxin (NEOSPORIN) OINT Apply 1 Application topically 2 (two) times daily. 11/21/22   Starleen Blue, NP  nicotine (NICODERM CQ - DOSED IN MG/24 HOURS) 21 mg/24hr patch Place 1 patch (21 mg total) onto the skin daily. 11/22/22   Starleen Blue, NP  sertraline (ZOLOFT) 100 MG tablet Take 1 tablet (100 mg total) by mouth daily. 11/22/22   Starleen Blue, NP  traZODone (DESYREL) 50 MG tablet Take 1 tablet (50 mg total) by mouth at bedtime. 11/21/22   Starleen Blue, NP      Allergies    Patient has no known allergies.    Review of Systems   Review of Systems  Cardiovascular:  Positive for chest pain.    Physical Exam Updated Vital Signs BP 101/70 (BP Location: Right Arm)   Pulse 63   Temp 98 F (36.7  C) (Oral)   Resp 18   Wt 78.5 kg   SpO2 98%   BMI 26.30 kg/m  Physical Exam Vitals and nursing note reviewed.  Constitutional:      Appearance: Normal appearance.  HENT:     Head: Normocephalic and atraumatic.     Mouth/Throat:     Mouth: Mucous membranes are moist.  Eyes:     General: No scleral icterus. Cardiovascular:     Rate and Rhythm: Normal rate and regular rhythm.     Pulses: Normal pulses.     Heart sounds: Normal heart sounds.  Pulmonary:     Effort: Pulmonary effort is normal.     Breath sounds: Normal breath sounds.  Abdominal:     General: Abdomen is flat.     Palpations: Abdomen is soft.     Tenderness: There is no abdominal tenderness.  Musculoskeletal:        General: No deformity.  Skin:    General: Skin is warm.     Findings: No rash.  Neurological:     General: No focal deficit present.     Mental Status: He is alert.  Psychiatric:        Mood and Affect: Mood normal.     ED Results / Procedures / Treatments   Labs (all labs ordered are listed, but only abnormal results are displayed) Labs Reviewed -  No data to display  EKG EKG Interpretation Date/Time:  Thursday December 20 2022 08:55:25 EDT Ventricular Rate:  62 PR Interval:  139 QRS Duration:  90 QT Interval:  402 QTC Calculation: 409 R Axis:   45  Text Interpretation: Sinus rhythm Confirmed by Cathren Laine ([id number]) on 12/20/2022 9:05:53 AM  Radiology No results found.  Procedures Procedures    Medications Ordered in ED Medications - No data to display  ED Course/ Medical Decision Making/ A&P                                 Medical Decision Making Amount and/or Complexity of Data Reviewed Labs: ordered. Radiology: ordered.  Risk OTC drugs. Prescription drug management.   This patient presents to the ED for chest pain, this involves an extensive number of treatment options, and is a complaint that carries with a high risk of complications and morbidity.  The  differential diagnosis includes ACS, pericarditis, PE, pneumothorax, pneumonia, less likely dissection with essentially normal blood pressure, symmetric bilateral pulses, and no back pain.  This is not an exhaustive list.  Lab tests: I ordered and personally interpreted labs.  The pertinent results include: WBC unremarkable. Hbg unremarkable. Platelets unremarkable. Electrolytes unremarkable. BUN, creatinine unremarkable.  UDS is positive for cocaine and cannabinoids.  Imaging studies: I ordered imaging studies, personally reviewed, interpreted imaging and agree with the radiologist's interpretations. The results include: Negative chest x-ray.  Problem list/ ED course/ Critical interventions/ Medical management: HPI: See above Vital signs within normal range and stable throughout visit. Laboratory/imaging studies significant for: See above. On physical examination, patient is afebrile and appears in no acute distress. Exam without evidence of volume overload so doubt heart failure. EKG without signs of active ischemia. Given the timing of pain to ER presentation, single troponin was negative so doubt NSTEMI. Presentation not consistent with acute PE (PERC negative).  Chest x-ray showed no evidence of pneumonia or pneumothorax.  Unlikely myocarditis, patient has no recent illness, troponin is negative. HEART score: 1 so plan to discharge patient home with cardiology follow-up. I have reviewed the patient home medicines and have made adjustments as needed.  Cardiac monitoring/EKG: The patient was maintained on a cardiac monitor.  I personally reviewed and interpreted the cardiac monitor which showed an underlying rhythm of: sinus rhythm.  Additional history obtained: External records from outside source obtained and reviewed including: Chart review including previous notes, labs, imaging.  Consultations obtained:  Disposition Continued outpatient therapy. Follow-up with cardiology recommended  for reevaluation of symptoms. Treatment plan discussed with patient.  Pt acknowledged understanding was agreeable to the plan. Worrisome signs and symptoms were discussed with patient, and patient acknowledged understanding to return to the ED if they noticed these signs and symptoms. Patient was stable upon discharge.   This chart was dictated using voice recognition software.  Despite best efforts to proofread,  errors can occur which can change the documentation meaning.          Final Clinical Impression(s) / ED Diagnoses Final diagnoses:  Chest pain, unspecified type    Rx / DC Orders ED Discharge Orders          Ordered    Ambulatory referral to Cardiology       Comments: If you have not heard from the Cardiology office within the next 72 hours please call [phone].   12/20/22 1250    naproxen (NAPROSYN) 500 MG tablet  2 times daily        12/20/22 1250              Jeanelle Malling, [place] 12/20/22 1255    Cathren Laine, MD 12/25/22 1505

## 2022-12-20 NOTE — ED Notes (Signed)
Pt discharged to home using teachback Method. Discharge instructions have been discussed with patient and/or family members. Pt verbally acknowledges understanding d/c instructions, has been given opportunity for questions to be answered, and endorses comprehension to checkout at registration before leaving.

## 2022-12-20 NOTE — Discharge Instructions (Signed)
Please take tylenol/ibuprofen/naproxen for pain. I recommend close follow-up with cardiology for reevaluation.  Please do not hesitate to return to emergency department if worrisome signs symptoms we discussed become apparent.

## 2022-12-20 NOTE — ED Notes (Signed)
Pt aware of the need for a urine... Unable to currently provide the sample.Marland KitchenMarland KitchenMarland Kitchen

## 2022-12-20 NOTE — ED Notes (Signed)
Patient asked for to see nursing. When entering the room patient asked for an update and if he could be d/c with a work note. Provider notified

## 2022-12-20 NOTE — ED Triage Notes (Signed)
Pt arrives pov, steady gait with c/o mid sternal CP and cough starting yesterday. Reports pain worsens when lying down.

## 2022-12-20 NOTE — ED Notes (Signed)
ED Provider at bedside. 

## 2022-12-29 ENCOUNTER — Other Ambulatory Visit: Payer: Self-pay

## 2022-12-29 ENCOUNTER — Ambulatory Visit (HOSPITAL_COMMUNITY)
Admission: EM | Admit: 2022-12-29 | Discharge: 2022-12-31 | Disposition: A | Discharge status: Other Acute Inpatient Hospital | Payer: MEDICAID | Attending: Psychiatric/Mental Health | Admitting: Psychiatric/Mental Health

## 2022-12-29 DIAGNOSIS — F332 Major depressive disorder, recurrent severe without psychotic features: Secondary | ICD-10-CM | POA: Diagnosis present

## 2022-12-29 DIAGNOSIS — F319 Bipolar disorder, unspecified: Secondary | ICD-10-CM | POA: Diagnosis not present

## 2022-12-29 DIAGNOSIS — F1994 Other psychoactive substance use, unspecified with psychoactive substance-induced mood disorder: Secondary | ICD-10-CM | POA: Diagnosis present

## 2022-12-29 DIAGNOSIS — F1410 Cocaine abuse, uncomplicated: Secondary | ICD-10-CM | POA: Diagnosis present

## 2022-12-29 DIAGNOSIS — F1910 Other psychoactive substance abuse, uncomplicated: Secondary | ICD-10-CM | POA: Diagnosis not present

## 2022-12-29 DIAGNOSIS — F1090 Alcohol use, unspecified, uncomplicated: Secondary | ICD-10-CM | POA: Diagnosis present

## 2022-12-29 DIAGNOSIS — F1414 Cocaine abuse with cocaine-induced mood disorder: Secondary | ICD-10-CM | POA: Insufficient documentation

## 2022-12-29 DIAGNOSIS — F331 Major depressive disorder, recurrent, moderate: Secondary | ICD-10-CM

## 2022-12-29 DIAGNOSIS — Z9151 Personal history of suicidal behavior: Secondary | ICD-10-CM | POA: Insufficient documentation

## 2022-12-29 DIAGNOSIS — Z91148 Patient's other noncompliance with medication regimen for other reason: Secondary | ICD-10-CM | POA: Insufficient documentation

## 2022-12-29 DIAGNOSIS — R45851 Suicidal ideations: Secondary | ICD-10-CM | POA: Insufficient documentation

## 2022-12-29 MED ORDER — ACETAMINOPHEN 325 MG PO TABS: 650.0000 mg | ORAL_TABLET | Freq: Four times a day (QID) | ORAL | Status: DC | PRN

## 2022-12-29 MED ORDER — ALUM & MAG HYDROXIDE-SIMETH 200-200-20 MG/5ML PO SUSP: 30.0000 mL | ORAL | Status: DC | PRN

## 2022-12-29 MED ORDER — MAGNESIUM HYDROXIDE 400 MG/5ML PO SUSP: 30.0000 mL | Freq: Every day | ORAL | Status: DC | PRN

## 2022-12-29 MED ORDER — SERTRALINE HCL 50 MG PO TABS
50.0000 mg | ORAL_TABLET | Freq: Once | ORAL | Status: AC
  Administered 2022-12-30: 50 mg via ORAL
  Filled 2022-12-29: qty 1

## 2022-12-29 MED ORDER — TRAZODONE HCL 50 MG PO TABS
50.0000 mg | ORAL_TABLET | Freq: Every evening | ORAL | Status: DC | PRN
  Administered 2022-12-30 (×2): 50 mg via ORAL
  Filled 2022-12-29 (×2): qty 1

## 2022-12-29 MED ORDER — HYDROXYZINE HCL 25 MG PO TABS
25.0000 mg | ORAL_TABLET | Freq: Three times a day (TID) | ORAL | Status: DC | PRN
  Administered 2022-12-30: 25 mg via ORAL
  Filled 2022-12-29: qty 1

## 2022-12-29 NOTE — ED Triage Notes (Signed)
Patient presents to [facility] voluntarily. Patient reports having thought of self harm by cutting for thye past two days. Patient does rpeort a diagnosis of Major depressive disorder. Patient reports being released form inpatient a couple months ago. Patient reports he did not follow up with therapy and medication management due to his job. Patient denies HI and AVH at this time. Patient is urgent.

## 2022-12-29 NOTE — Progress Notes (Signed)
   12/29/22 2219  [facility] Triage Screening (Walk-ins at [facility] only)  How Did You Hear About [place]? Self  What Is the Reason for Your Visit/Call Today? Patient presents to [facility] voluntarily. Patient reports having thought of self harm by cutting for thye past two days. Patient does rpeort a diagnosis of Major depressive disorder. Patient reports being released form inpatient a couple months ago. Patient reports he did not follow up with therapy and medication management due to his job. Patient denies HI and AVH at this time. Patient is urgent.  How Long Has This Been Causing You Problems? <Week  Have You Recently Had Any Thoughts About Hurting Yourself? Yes  How long ago did you have thoughts about hurting yourself? Today  Are You Planning to Commit Suicide/Harm Yourself At This time? No  Have you Recently Had Thoughts About Hurting Someone [name]? No  Are You Planning To Harm Someone At This Time? No  Are you currently experiencing any auditory, visual or other hallucinations? No  Have You Used Any Alcohol or Drugs in the Past 24 Hours? Yes  How long ago did you use Drugs or Alcohol? Yesterday  What Did You Use and How Much? Pt reports beers and cocaine 3 or 4 beers and 2 grams or more  Do you have any current medical co-morbidities that require immediate attention? No  Clinician description of patient physical appearance/behavior: Patient is calm and cooperative.  What Do You Feel Would Help You the Most Today? Treatment for Depression or other mood problem;Alcohol or Drug Use Treatment;Medication(s)  If access to [facility] was not available, would you have sought care in the Emergency Department? Yes  Determination of Need Urgent (48 hours)  Options For Referral Outpatient Therapy;Therapeutic Triage [facility]
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
## 2022-12-30 LAB — LIPID PANEL
Cholesterol: 213 mg/dL — ABNORMAL HIGH (ref 0–200)
HDL: 40 mg/dL — ABNORMAL LOW (ref >40)
LDL Cholesterol: 125 mg/dL — ABNORMAL HIGH (ref 0–99)
Total CHOL/HDL Ratio: 5.3 {ratio}
Triglycerides: 238 mg/dL — ABNORMAL HIGH (ref <150)
VLDL: 48 mg/dL — ABNORMAL HIGH (ref 0–40)

## 2022-12-30 LAB — POCT URINE DRUG SCREEN - MANUAL ENTRY (I-SCREEN)
POC Amphetamine UR: NOT DETECTED
POC Buprenorphine (BUP): NOT DETECTED
POC Cocaine UR: POSITIVE — AB
POC Marijuana UR: POSITIVE — AB
POC Methadone UR: NOT DETECTED
POC Methamphetamine UR: NOT DETECTED — AB
POC Morphine: NOT DETECTED
POC Oxazepam (BZO): NOT DETECTED
POC Oxycodone UR: NOT DETECTED
POC Secobarbital (BAR): NOT DETECTED

## 2022-12-30 LAB — COMPREHENSIVE METABOLIC PANEL
ALT: 21 U/L (ref 0–44)
AST: 21 U/L (ref 15–41)
Albumin: 3.9 g/dL (ref 3.5–5.0)
Alkaline Phosphatase: 37 U/L — ABNORMAL LOW (ref 38–126)
Anion gap: 12 (ref 5–15)
BUN: 14 mg/dL (ref 6–20)
CO2: 23 mmol/L (ref 22–32)
Calcium: 9.1 mg/dL (ref 8.9–10.3)
Chloride: 103 mmol/L (ref 98–111)
Creatinine, Ser: 1.12 mg/dL (ref 0.61–1.24)
GFR, Estimated: >60 mL/min (ref >60)
Glucose, Bld: 81 mg/dL (ref 70–99)
Potassium: 3.9 mmol/L (ref 3.5–5.1)
Sodium: 138 mmol/L (ref 135–145)
Total Bilirubin: 0.6 mg/dL (ref 0.3–1.2)
Total Protein: 6.7 g/dL (ref 6.5–8.1)

## 2022-12-30 LAB — CBC WITH DIFFERENTIAL/PLATELET
Abs Immature Granulocytes: 0.02 10*3/uL (ref 0.00–0.07)
Basophils Absolute: 0.1 10*3/uL (ref 0.0–0.1)
Basophils Relative: 1 %
Eosinophils Absolute: 0.4 10*3/uL (ref 0.0–0.5)
Eosinophils Relative: 4 %
HCT: 45.2 % (ref 39.0–52.0)
Hemoglobin: 15.6 g/dL (ref 13.0–17.0)
Immature Granulocytes: 0 %
Lymphocytes Relative: 29 %
Lymphs Abs: 2.5 10*3/uL (ref 0.7–4.0)
MCH: 31.5 pg (ref 26.0–34.0)
MCHC: 34.5 g/dL (ref 30.0–36.0)
MCV: 91.3 fL (ref 80.0–100.0)
Monocytes Absolute: 1 10*3/uL (ref 0.1–1.0)
Monocytes Relative: 12 %
Neutro Abs: 4.6 10*3/uL (ref 1.7–7.7)
Neutrophils Relative %: 54 %
Platelets: 356 10*3/uL (ref 150–400)
RBC: 4.95 MIL/uL (ref 4.22–5.81)
RDW: 13.5 % (ref 11.5–15.5)
WBC: 8.6 10*3/uL (ref 4.0–10.5)
nRBC: 0 % (ref 0.0–0.2)

## 2022-12-30 LAB — URINALYSIS, ROUTINE W REFLEX MICROSCOPIC
Bilirubin Urine: NEGATIVE
Glucose, UA: NEGATIVE mg/dL
Hgb urine dipstick: NEGATIVE
Ketones, ur: NEGATIVE mg/dL
Leukocytes,Ua: NEGATIVE
Nitrite: NEGATIVE
Protein, ur: NEGATIVE mg/dL
Specific Gravity, Urine: 1.017 (ref 1.005–1.030)
pH: 7 (ref 5.0–8.0)

## 2022-12-30 LAB — MAGNESIUM: Magnesium: 2.1 mg/dL (ref 1.7–2.4)

## 2022-12-30 LAB — ETHANOL: Alcohol, Ethyl (B): <10 mg/dL (ref <10)

## 2022-12-30 LAB — RPR: RPR Ser Ql: NONREACTIVE

## 2022-12-30 LAB — TSH: TSH: 0.933 u[IU]/mL (ref 0.350–4.500)

## 2022-12-30 MED ORDER — SERTRALINE HCL 50 MG PO TABS
50.0000 mg | ORAL_TABLET | Freq: Every day | ORAL | Status: DC
  Administered 2022-12-31: 50 mg via ORAL
  Filled 2022-12-30: qty 1

## 2022-12-30 NOTE — ED Provider Notes (Signed)
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 [place] [address]    POC Amphetamine UR 07/30/2022 None Detected  NONE DETECTED (Cut Off Level 1000 ng/mL) Final   POC Secobarbital (BAR) 07/30/2022 None Detected  NONE DETECTED (Cut Off Level 300 ng/mL) Final   POC Buprenorphine (BUP) 07/30/2022 None Detected  NONE DETECTED (Cut Off Level 10 ng/mL) Final   POC Oxazepam (BZO)  07/30/2022 None Detected  NONE DETECTED (Cut Off Level 300 ng/mL) Final   POC Cocaine UR 07/30/2022 Positive (A)  NONE DETECTED (Cut Off Level 300 ng/mL) Final   POC Methamphetamine UR 07/30/2022 None Detected  NONE DETECTED (Cut Off Level 1000 ng/mL) Final   POC Morphine 07/30/2022 None Detected  NONE DETECTED (Cut Off Level 300 ng/mL) Final   POC Methadone UR 07/30/2022 None Detected  NONE DETECTED (Cut Off Level 300 ng/mL) Final   POC Oxycodone UR 07/30/2022 None Detected  NONE DETECTED (Cut Off Level 100 ng/mL) Final   POC Marijuana UR 07/30/2022 Positive (A)  NONE DETECTED (Cut Off Level 50 ng/mL) Final   TSH 07/30/2022 0.973  0.350 - 4.500 uIU/mL Final   Comment: Performed by a 3rd Generation assay with a functional sensitivity of <=0.01 uIU/mL. Performed at [facility], [address] [place], [place] [address]     Blood Alcohol level:  Lab Results  Component Value Date   ETH <10 12/29/2022   ETH 39 (H) 11/17/2022    Metabolic  Disorder Labs: Lab Results  Component Value Date   HGBA1C 5.5 11/20/2022   MPG 111.15 11/20/2022   MPG 116.89 07/30/2022   No results found for: "PROLACTIN" Lab Results  Component Value Date   CHOL 213 (H) 12/29/2022   TRIG 238 (H) 12/29/2022   HDL 40 (L) 12/29/2022   CHOLHDL 5.3 12/29/2022   VLDL 48 (H) 12/29/2022   LDLCALC 125 (H) 12/29/2022   LDLCALC 96 11/20/2022    Therapeutic Lab Levels: No results found for: "LITHIUM" Lab Results  Component Value Date   VALPROATE 61 05/26/2017   VALPROATE 40 (L) 08/31/2015   No results found for: "CBMZ"  Physical Findings   AIMS    Flowsheet Row [facility]) [facility]) [facility] Ad[facility] (Dis[facility]) [facility]  AIMS Total Score 0 0 0      AUDIT    Flowsheet Row Ad[facility]) [facility] Ad[facility] (Discharged) [facility] Ad[facility] (Discharged[facility] Ad[facility] (Discharged) [facility]  Alcohol Use Disorder Identification Test Final Score (AUDIT) 4 3 5 1       PHQ2-9    Flowsheet Row ED from 12/29/2022 in [facility] ED from 07/30/2022 in [facility]  PHQ-2 Total Score 2 2  PHQ-9 Total Score 15 3      Flowsheet Row ED from 12/29/2022 in [facility] ED from 12/20/2022 in [facility] Admission (Discharged) [facility]  C-SSRS RISK CATEGORY High Risk No Risk High Risk        Musculoskeletal  Strength & Muscle Tone: within normal limits Gait & Station: normal Patient leans: N/A  Psychiatric Specialty Exam  Presentation  General Appearance:  Appropriate for Environment; Casual  Eye Contact: Good  Speech: Clear and Coherent; Normal Rate  Speech Volume: Normal  Handedness: Right   Mood and Affect  Mood: Depressed  Affect: Depressed   Thought Process  Thought Processes: Coherent; Goal Directed; Linear  Descriptions of Associations:Intact  Orientation:Full (Time, Place and Person)  Thought Content:Logical; WDL  Diagnosis of Schizophrenia or Schizoaffective disorder in past: No    Hallucinations:Hallucinations: None  Ideas of Reference:None  Suicidal Thoughts:Suicidal Thoughts: No SI Passive Intent and/or Plan: -- (self harm)  Homicidal Thoughts:Homicidal Thoughts: No   Sensorium  Memory: Immediate Good; Recent Good  Judgment: Fair  Insight: Fair   Art therapist  Concentration: Good  Attention  Span: Good  Recall: Good  Fund of Knowledge: Good  Language: Good   Psychomotor Activity  Psychomotor Activity: Psychomotor Activity: Normal   Assets  Assets: Desire for Improvement; Communication Skills; Financial Resources/Insurance; Physical Health; Social Support   Sleep  Sleep: Sleep: Good   Nutritional Assessment (For OBS and [facility] admissions only) Has the patient had a weight loss or gain of 10 pounds or more in the last 3 months?: No Has the patient had a decrease in food intake/or appetite?: No Does the patient have dental problems?: No Does the patient have eating habits or behaviors that may be indicators of an eating disorder including binging or inducing vomiting?: No Has the patient recently lost weight without trying?: 2.0 Has the  patient been eating poorly because of a decreased appetite?: 1 Malnutrition Screening Tool Score: 3 Nutritional Assessment Referrals: Refer to Social Work for [facility]    Physical Exam  Physical Exam Vitals and nursing note reviewed.  Constitutional:      Appearance: Normal appearance. He is well-developed.  HENT:     Head: Normocephalic and atraumatic.     Nose: Nose normal.  Cardiovascular:     Rate and Rhythm: Normal rate.  Pulmonary:     Effort: Pulmonary effort is normal.  Musculoskeletal:        General: Normal range of motion.     Cervical back: Normal range of motion.  Skin:    General: Skin is warm and dry.  Neurological:     Mental Status: He is alert and oriented to person, place, and time.  Psychiatric:        Attention and Perception: Attention and perception normal.        Mood and Affect: Affect normal. Mood is depressed.        Speech: Speech normal.        Behavior: Behavior normal. Behavior is cooperative.        Thought Content: Thought content normal.        Cognition and Memory: Cognition and memory normal.        Judgment: Judgment normal.    Review of Systems   Constitutional: Negative.   HENT: Negative.    Eyes: Negative.   Respiratory: Negative.    Cardiovascular: Negative.   Gastrointestinal: Negative.   Genitourinary: Negative.   Musculoskeletal: Negative.   Skin: Negative.   Neurological: Negative.   Psychiatric/Behavioral:  Positive for depression and substance abuse.    Blood pressure 108/68, pulse (!) 50, temperature 98.3 F (36.8 C), temperature source Oral, resp. rate 18, SpO2 97%. There is no height or weight on file to calculate BMI.  Treatment Plan Summary: Patient reviewed with attending psychiatrist, Dr. Phineas Inches.  Patient will remain at [facility] behavioral health continuous observation.  Facility based crisis unit at capacity today.  He can be considered for facility based crisis admission on tomorrow, 12/31/2022.  Current medications: -Acetaminophen 650 mg every 6 as needed/mild pain -Maalox 30 mL oral every 4 as needed/digestion -Hydroxyzine 25 mg 3 times daily as needed/anxiety -Magnesium hydroxide 30 mL daily as needed/mild constipation -Trazodone 50 mg nightly as needed/sleep -Sertraline 50 mg daily/mood  Daily contact with patient to assess and evaluate symptoms and progress in treatment  Lenard Lance, FNP 12/30/2022 11:14 AM

## 2022-12-30 NOTE — ED Notes (Signed)
Pt A&O x 4, presents with passive SI, no plan noted and urge to perform self harming behaviors.  Pt reports he sought help before cutting self.  Pt calm & cooperative.  Comfort measures given.  Monitoring for safety.

## 2022-12-30 NOTE — ED Notes (Signed)

## 2022-12-30 NOTE — ED Notes (Signed)
Patient resting with eyes closed. Respirations even and unlabored. No acute distress noted. Environment secured. Will continue to monitor for safety.

## 2022-12-30 NOTE — ED Notes (Signed)
Patient observed/assessed in bed/chair resting quietly appearing in no distress and verbalizing no complaints at this time. Will continue to monitor.  

## 2022-12-30 NOTE — ED Provider Notes (Signed)
[facility] Continuous Assessment Admission H&P  Date: 12/30/22 Patient Name: Jonathan [name] MRN: [id number] Chief Complaint: Self harm /suicidal thoughts  Diagnoses:  Final diagnoses:  Alcohol use disorder [F10.90]  Cocaine use disorder (HCC) [F14.10]  Moderate episode of recurrent major depressive disorder (HCC) [F33.1]    HPI: 27 y.o. M with hx of substance abuse, MDD, presenting to the [facility] with thoughts of self harm.  Per chart review, he was admitted to the behavior health unit for self harm  11/17/2022.  He admits to not following up with recommended outpatient resources following discharge because of his work schedule.  Today, on this presentation, he states that he is no longer working and is ready to make his mental health a priority because he is finding it more difficult to function.  He also states that he would like help with his crack cocaine addiction.   He reports feeling the urge to cut himself, but decided to come here for help instead.  He denies SI/HI/AVH.      Total Time spent with patient: 45 minutes  Musculoskeletal  Strength & Muscle Tone: within normal limits Gait & Station: normal Patient leans: N/A  Psychiatric Specialty Exam  Presentation General Appearance:  Casual; Disheveled  Eye Contact: Fair  Speech: Clear and Coherent  Speech Volume: Decreased  Handedness: Right   Mood and Affect  Mood: Anxious; Depressed; Hopeless; Worthless  Affect: Solicitor Processes: Coherent  Descriptions of Associations:Intact  Orientation:Full (Time, Place and Person)  Thought Content:Logical  Diagnosis of Schizophrenia or Schizoaffective disorder in past: No   Hallucinations:Hallucinations: None  Ideas of Reference:None  Suicidal Thoughts:Suicidal Thoughts: Yes, Passive SI Passive Intent and/or Plan: -- (self harm)  Homicidal Thoughts:Homicidal Thoughts: No   Sensorium  Memory: Immediate Fair; Remote  Fair  Judgment: Impaired  Insight: Fair   Chartered certified accountant: Fair  Attention Span: Fair  Recall: [employer] of Knowledge: Fair  Language: Fair   Psychomotor Activity  Psychomotor Activity: Psychomotor Activity: Normal   Assets  Assets: Social Support; Housing; Vocational/Educational   Sleep  Sleep: Sleep: Fair   Nutritional Assessment (For OBS and FBC admissions only) Has the patient had a weight loss or gain of 10 pounds or more in the last 3 months?: No Has the patient had a decrease in food intake/or appetite?: No Does the patient have dental problems?: No Does the patient have eating habits or behaviors that may be indicators of an eating disorder including binging or inducing vomiting?: No Has the patient recently lost weight without trying?: 2.0 Has the patient been eating poorly because of a decreased appetite?: 1 Malnutrition Screening Tool Score: 3 Nutritional Assessment Referrals: Refer to Social Work for [facility]    Physical Exam Vitals and nursing note reviewed.  HENT:     Head: Normocephalic and atraumatic.     Nose: Nose normal.  Eyes:     Pupils: Pupils are equal, round, and reactive to light.  Pulmonary:     Effort: Pulmonary effort is normal.  Musculoskeletal:        General: Normal range of motion.     Cervical back: Normal range of motion.  Skin:    General: Skin is dry.  Neurological:     Mental Status: He is alert and oriented to person, place, and time.  Psychiatric:        Attention and Perception: Attention and perception normal.        Mood and  Affect: Mood is anxious and depressed. Affect is flat.        Speech: Speech normal.        Behavior: Behavior is cooperative.        Thought Content: Thought content normal.        Cognition and Memory: Cognition and memory normal.        Judgment: Judgment is impulsive.    Review of Systems  Psychiatric/Behavioral:  Positive for depression and  substance abuse. Negative for hallucinations. The patient is nervous/anxious and has insomnia.   All other systems reviewed and are negative.   Blood pressure 122/66, pulse 86, temperature 98.1 F (36.7 C), temperature source Oral, resp. rate 18, SpO2 98%. There is no height or weight on file to calculate BMI.  Past Psychiatric History: MDD, Anxiety, substance abuse   Is the patient at risk to self? Yes  Has the patient been a risk to self in the past 6 months? Yes .    Has the patient been a risk to self within the distant past? Yes   Is the patient a risk to others? No   Has the patient been a risk to others in the past 6 months? No   Has the patient been a risk to others within the distant past? No   Past Medical History: Unknown  Family History: unknowm  Social History: Unemployed, homeless  Last Labs:  Admission on 12/29/2022  Component Date Value Ref Range Status   POC Amphetamine UR 12/29/2022 None Detected  NONE DETECTED (Cut Off Level 1000 ng/mL) Final   POC Secobarbital (BAR) 12/29/2022 None Detected  NONE DETECTED (Cut Off Level 300 ng/mL) Final   POC Buprenorphine (BUP) 12/29/2022 None Detected  NONE DETECTED (Cut Off Level 10 ng/mL) Final   POC Oxazepam (BZO) 12/29/2022 None Detected  NONE DETECTED (Cut Off Level 300 ng/mL) Final   POC Cocaine UR 12/29/2022 Positive (A)  NONE DETECTED (Cut Off Level 300 ng/mL) Final   POC Methamphetamine UR 12/29/2022 None Detected (A)  NONE DETECTED (Cut Off Level 1000 ng/mL) Final   POC Morphine 12/29/2022 None Detected  NONE DETECTED (Cut Off Level 300 ng/mL) Final   POC Methadone UR 12/29/2022 None Detected  NONE DETECTED (Cut Off Level 300 ng/mL) Final   POC Oxycodone UR 12/29/2022 None Detected  NONE DETECTED (Cut Off Level 100 ng/mL) Final   POC Marijuana UR 12/29/2022 Positive (A)  NONE DETECTED (Cut Off Level 50 ng/mL) Final  Admission on 12/20/2022, Discharged on 12/20/2022  Component Date Value Ref Range Status   Sodium  12/20/2022 139  135 - 145 mmol/L Final   Potassium 12/20/2022 3.8  3.5 - 5.1 mmol/L Final   Chloride 12/20/2022 106  98 - 111 mmol/L Final   CO2 12/20/2022 26  22 - 32 mmol/L Final   Glucose, Bld 12/20/2022 88  70 - 99 mg/dL Final   Glucose reference range applies only to samples taken after fasting for at least 8 hours.   BUN 12/20/2022 16  6 - 20 mg/dL Final   Creatinine, Ser 12/20/2022 1.15  0.61 - 1.24 mg/dL Final   Calcium 09/81/1914 8.3 (L)  8.9 - 10.3 mg/dL Final   GFR, Estimated 12/20/2022 >60  >60 mL/min Final   Comment: (NOTE) Calculated using the CKD-EPI Creatinine Equation (2021)    Anion gap 12/20/2022 7  5 - 15 Final   Performed at [facility], [address], [place], [place] [address]   WBC 12/20/2022 9.3  4.0 -  10.5 K/uL Final   RBC 12/20/2022 4.56  4.22 - 5.81 MIL/uL Final   Hemoglobin 12/20/2022 14.3  13.0 - 17.0 g/dL Final   HCT 96/29/5284 40.7  39.0 - 52.0 % Final   MCV 12/20/2022 89.3  80.0 - 100.0 fL Final   MCH 12/20/2022 31.4  26.0 - 34.0 pg Final   MCHC 12/20/2022 35.1  30.0 - 36.0 g/dL Final   RDW 13/24/4010 13.5  11.5 - 15.5 % Final   Platelets 12/20/2022 254  150 - 400 K/uL Final   nRBC 12/20/2022 0.0  0.0 - 0.2 % Final   Performed at [facility], [address], [place], [place] [address]   Troponin I (High Sensitivity) 12/20/2022 3  <18 ng/L Final   Comment: (NOTE) Elevated high sensitivity troponin I (hsTnI) values and significant  changes across serial measurements may suggest ACS but many other  chronic and acute conditions are known to elevate hsTnI results.  Refer to the "Links" section for chest pain algorithms and additional  guidance. Performed at [facility], [address] [place], [place] [address]    Opiates 12/20/2022 NONE DETECTED  NONE DETECTED Final   Cocaine 12/20/2022 POSITIVE (A)  NONE DETECTED Final   Benzodiazepines 12/20/2022 NONE DETECTED  NONE DETECTED Final    Amphetamines 12/20/2022 NONE DETECTED  NONE DETECTED Final   Tetrahydrocannabinol 12/20/2022 POSITIVE (A)  NONE DETECTED Final   Barbiturates 12/20/2022 NONE DETECTED  NONE DETECTED Final   Comment: (NOTE) DRUG SCREEN FOR MEDICAL PURPOSES ONLY.  IF CONFIRMATION IS NEEDED FOR ANY PURPOSE, NOTIFY LAB WITHIN 5 DAYS.  LOWEST DETECTABLE LIMITS FOR URINE DRUG SCREEN Drug Class                     Cutoff (ng/mL) Amphetamine and metabolites    1000 Barbiturate and metabolites    200 Benzodiazepine                 200 Opiates and metabolites        300 Cocaine and metabolites        300 THC                            50 Performed at [facility], [address], [place], [place] [address]   Admission on 11/17/2022, Discharged on 11/21/2022  Component Date Value Ref Range Status   Cholesterol 11/20/2022 171  0 - 200 mg/dL Final   Triglycerides 59/56/3875 192 (H)  <150 mg/dL Final   HDL 64/33/2951 37 (L)  >40 mg/dL Final   Total CHOL/HDL Ratio 11/20/2022 4.6  RATIO Final   VLDL 11/20/2022 38  0 - 40 mg/dL Final   LDL Cholesterol 11/20/2022 96  0 - 99 mg/dL Final   Comment:        Total Cholesterol/HDL:CHD Risk Coronary Heart Disease Risk Table                     Men   Women  1/2 Average Risk   3.4   3.3  Average Risk       5.0   4.4  2 X Average Risk   9.6   7.1  3 X Average Risk  23.4   11.0        Use the calculated Patient Ratio above and the CHD Risk Table to determine the patient's CHD Risk.        ATP III CLASSIFICATION (  LDL):  <100     mg/dL   Optimal  562-130  mg/dL   Near or Above                    Optimal  130-159  mg/dL   Borderline  865-784  mg/dL   High  >696     mg/dL   Very High Performed at [facility], [address] [place], [place] [address]    Hgb A1c MFr Bld 11/20/2022 5.5  4.8 - 5.6 % Final   Comment: (NOTE) Pre diabetes:          5.7%-6.4%  Diabetes:              >6.4%  Glycemic control for   <7.0% adults  with diabetes    Mean Plasma Glucose 11/20/2022 111.15  mg/dL Final   Performed at [facility], [address] [place], [place] [address]   TSH 11/20/2022 1.248  0.350 - 4.500 uIU/mL Final   Comment: Performed by a 3rd Generation assay with a functional sensitivity of <=0.01 uIU/mL. Performed at [facility], [address], [place], [place] [address]    Vit D, 25-Hydroxy 11/20/2022 26.84 (L)  30 - 100 ng/mL Final   Comment: (NOTE) Vitamin D deficiency has been defined by the Institute of Medicine  and an Endocrine Society practice guideline as a level of serum 25-OH  vitamin D less than 20 ng/mL (1,2). The Endocrine Society went on to  further define vitamin D insufficiency as a level between 21 and 29  ng/mL (2).  1. IOM (Institute of Medicine). 2010. Dietary reference intakes for  calcium and D. [place]: [employer]. 2. Holick MF, Binkley , Bischoff-Ferrari HA, et al. Evaluation,  treatment, and prevention of vitamin D deficiency: an Endocrine  Society clinical practice guideline, JCEM. 2011 Jul; 96(7): 1911-30.  Performed at [facility], [address], [place], [place] [address]   Admission on 11/17/2022, Discharged on 11/17/2022  Component Date Value Ref Range Status   Sodium 11/17/2022 138  135 - 145 mmol/L Final   Potassium 11/17/2022 3.5  3.5 - 5.1 mmol/L Final   Chloride 11/17/2022 103  98 - 111 mmol/L Final   CO2 11/17/2022 22  22 - 32 mmol/L Final   Glucose, Bld 11/17/2022 85  70 - 99 mg/dL Final   Glucose reference range applies only to samples taken after fasting for at least 8 hours.   BUN 11/17/2022 8  6 - 20 mg/dL Final   Creatinine, Ser 11/17/2022 1.36 (H)  0.61 - 1.24 mg/dL Final   Calcium 64/40/3474 9.0  8.9 - 10.3 mg/dL Final   Total Protein 25/95/6387 6.8  6.5 - 8.1 g/dL Final   Albumin 56/43/3295 4.1  3.5 - 5.0 g/dL Final   AST 18/84/1660 23  15 - 41 U/L Final   ALT 11/17/2022 18  0 - 44 U/L Final    Alkaline Phosphatase 11/17/2022 30 (L)  38 - 126 U/L Final   Total Bilirubin 11/17/2022 0.4  0.3 - 1.2 mg/dL Final   GFR, Estimated 11/17/2022 >60  >60 mL/min Final   Comment: (NOTE) Calculated using the CKD-EPI Creatinine Equation (2021)    Anion gap 11/17/2022 13  5 - 15 Final   Performed at [facility], [address] [place], [place] [address]   Alcohol, Ethyl (B) 11/17/2022 39 (H)  <10 mg/dL Final   Comment: (NOTE) Lowest detectable  limit for serum alcohol is 10 mg/dL.  For medical purposes only. Performed at [facility], [address], [place], [place] [address]    Salicylate Lvl 11/17/2022 <7.0 (L)  7.0 - 30.0 mg/dL Final   Performed at [facility], [address] [place], [place] [address]   Acetaminophen (Tylenol), Serum 11/17/2022 <10 (L)  10 - 30 ug/mL Final   Comment: (NOTE) Therapeutic concentrations vary significantly. A range of 10-30 ug/mL  may be an effective concentration for many patients. However, some  are best treated at concentrations outside of this range. Acetaminophen concentrations >150 ug/mL at 4 hours after ingestion  and >50 ug/mL at 12 hours after ingestion are often associated with  toxic reactions.  Performed at [facility], [address], [place], [place] [address]    WBC 11/17/2022 9.6  4.0 - 10.5 K/uL Final   RBC 11/17/2022 5.20  4.22 - 5.81 MIL/uL Final   Hemoglobin 11/17/2022 15.9  13.0 - 17.0 g/dL Final   HCT 91/47/8295 46.3  39.0 - 52.0 % Final   MCV 11/17/2022 89.0  80.0 - 100.0 fL Final   MCH 11/17/2022 30.6  26.0 - 34.0 pg Final   MCHC 11/17/2022 34.3  30.0 - 36.0 g/dL Final   RDW 62/13/0865 13.3  11.5 - 15.5 % Final   Platelets 11/17/2022 307  150 - 400 K/uL Final   nRBC 11/17/2022 0.0  0.0 - 0.2 % Final   Performed at [facility], [address] [place], [place] [address]   Opiates 11/17/2022 NONE DETECTED  NONE DETECTED Final   Cocaine 11/17/2022 POSITIVE (A)  NONE DETECTED Final    Benzodiazepines 11/17/2022 NONE DETECTED  NONE DETECTED Final   Amphetamines 11/17/2022 NONE DETECTED  NONE DETECTED Final   Tetrahydrocannabinol 11/17/2022 POSITIVE (A)  NONE DETECTED Final   Barbiturates 11/17/2022 NONE DETECTED  NONE DETECTED Final   Comment: (NOTE) DRUG SCREEN FOR MEDICAL PURPOSES ONLY.  IF CONFIRMATION IS NEEDED FOR ANY PURPOSE, NOTIFY LAB WITHIN 5 DAYS.  LOWEST DETECTABLE LIMITS FOR URINE DRUG SCREEN Drug Class                     Cutoff (ng/mL) Amphetamine and metabolites    1000 Barbiturate and metabolites    200 Benzodiazepine                 200 Opiates and metabolites        300 Cocaine and metabolites        300 THC                            50 Performed at [facility], [address], [place], [place] [address]   Admission on 08/16/2022, Discharged on 08/16/2022  Component Date Value Ref Range Status   Sodium 08/16/2022 135  135 - 145 mmol/L Final   Potassium 08/16/2022 3.8  3.5 - 5.1 mmol/L Final   Chloride 08/16/2022 106  98 - 111 mmol/L Final   CO2 08/16/2022 22  22 - 32 mmol/L Final   Glucose, Bld 08/16/2022 140 (H)  70 - 99 mg/dL Final   Glucose reference range applies only to samples taken after fasting for at least 8 hours.   BUN 08/16/2022 13  6 - 20 mg/dL Final   Creatinine, Ser 08/16/2022 1.05  0.61 - 1.24 mg/dL Final   Calcium 84/13/2440 8.4 (L)  8.9 -  10.3 mg/dL Final   GFR, Estimated 08/16/2022 >60  >60 mL/min Final   Comment: (NOTE) Calculated using the CKD-EPI Creatinine Equation (2021)    Anion gap 08/16/2022 7  5 - 15 Final   Performed at [facility], [address], [place], [place] [address]   WBC 08/16/2022 8.7  4.0 - 10.5 K/uL Final   RBC 08/16/2022 5.02  4.22 - 5.81 MIL/uL Final   Hemoglobin 08/16/2022 15.5  13.0 - 17.0 g/dL Final   HCT 60/45/4098 45.2  39.0 - 52.0 % Final   MCV 08/16/2022 90.0  80.0 - 100.0 fL Final   MCH 08/16/2022 30.9  26.0 - 34.0 pg Final   MCHC 08/16/2022 34.3  30.0 -  36.0 g/dL Final   RDW 11/91/4782 13.7  11.5 - 15.5 % Final   Platelets 08/16/2022 271  150 - 400 K/uL Final   nRBC 08/16/2022 0.0  0.0 - 0.2 % Final   Performed at [facility], [address], [place], [place] [address]   Troponin I (High Sensitivity) 08/16/2022 <2  <18 ng/L Final   Comment: (NOTE) Elevated high sensitivity troponin I (hsTnI) values and significant  changes across serial measurements may suggest ACS but many other  chronic and acute conditions are known to elevate hsTnI results.  Refer to the "Links" section for chest pain algorithms and additional  guidance. Performed at [facility], [address], [place], [place] [address]   Admission on 07/30/2022, Discharged on 07/31/2022  Component Date Value Ref Range Status   WBC 07/30/2022 11.1 (H)  4.0 - 10.5 K/uL Final   RBC 07/30/2022 4.60  4.22 - 5.81 MIL/uL Final   Hemoglobin 07/30/2022 14.3  13.0 - 17.0 g/dL Final   HCT 78/46/9629 40.5  39.0 - 52.0 % Final   MCV 07/30/2022 88.0  80.0 - 100.0 fL Final   MCH 07/30/2022 31.1  26.0 - 34.0 pg Final   MCHC 07/30/2022 35.3  30.0 - 36.0 g/dL Final   RDW 52/84/1324 14.0  11.5 - 15.5 % Final   Platelets 07/30/2022 380  150 - 400 K/uL Final   nRBC 07/30/2022 0.0  0.0 - 0.2 % Final   Neutrophils Relative % 07/30/2022 56  % Final   Neutro Abs 07/30/2022 6.3  1.7 - 7.7 K/uL Final   Lymphocytes Relative 07/30/2022 26  % Final   Lymphs Abs 07/30/2022 2.9  0.7 - 4.0 K/uL Final   Monocytes Relative 07/30/2022 10  % Final   Monocytes Absolute 07/30/2022 1.1 (H)  0.1 - 1.0 K/uL Final   Eosinophils Relative 07/30/2022 6  % Final   Eosinophils Absolute 07/30/2022 0.7 (H)  0.0 - 0.5 K/uL Final   Basophils Relative 07/30/2022 1  % Final   Basophils Absolute 07/30/2022 0.1  0.0 - 0.1 K/uL Final   Immature Granulocytes 07/30/2022 1  % Final   Abs Immature Granulocytes 07/30/2022 0.05  0.00 - 0.07 K/uL Final   Performed at [facility],  [address], [place], [place] [address]   Sodium 07/30/2022 138  135 - 145 mmol/L Final   Potassium 07/30/2022 4.0  3.5 - 5.1 mmol/L Final   Chloride 07/30/2022 105  98 - 111 mmol/L Final   CO2 07/30/2022 26  22 - 32 mmol/L Final   Glucose, Bld 07/30/2022 94  70 - 99 mg/dL Final   Glucose reference range applies only to samples taken after fasting for at least 8 hours.   BUN 07/30/2022  13  6 - 20 mg/dL Final   Creatinine, Ser 07/30/2022 1.15  0.61 - 1.24 mg/dL Final   Calcium 27/25/3664 9.3  8.9 - 10.3 mg/dL Final   Total Protein 40/34/7425 6.0 (L)  6.5 - 8.1 g/dL Final   Albumin 95/63/8756 3.5  3.5 - 5.0 g/dL Final   AST 43/32/9518 19  15 - 41 U/L Final   ALT 07/30/2022 15  0 - 44 U/L Final   Alkaline Phosphatase 07/30/2022 44  38 - 126 U/L Final   Total Bilirubin 07/30/2022 0.3  0.3 - 1.2 mg/dL Final   GFR, Estimated 07/30/2022 >60  >60 mL/min Final   Comment: (NOTE) Calculated using the CKD-EPI Creatinine Equation (2021)    Anion gap 07/30/2022 7  5 - 15 Final   Performed at [facility], [address] [place], [place] [address]   Hgb A1c MFr Bld 07/30/2022 5.7 (H)  4.8 - 5.6 % Final   Comment: (NOTE) Pre diabetes:          5.7%-6.4%  Diabetes:              >6.4%  Glycemic control for   <7.0% adults with diabetes    Mean Plasma Glucose 07/30/2022 116.89  mg/dL Final   Performed at [facility], [address], [place], [place] [address]   Alcohol, Ethyl (B) 07/30/2022 <10  <10 mg/dL Final   Comment: (NOTE) Lowest detectable limit for serum alcohol is 10 mg/dL.  For medical purposes only. Performed at [facility], [address] [place], [place] [address]    Cholesterol 07/30/2022 193  0 - 200 mg/dL Final   Triglycerides 32/35/5732 232 (H)  <150 mg/dL Final   HDL 20/25/4270 37 (L)  >40 mg/dL Final   Total CHOL/HDL Ratio 07/30/2022 5.2  RATIO Final   VLDL 07/30/2022 46 (H)  0 - 40 mg/dL Final   LDL Cholesterol 07/30/2022 110 (H)  0 - 99 mg/dL Final    Comment:        Total Cholesterol/HDL:CHD Risk Coronary Heart Disease Risk Table                     Men   Women  1/2 Average Risk   3.4   3.3  Average Risk       5.0   4.4  2 X Average Risk   9.6   7.1  3 X Average Risk  23.4   11.0        Use the calculated Patient Ratio above and the CHD Risk Table to determine the patient's CHD Risk.        ATP III CLASSIFICATION (LDL):  <100     mg/dL   Optimal  623-762  mg/dL   Near or Above                    Optimal  130-159  mg/dL   Borderline  831-517  mg/dL   High  >616     mg/dL   Very High Performed at [facility], [address], [place], [place] [address]    POC Amphetamine UR 07/30/2022 None Detected  NONE DETECTED (Cut Off Level 1000 ng/mL) Final   POC Secobarbital (BAR) 07/30/2022 None Detected  NONE DETECTED (Cut Off Level 300 ng/mL) Final   POC Buprenorphine (BUP) 07/30/2022 None Detected  NONE DETECTED (Cut Off Level 10 ng/mL) Final   POC Oxazepam (BZO) 07/30/2022 None Detected  NONE DETECTED (Cut Off Level 300 ng/mL) Final   POC Cocaine UR 07/30/2022 Positive (A)  NONE DETECTED (Cut Off Level 300 ng/mL) Final   POC Methamphetamine UR 07/30/2022 None Detected  NONE DETECTED (Cut Off Level 1000 ng/mL) Final   POC Morphine 07/30/2022 None Detected  NONE DETECTED (Cut Off Level 300 ng/mL) Final   POC Methadone UR 07/30/2022 None Detected  NONE DETECTED (Cut Off Level 300 ng/mL) Final   POC Oxycodone UR 07/30/2022 None Detected  NONE DETECTED (Cut Off Level 100 ng/mL) Final   POC Marijuana UR 07/30/2022 Positive (A)  NONE DETECTED (Cut Off Level 50 ng/mL) Final   TSH 07/30/2022 0.973  0.350 - 4.500 uIU/mL Final   Comment: Performed by a 3rd Generation assay with a functional sensitivity of <=0.01 uIU/mL. Performed at [facility], [address], [place], [place] [address]     Allergies: Patient has no known allergies.  Medications:  Facility Ordered Medications  Medication   acetaminophen (TYLENOL) tablet  650 mg   alum & mag hydroxide-simeth (MAALOX/MYLANTA) 200-200-20 MG/5ML suspension 30 mL   magnesium hydroxide (MILK OF MAGNESIA) suspension 30 mL   traZODone (DESYREL) tablet 50 mg   hydrOXYzine (ATARAX) tablet 25 mg   [COMPLETED] sertraline (ZOLOFT) tablet 50 mg   PTA Medications  Medication Sig   hydrOXYzine (ATARAX) 25 MG tablet Take 1 tablet (25 mg total) by mouth every 8 (eight) hours as needed for anxiety.   neomycin-bacitracin-polymyxin (NEOSPORIN) OINT Apply 1 Application topically 2 (two) times daily.   nicotine (NICODERM CQ - DOSED IN MG/24 HOURS) 21 mg/24hr patch Place 1 patch (21 mg total) onto the skin daily.   sertraline (ZOLOFT) 100 MG tablet Take 1 tablet (100 mg total) by mouth daily.   traZODone (DESYREL) 50 MG tablet Take 1 tablet (50 mg total) by mouth at bedtime.   naproxen (NAPROSYN) 500 MG tablet Take 1 tablet (500 mg total) by mouth 2 (two) times daily.      Medical Decision Making  Jonathan [name] was admitted to [facility] observation unit  under the service for MDD (major depressive disorder), recurrent severe, without psychosis (HCC), crisis management, and stabilization. Routine labs ordered, which include Lab Orders         CBC with Differential/Platelet         Comprehensive metabolic panel         Magnesium         Ethanol         Lipid panel         TSH         RPR         Urinalysis, Routine w reflex microscopic -Urine, Clean Catch         POCT Urine Drug Screen - (I-Screen)    Medication Management: Medications started Zoloft 50mg  PRN  Trazodone 50mg , hydroxyzine 50, tylenol Will maintain observation checks every 15 minutes for safety. Psychosocial education regarding relapse prevention and self-care; social and communication  Social work will consult with family for collateral information and discuss discharge and follow up plan. Admit to observation unit     Recommendations  Based on my evaluation the patient does not appear to have an  emergency medical condition. Admit to observation unit  Jearld Lesch, NP 12/30/22  1:07 AM

## 2022-12-30 NOTE — ED Notes (Signed)
Patient is lying in bed quietly, with no acute complaints of distress. Patient is aware that he wil be going to [facility] tomorrow for detox.

## 2022-12-30 NOTE — ED Notes (Signed)
Patient A&Ox4. Patient denies any physical complaints when asked. No acute distress noted. Support and encouragement provided. Routine safety checks conducted according to facility protocol. Encouraged patient to notify staff if thoughts of harm toward self or others arise. Endorses safety. Patient verbalized understanding and agreement. Will continue to monitor for safety.

## 2022-12-31 ENCOUNTER — Encounter (HOSPITAL_COMMUNITY): Payer: Self-pay | Admitting: Registered Nurse

## 2022-12-31 ENCOUNTER — Other Ambulatory Visit (HOSPITAL_COMMUNITY)
Admission: EM | Admit: 2022-12-31 | Discharge: 2023-01-04 | Disposition: A | Discharge status: Home / Self Care | Payer: MEDICAID | Attending: Psychiatry | Admitting: Psychiatry

## 2022-12-31 ENCOUNTER — Other Ambulatory Visit (HOSPITAL_BASED_OUTPATIENT_CLINIC_OR_DEPARTMENT_OTHER): Payer: Self-pay

## 2022-12-31 DIAGNOSIS — F319 Bipolar disorder, unspecified: Secondary | ICD-10-CM | POA: Diagnosis not present

## 2022-12-31 DIAGNOSIS — F1090 Alcohol use, unspecified, uncomplicated: Secondary | ICD-10-CM

## 2022-12-31 DIAGNOSIS — F1994 Other psychoactive substance use, unspecified with psychoactive substance-induced mood disorder: Secondary | ICD-10-CM | POA: Diagnosis present

## 2022-12-31 DIAGNOSIS — F17200 Nicotine dependence, unspecified, uncomplicated: Secondary | ICD-10-CM

## 2022-12-31 DIAGNOSIS — F1290 Cannabis use, unspecified, uncomplicated: Secondary | ICD-10-CM

## 2022-12-31 DIAGNOSIS — F1910 Other psychoactive substance abuse, uncomplicated: Secondary | ICD-10-CM | POA: Diagnosis present

## 2022-12-31 DIAGNOSIS — F1590 Other stimulant use, unspecified, uncomplicated: Secondary | ICD-10-CM

## 2022-12-31 DIAGNOSIS — F1414 Cocaine abuse with cocaine-induced mood disorder: Secondary | ICD-10-CM | POA: Diagnosis not present

## 2022-12-31 MED ORDER — HYDROXYZINE HCL 25 MG PO TABS
25.0000 mg | ORAL_TABLET | Freq: Three times a day (TID) | ORAL | Status: DC | PRN
  Filled 2022-12-31: qty 10

## 2022-12-31 MED ORDER — ACETAMINOPHEN 325 MG PO TABS
650.0000 mg | ORAL_TABLET | Freq: Four times a day (QID) | ORAL | Status: DC | PRN
  Administered 2023-01-01 – 2023-01-03 (×2): 650 mg via ORAL
  Filled 2022-12-31 (×2): qty 2

## 2022-12-31 MED ORDER — ALUM & MAG HYDROXIDE-SIMETH 200-200-20 MG/5ML PO SUSP: 30.0000 mL | ORAL | Status: DC | PRN

## 2022-12-31 MED ORDER — TRAZODONE HCL 50 MG PO TABS
50.0000 mg | ORAL_TABLET | Freq: Every evening | ORAL | Status: DC | PRN
  Administered 2023-01-01 – 2023-01-03 (×3): 50 mg via ORAL
  Filled 2022-12-31 (×2): qty 1
  Filled 2022-12-31: qty 14
  Filled 2022-12-31 (×2): qty 1

## 2022-12-31 MED ORDER — MAGNESIUM HYDROXIDE 400 MG/5ML PO SUSP: 30.0000 mL | Freq: Every day | ORAL | Status: DC | PRN

## 2022-12-31 MED ORDER — SERTRALINE HCL 50 MG PO TABS
50.0000 mg | ORAL_TABLET | Freq: Every day | ORAL | Status: DC
  Administered 2023-01-01 – 2023-01-02 (×2): 50 mg via ORAL
  Filled 2022-12-31 (×2): qty 1

## 2022-12-31 NOTE — ED Notes (Signed)
Pt observed/assessed in recliner sleeping. RR even and unlabored, appearing in no noted distress. Environmental check complete, will continue to monitor for safety 

## 2022-12-31 NOTE — ED Notes (Signed)
Patient alert and oriented x 3. Denies SI/HI/AVH. Denies intent or plan to harm self or others. Routine conducted according to faculty protocol. Encourage patient to notify staff with any needs or concerns. Patient verbalized agreement and understanding. Will continue to monitor for safety. 

## 2022-12-31 NOTE — ED Notes (Signed)
Rn called report to [facility] rn. States to bring  patient over.

## 2022-12-31 NOTE — ED Notes (Signed)
Pt is currently sleeping, no distress noted, environmental check complete, will continue to monitor patient for safety.  

## 2022-12-31 NOTE — ED Notes (Signed)
Patient consumed dinner and returned to bed. Denies SI, HI, AVH. No s/s of response to internal stimuli.

## 2022-12-31 NOTE — ED Notes (Signed)
Pt is in the bed resting. Respirations are even and unlabored. No acute distress noted. Will continue to monitor for safety

## 2022-12-31 NOTE — ED Notes (Signed)
Patient excused from group r/t observed tiredness.

## 2022-12-31 NOTE — Discharge Instructions (Signed)
Transfer to [facility] unit for continuation of care

## 2022-12-31 NOTE — ED Notes (Signed)
Patient  sleeping in no acute stress. RR even and unlabored .Environment secured .Will continue to monitor for safely. 

## 2022-12-31 NOTE — ED Provider Notes (Signed)
FBC/OBS ASAP Discharge Summary  Date and Time: 12/31/2022 12:46 PM  Name: Jonathan [name]  MRN:  [id number]   Discharge Diagnoses:  Final diagnoses:  Alcohol use disorder [F10.90]  Cocaine use disorder (HCC) [F14.10]  Moderate episode of recurrent major depressive disorder (HCC) [F33.1]  Polysubstance abuse (HCC)  Substance induced mood disorder [facility])    Stay Summary: Jonathan [name] 27 y/o male patient with a history of polysubstance abuse, major depression was admitted to the continuous assessment unit after presenting unaccompanied to [facility] with complaints of depression, self-harm,  and substance abuse.    Patient reassessed face-to-face by this provider, chart reviewed, and consulted with Dr. Nelly Rout on 12/31/22 On evaluation, Jonathan [name] is lying in bed with no noted distress.  He is alert, oriented x 4, and cooperative.  His speech is clear, coherent, at normal pace and moderate volume.  His mood is dysphoric with congruent affect.  He denies suicidal/self-harm/homicidal ideation, psychosis, and paranoia.  He/She reports he needs help with substance abuse.  Reports he is no longer having self-harming thoughts.   Objectively there is no evidence of psychosis/mania or delusional thinking.  His responses to assessment questions were relevant and appropriate.  He conversed coherently, with goal directed thoughts, no distractibility, or pre-occupation.  Recommended for admission to [facility]   Total Time spent with patient: 30 minutes  Past Psychiatric History: Polysubstance abuse, MDD Past Medical History:  Past Medical History:  Diagnosis Date   Bipolar 1 disorder (HCC)     Family History: History reviewed. No pertinent family history.   None reported Family Psychiatric History: Unaware Social History:  Homeless Social History   Tobacco Use   Smoking status: Every Day    Current packs/day: 1.00    Types: Cigarettes   Smokeless tobacco: Never  Vaping Use   Vaping status:  Never Used  Substance Use Topics   Alcohol use: Yes    Alcohol/week: 2.0 standard drinks of alcohol    Types: 2 Cans of beer per week    Comment: Occ   Drug use: Yes    Types: Marijuana, Cocaine    Tobacco Cessation:  A prescription for an FDA-approved tobacco cessation medication was offered at discharge and the patient refused  Current Medications:  Current Facility-Administered Medications  Medication Dose Route Frequency Provider Last Rate Last Admin   acetaminophen (TYLENOL) tablet 650 mg  650 mg Oral Q6H PRN Jearld Lesch, NP       alum & mag hydroxide-simeth (MAALOX/MYLANTA) 200-200-20 MG/5ML suspension 30 mL  30 mL Oral Q4H PRN Durwin Nora, Rashaun M, NP       hydrOXYzine (ATARAX) tablet 25 mg  25 mg Oral TID PRN Jearld Lesch, NP   25 mg at 12/30/22 0014   magnesium hydroxide (MILK OF MAGNESIA) suspension 30 mL  30 mL Oral Daily PRN Jearld Lesch, NP       sertraline (ZOLOFT) tablet 50 mg  50 mg Oral Daily Lenard Lance, FNP   50 mg at 12/31/22 0910   traZODone (DESYREL) tablet 50 mg  50 mg Oral QHS PRN Jearld Lesch, NP   50 mg at 12/30/22 2105   Current Outpatient Medications  Medication Sig Dispense Refill   hydrOXYzine (ATARAX) 25 MG tablet Take 1 tablet (25 mg total) by mouth every 8 (eight) hours as needed for anxiety. (Patient not taking: Reported on 12/30/2022) 30 tablet 0   naproxen (NAPROSYN) 500 MG tablet Take 1 tablet (500 mg total) by mouth  2 (two) times daily. (Patient not taking: Reported on 12/30/2022) 30 tablet 0   neomycin-bacitracin-polymyxin (NEOSPORIN) OINT Apply 1 Application topically 2 (two) times daily. (Patient not taking: Reported on 12/30/2022)     nicotine (NICODERM CQ - DOSED IN MG/24 HOURS) 21 mg/24hr patch Place 1 patch (21 mg total) onto the skin daily. (Patient not taking: Reported on 12/30/2022) 28 patch 0   sertraline (ZOLOFT) 100 MG tablet Take 1 tablet (100 mg total) by mouth daily. (Patient not taking: Reported on 12/30/2022) 30 tablet 0    traZODone (DESYREL) 50 MG tablet Take 1 tablet (50 mg total) by mouth at bedtime. (Patient not taking: Reported on 12/30/2022) 30 tablet 0    PTA Medications:  Facility Ordered Medications  Medication   acetaminophen (TYLENOL) tablet 650 mg   alum & mag hydroxide-simeth (MAALOX/MYLANTA) 200-200-20 MG/5ML suspension 30 mL   magnesium hydroxide (MILK OF MAGNESIA) suspension 30 mL   traZODone (DESYREL) tablet 50 mg   hydrOXYzine (ATARAX) tablet 25 mg   [COMPLETED] sertraline (ZOLOFT) tablet 50 mg   sertraline (ZOLOFT) tablet 50 mg   PTA Medications  Medication Sig   hydrOXYzine (ATARAX) 25 MG tablet Take 1 tablet (25 mg total) by mouth every 8 (eight) hours as needed for anxiety. (Patient not taking: Reported on 12/30/2022)   neomycin-bacitracin-polymyxin (NEOSPORIN) OINT Apply 1 Application topically 2 (two) times daily. (Patient not taking: Reported on 12/30/2022)   nicotine (NICODERM CQ - DOSED IN MG/24 HOURS) 21 mg/24hr patch Place 1 patch (21 mg total) onto the skin daily. (Patient not taking: Reported on 12/30/2022)   sertraline (ZOLOFT) 100 MG tablet Take 1 tablet (100 mg total) by mouth daily. (Patient not taking: Reported on 12/30/2022)   traZODone (DESYREL) 50 MG tablet Take 1 tablet (50 mg total) by mouth at bedtime. (Patient not taking: Reported on 12/30/2022)   naproxen (NAPROSYN) 500 MG tablet Take 1 tablet (500 mg total) by mouth 2 (two) times daily. (Patient not taking: Reported on 12/30/2022)       12/30/2022   12:40 AM 12/30/2022   12:29 AM 07/31/2022   12:39 PM  Depression screen PHQ 2/9  Decreased Interest 1 1 1   Down, Depressed, Hopeless 1 2 1   PHQ - 2 Score 2 3 2   Altered sleeping 2 1 0  Tired, decreased energy 2 2 1   Change in appetite 2 2 0  Feeling bad or failure about yourself  2 2 0  Trouble concentrating 2  0  Moving slowly or fidgety/restless 1 1 0  Suicidal thoughts 2 1 0  PHQ-9 Score 15 12 3   Difficult doing work/chores Very difficult  Somewhat difficult     Flowsheet Row ED from 12/29/2022 in [facility] ED from 12/20/2022 in [facility] Emergency Department at [facility]Discharged[facility]  C-SSRS RISK CATEGORY High Risk No Risk High Risk       Musculoskeletal  Strength & Muscle Tone: within normal limits Gait & Station: normal Patient leans: N/A  Psychiatric Specialty Exam  Presentation  General Appearance:  Appropriate for Environment  Eye Contact: Good  Speech: Clear and Coherent; Normal Rate  Speech Volume: Normal  Handedness: Right   Mood and Affect  Mood: Depressed  Affect: Congruent; Depressed   Thought Process  Thought Processes: Coherent; Goal Directed  Descriptions of Associations:Intact  Orientation:Full (Time, Place and Person)  Thought Content:Logical  Diagnosis of Schizophrenia or Schizoaffective disorder in past: No  Hallucinations:Hallucinations: None  Ideas of Reference:None  Suicidal Thoughts:Suicidal Thoughts: No  Homicidal Thoughts:Homicidal Thoughts: No   Sensorium  Memory: Immediate Good; Recent Good  Judgment: Intact  Insight: Present   Executive Functions  Concentration: Good  Attention Span: Good  Recall: Good  Fund of Knowledge: Good  Language: Good   Psychomotor Activity  Psychomotor Activity: Psychomotor Activity: Normal   Assets  Assets: Communication Skills; Desire for Improvement; Physical Health   Sleep  Sleep: Sleep: Good   Nutritional Assessment (For OBS and FBC admissions only) Has the patient had a weight loss or gain of 10 pounds or more in the last 3 months?: No Has the patient had a decrease in food intake/or appetite?: No Does the patient have dental problems?: No Does the patient have eating habits or behaviors that may be indicators of an eating disorder including binging or inducing vomiting?: No Has the patient  recently lost weight without trying?: 0 Has the patient been eating poorly because of a decreased appetite?: 0 Malnutrition Screening Tool Score: 0    Physical Exam  Physical Exam Vitals and nursing note reviewed.  Constitutional:      General: He is not in acute distress.    Appearance: Normal appearance. He is not ill-appearing.  HENT:     Head: Normocephalic.  Eyes:     Conjunctiva/sclera: Conjunctivae normal.  Cardiovascular:     Rate and Rhythm: Normal rate.  Pulmonary:     Effort: Pulmonary effort is normal.  Musculoskeletal:        General: Normal range of motion.     Cervical back: Normal range of motion.  Skin:    General: Skin is warm and dry.  Neurological:     Mental Status: He is alert and oriented to person, place, and time.  Psychiatric:        Attention and Perception: Attention and perception normal. He does not perceive auditory or visual hallucinations.        Mood and Affect: Depressed: dysphoric.        Speech: Speech normal.        Behavior: Behavior normal. Behavior is cooperative.        Thought Content: Thought content normal. Thought content is not paranoid or delusional. Thought content does not include homicidal or suicidal ideation.        Cognition and Memory: Cognition normal.        Judgment: Judgment is impulsive.    Review of Systems  Constitutional:        No other complaints voiced  Psychiatric/Behavioral:  Positive for substance abuse. Depression: Stable. Hallucinations: Denies. Suicidal ideas: Denies.Nervous/anxious: Stable.   All other systems reviewed and are negative.  Blood pressure 106/65, pulse (!) 51, temperature 98.1 F (36.7 C), temperature source Oral, resp. rate 16, SpO2 99%. There is no height or weight on file to calculate BMI.   Disposition: Transferred to [facility] for continuation of care  Brinda Focht, NP 12/31/2022, 12:46 PM

## 2022-12-31 NOTE — ED Notes (Signed)
Patient in milieu. Environment is secured. Will continue to monitor for safety. 

## 2022-12-31 NOTE — ED Notes (Signed)
Patient transferring to fbc

## 2022-12-31 NOTE — Group Note (Signed)
Group Topic: Wellness  Group Date: 12/31/2022 Start Time: 2015 End Time: 2045 Facilitators: Guss Bunde  Department: [facility]  Number of Participants: 5  Group Focus: check in Treatment Modality:  Psychoeducation Interventions utilized were support Purpose: reinforce self-care  Name: Rilee Wendling Date of Birth: [birth year]  MR: [id number]    Level of Participation: active Quality of Participation: attentive Interactions with others: gave feedback Mood/Affect: appropriate Triggers (if applicable):  Cognition: goal directed Progress: Gaining insight Response: Pt wants to be around positive people and go to a 30 day rehab Plan: patient will be encouraged to follow up with goals  Patients Problems:  Patient Active Problem List   Diagnosis Date Noted   Substance induced mood disorder (HCC) 12/31/2022   Polysubstance abuse (HCC) 12/31/2022   Mild alcohol use disorder 11/18/2022   Insomnia 11/18/2022   MDD (major depressive disorder), recurrent episode (HCC) 07/30/2022   Alcohol use disorder 01/20/2022   Delta-9-tetrahydrocannabinol (THC) dependence (HCC) 01/20/2022   Cocaine use disorder (HCC) 01/20/2022   Pneumonia 05/23/2017   MDD (major depressive disorder), recurrent severe, without psychosis (HCC) 05/22/2017

## 2022-12-31 NOTE — ED Notes (Signed)
Patient is sleeping. Respirations equal and unlabored, skin warm and dry, NAD. No change in assessment or acuity. Routine safety checks conducted according to facility protocol. Will continue to monitor for safety.   

## 2023-01-01 DIAGNOSIS — F1914 Other psychoactive substance abuse with psychoactive substance-induced mood disorder: Secondary | ICD-10-CM | POA: Insufficient documentation

## 2023-01-01 DIAGNOSIS — F1410 Cocaine abuse, uncomplicated: Secondary | ICD-10-CM | POA: Insufficient documentation

## 2023-01-01 DIAGNOSIS — F411 Generalized anxiety disorder: Secondary | ICD-10-CM | POA: Insufficient documentation

## 2023-01-01 DIAGNOSIS — F1210 Cannabis abuse, uncomplicated: Secondary | ICD-10-CM | POA: Diagnosis not present

## 2023-01-01 DIAGNOSIS — F1010 Alcohol abuse, uncomplicated: Secondary | ICD-10-CM | POA: Insufficient documentation

## 2023-01-01 DIAGNOSIS — T43226A Underdosing of selective serotonin reuptake inhibitors, initial encounter: Secondary | ICD-10-CM | POA: Insufficient documentation

## 2023-01-01 DIAGNOSIS — Z91128 Patient's intentional underdosing of medication regimen for other reason: Secondary | ICD-10-CM | POA: Insufficient documentation

## 2023-01-01 DIAGNOSIS — F17200 Nicotine dependence, unspecified, uncomplicated: Secondary | ICD-10-CM | POA: Insufficient documentation

## 2023-01-01 DIAGNOSIS — F332 Major depressive disorder, recurrent severe without psychotic features: Secondary | ICD-10-CM | POA: Insufficient documentation

## 2023-01-01 MED ORDER — NICOTINE 21 MG/24HR TD PT24
21.0000 mg | MEDICATED_PATCH | Freq: Once | TRANSDERMAL | Status: AC
  Administered 2023-01-01: 21 mg via TRANSDERMAL
  Filled 2023-01-01: qty 1

## 2023-01-01 MED ORDER — NICOTINE 21 MG/24HR TD PT24
21.0000 mg | MEDICATED_PATCH | Freq: Every day | TRANSDERMAL | Status: DC | PRN
  Filled 2023-01-01: qty 14

## 2023-01-01 NOTE — ED Notes (Signed)
Pt reported a headache at a 7/10 and requested PRN tylenol. Medication given, no further complaints at this time.

## 2023-01-01 NOTE — ED Notes (Signed)
Pt sitting in dayroom interacting with peers. No acute distress noted. No concerns voiced. Informed pt to notify staff with any needs or assistance. Pt verbalized understanding or agreement. Will continue to monitor for safety. 

## 2023-01-01 NOTE — BH Assessment (Signed)
Patient alert & oriented x4. Denies intent to harm self or others when asked. Denies A/VH. Patient denies any physical complaints when asked. No acute distress noted. Patient makes brief eye contact and responds to questions asked in short remarks, pleasant demeanor. Support and encouragement provided. Routine safety checks conducted per facility protocol. Encouraged patient to notify staff if any thoughts of harm towards self or others arise. Patient verbalizes understanding and agreement.  Miqueas Whilden, RN 10:05 AM 10/012024

## 2023-01-01 NOTE — ED Notes (Signed)
Pt is in the dayroom watching TV with peers. Pt denies SI/HI/AVH. No acute distress noted. Will continue to monitor for safety. 

## 2023-01-01 NOTE — Group Note (Signed)
Group Topic: Positive Affirmations  Group Date: 01/01/2023 Start Time: 1130 End Time: 1202 Facilitators: Vonzell Schlatter B  Department: [facility]  Number of Participants: 6  Group Focus: affirmation and daily focus Treatment Modality:  Psychoeducation Interventions utilized were problem solving Purpose: regain self-worth and relapse prevention strategies  Name: Jonathan [name] Date of Birth: 07/23/1995  MR: [id number]    Level of Participation: active Quality of Participation: attentive and cooperative Interactions with others: gave feedback Mood/Affect: positive Triggers (if applicable): n/a Cognition: coherent/clear Progress: Moderate Response: n/a Plan: follow-up needed patient is waiting for placement and can't wait to go to a program that will help him...  Patients Problems:  Patient Active Problem List   Diagnosis Date Noted   Substance induced mood disorder (HCC) 12/31/2022   Polysubstance abuse (HCC) 12/31/2022   Mild alcohol use disorder 11/18/2022   Insomnia 11/18/2022   MDD (major depressive disorder), recurrent episode (HCC) 07/30/2022   Alcohol use disorder 01/20/2022   Delta-9-tetrahydrocannabinol (THC) dependence (HCC) 01/20/2022   Cocaine use disorder (HCC) 01/20/2022   Pneumonia 05/23/2017   MDD (major depressive disorder), recurrent severe, without psychosis (HCC) 05/22/2017

## 2023-01-01 NOTE — ED Notes (Signed)
Patient is sleeping. Respirations equal and unlabored, skin warm and dry, NAD. No change in assessment or acuity. Routine safety checks conducted according to facility protocol. Will continue to monitor for safety.   

## 2023-01-01 NOTE — ED Notes (Signed)
Patient is sleeping. Respirations equal and unlabored, skin warm and dry. No change in assessment or acuity. Routine safety checks conducted according to facility protocol. Will continue to monitor for safety.   

## 2023-01-01 NOTE — Progress Notes (Signed)
Patient resting with eyes closed in no apparent acute distress. Respirations even and unlabored. Environment secured. Safety checks in place according to facility policy.

## 2023-01-01 NOTE — Group Note (Signed)
Group Topic: Healthy Self Image and Positive Change  Group Date: 01/01/2023 Start Time: 2000 End Time: 2030 Facilitators: Guss Bunde  Department: [facility]  Number of Participants: 5  Group Focus: affirmation Treatment Modality:  Psychoeducation Interventions utilized were group exercise Purpose: reinforce self-care  Name: Jonathan [name] Date of Birth: Sep 16, 1995  MR: [id number]    Level of Participation: active Quality of Participation: attentive Interactions with others: gave feedback Mood/Affect: appropriate Triggers (if applicable):  Cognition: goal directed Progress: Gaining insight Response: Pt wants to be kinder to people and himself Plan: patient will be encouraged to follow up on goals  Patients Problems:  Patient Active Problem List   Diagnosis Date Noted   Substance induced mood disorder (HCC) 12/31/2022   Polysubstance abuse (HCC) 12/31/2022   Mild alcohol use disorder 11/18/2022   Insomnia 11/18/2022   MDD (major depressive disorder), recurrent episode (HCC) 07/30/2022   Alcohol use disorder 01/20/2022   Delta-9-tetrahydrocannabinol (THC) dependence (HCC) 01/20/2022   Cocaine use disorder (HCC) 01/20/2022   Pneumonia 05/23/2017   MDD (major depressive disorder), recurrent severe, without psychosis (HCC) 05/22/2017

## 2023-01-01 NOTE — ED Provider Notes (Signed)
Facility Based Crisis Admission H&P  Date: 01/01/23 Patient Name: Jonathan [name] MRN: [id number] Chief Complaint: "here for detox"  Diagnoses:  Final diagnoses:  Substance induced mood disorder (HCC)  Alcohol use disorder  Stimulant use disorder  Tobacco use disorder  Cannabis use disorder    HPI:   Jonathan [name] is a 27 yo male with a PPHx significant for MDD, GAD, polysubstance use (cocaine, cannabis, alcohol, tobacco), medication noncompliance, with prior psychiatric hospitalizations (last [facility] 8/17-2/22), who initially presented to [facility] voluntarily on 12/29/2022 and admitted to [facility] on 12/31/2022 requesting detox and medication management. He has multiple psychosocial stressors. His UDS is + cocaine and marijuana on admission.   Patient reports he had longstanding history substance use including daily tobacco, alcohol, cannabis, and cocaine. He has been homeless since earlier this year (he reports between April-May of this year) after losing his section 8 housing. Patient explains he had lost his job following a hospitalization (does not specify) earlier this year, that left him without funds to pay for his housing. He was caring for his 90 yo son at that time, which he shares with an ex-partner. His son's mother took him and moved to [place]. Since being unhoused patient reports he has had little to no access to his son, does not even know where his son lives. He believes these stressors, primarily losing custody of his son, has led to increased substance use.  He also reports that around this time his son was born 5 years ago his mother passed away.  He has also lost both his maternal and paternal grandmother in the past 3 years.  He reports no significant social support in [place] despite having multiple relatives in the area.  Patient reports he is currently staying at the [facility], and will sometimes stay with his girlfriend of 4 years but this is limited as girlfriend currently lives with her  parents.  Patient reports girlfriend is his main source of social support at this time.  On assessment today, patient denies any specific cravings or withdrawals related to his recent substance use.  He rates his depression a 7/10, 10 being the worst.  He notes that he has been experiencing significant depression since stopping his substance use.  He denies any suicidal ideations, or homicidal ideations.  Patient is interested in residential rehabilitation, discussed looking into placement at [facility], which he agrees to.  Substance Abuse detailed below: Alcohol: Drinks 2-3 times a week, will have up to 216 ounce beers at most.  Has been drinking since discharge in August. Tobacco: 1 PPD for several years Cannabis: Reports smoking occasionally Cocaine: Intranasal cocaine use for the past 2 years, reports spending unspecified amounts of money on a weekly basis. Periods of sobriety: Denies any. At most 1 week.  Other Illicit drugs: Denies fentanyl, methamphetamines IV drug use: Denies  Past Psychiatric Hx: Patient's previous psychiatric diagnoses include: MDD recurrent severe, GAD, cocaine use disorder, alcohol use disorder, cannabis use disorder.  Patient was previously hospitalized at [facility] on 11/17/2022-11/22/2022 he was started on Zoloft at that time.  Patient admits to medication noncompliance, stopped taking Zoloft 1.5 weeks after discharge from [facility]. -Patient has 3 prior psychiatric hospitalizations at [facility] Past psychiatric medication: Reports taking Celexa few years back  Social History: Living Situation: unstable housing, lost his apartment. Has been unhoused sometime between June-April. Reports family is not supportive, has several relatives in the area.  Sometimes stays with gf. Stays at [employer] his job after being hospitalized, lost his  section 8 housing.   Occupational hx: Currently unemployed, was previously working at [employer] tables Marital Status:  single Children: son 36 yo, currently with  mother in [place] Legal: Denies any prior incarcerations.  Has upcoming court date on Nov 15th for child support Access to firearms: Denies   PHQ 2-9:  Flowsheet Row ED from 12/29/2022 in [facility] ED from 07/30/2022 in [facility]  Thoughts that you would be better off dead, or of hurting yourself in some way More than half the days Not at all  PHQ-9 Total Score 15 3       Flowsheet Row ED from 12/31/2022 in [facility] ED from 12/29/2022 in [facility] ED from 12/20/2022 in [facility] Emergency Department at [facility]  C-SSRS RISK CATEGORY No Risk High Risk No Risk         Total Time spent with patient: 45 minutes  Musculoskeletal  Strength & Muscle Tone: within normal limits Gait & Station: normal Patient leans: N/A  Psychiatric Specialty Exam  Presentation General Appearance:  Appropriate for Environment; Fairly Groomed  Eye Contact: Fair  Speech: Clear and Coherent  Speech Volume: Normal  Handedness: -- (not asssessed)   Mood and Affect  Mood: Depressed  Affect: Depressed; Constricted   Thought Process  Thought Processes: Linear; Coherent  Descriptions of Associations:Intact  Orientation:-- (not assessed)  Thought Content:Logical  Diagnosis of Schizophrenia or Schizoaffective disorder in past: No   Hallucinations:Hallucinations: None  Ideas of Reference:None  Suicidal Thoughts:Suicidal Thoughts: No  Homicidal Thoughts:Homicidal Thoughts: No   Sensorium  Memory: -- (grossly intact)  Judgment: Fair  Insight: Fair   Art therapist  Concentration: Fair  Attention Span: Fair  Recall: Fair  Fund of Knowledge: Fair  Language: Fair   Psychomotor Activity  Psychomotor Activity: Psychomotor Activity: Normal   Assets  Assets: Manufacturing systems engineer;  Desire for Improvement   Sleep  Sleep: Sleep: Fair   Nutritional Assessment (For OBS and FBC admissions only) Has the patient had a weight loss or gain of 10 pounds or more in the last 3 months?: No Has the patient had a decrease in food intake/or appetite?: No Does the patient have dental problems?: No Does the patient have eating habits or behaviors that may be indicators of an eating disorder including binging or inducing vomiting?: No Has the patient recently lost weight without trying?: 0 Has the patient been eating poorly because of a decreased appetite?: 0 Malnutrition Screening Tool Score: 0    Physical Exam Vitals and nursing note reviewed.  Constitutional:      General: He is not in acute distress.    Appearance: He is not ill-appearing.  HENT:     Head: Normocephalic and atraumatic.  Pulmonary:     Effort: Pulmonary effort is normal. No respiratory distress.  Skin:    General: Skin is warm and dry.    Review of Systems  All other systems reviewed and are negative.   Blood pressure 117/85, pulse 64, temperature 98.2 F (36.8 C), temperature source Oral, resp. rate 18, SpO2 99%. There is no height or weight on file to calculate BMI.   Last Labs:  Admission on 12/29/2022, Discharged on 12/31/2022  Component Date Value Ref Range Status   WBC 12/29/2022 8.6  4.0 - 10.5 K/uL Final   RBC 12/29/2022 4.95  4.22 - 5.81 MIL/uL Final   Hemoglobin 12/29/2022 15.6  13.0 - 17.0 g/dL  Final   HCT 12/29/2022 45.2  39.0 - 52.0 % Final   MCV 12/29/2022 91.3  80.0 - 100.0 fL Final   MCH 12/29/2022 31.5  26.0 - 34.0 pg Final   MCHC 12/29/2022 34.5  30.0 - 36.0 g/dL Final   RDW 13/10/6576 13.5  11.5 - 15.5 % Final   Platelets 12/29/2022 356  150 - 400 K/uL Final   nRBC 12/29/2022 0.0  0.0 - 0.2 % Final   Neutrophils Relative % 12/29/2022 54  % Final   Neutro Abs 12/29/2022 4.6  1.7 - 7.7 K/uL Final   Lymphocytes Relative 12/29/2022 29  % Final   Lymphs Abs 12/29/2022 2.5   0.7 - 4.0 K/uL Final   Monocytes Relative 12/29/2022 12  % Final   Monocytes Absolute 12/29/2022 1.0  0.1 - 1.0 K/uL Final   Eosinophils Relative 12/29/2022 4  % Final   Eosinophils Absolute 12/29/2022 0.4  0.0 - 0.5 K/uL Final   Basophils Relative 12/29/2022 1  % Final   Basophils Absolute 12/29/2022 0.1  0.0 - 0.1 K/uL Final   Immature Granulocytes 12/29/2022 0  % Final   Abs Immature Granulocytes 12/29/2022 0.02  0.00 - 0.07 K/uL Final   Performed at [facility], [address], [place], [place] [address]   Sodium 12/29/2022 138  135 - 145 mmol/L Final   Potassium 12/29/2022 3.9  3.5 - 5.1 mmol/L Final   Chloride 12/29/2022 103  98 - 111 mmol/L Final   CO2 12/29/2022 23  22 - 32 mmol/L Final   Glucose, Bld 12/29/2022 81  70 - 99 mg/dL Final   Glucose reference range applies only to samples taken after fasting for at least 8 hours.   BUN 12/29/2022 14  6 - 20 mg/dL Final   Creatinine, Ser 12/29/2022 1.12  0.61 - 1.24 mg/dL Final   Calcium 95/28/4132 9.1  8.9 - 10.3 mg/dL Final   Total Protein 44/04/270 6.7  6.5 - 8.1 g/dL Final   Albumin 53/66/4403 3.9  3.5 - 5.0 g/dL Final   AST 47/42/5956 21  15 - 41 U/L Final   ALT 12/29/2022 21  0 - 44 U/L Final   Alkaline Phosphatase 12/29/2022 37 (L)  38 - 126 U/L Final   Total Bilirubin 12/29/2022 0.6  0.3 - 1.2 mg/dL Final   GFR, Estimated 12/29/2022 >60  >60 mL/min Final   Comment: (NOTE) Calculated using the CKD-EPI Creatinine Equation (2021)    Anion gap 12/29/2022 12  5 - 15 Final   Performed at [facility], [address], [place], [place] [address]   Magnesium 12/29/2022 2.1  1.7 - 2.4 mg/dL Final   Performed at [facility], [address] [place], [place] [address]   Alcohol, Ethyl (B) 12/29/2022 <10  <10 mg/dL Final   Comment: (NOTE) Lowest detectable limit for serum alcohol is 10 mg/dL.  For medical purposes only. Performed at [facility], [address] [place], [place] [address]     Cholesterol 12/29/2022 213 (H)  0 - 200 mg/dL Final   Triglycerides 16/60/6301 238 (H)  <150 mg/dL Final   HDL 60/12/9321 40 (L)  >40 mg/dL Final   Total CHOL/HDL Ratio 12/29/2022 5.3  RATIO Final   VLDL 12/29/2022 48 (H)  0 - 40 mg/dL Final   LDL Cholesterol 12/29/2022 125 (H)  0 - 99 mg/dL Final   Comment:        Total Cholesterol/HDL:CHD Risk Coronary Heart Disease  Risk Table                     Men   Women  1/2 Average Risk   3.4   3.3  Average Risk       5.0   4.4  2 X Average Risk   9.6   7.1  3 X Average Risk  23.4   11.0        Use the calculated Patient Ratio above and the CHD Risk Table to determine the patient's CHD Risk.        ATP III CLASSIFICATION (LDL):  <100     mg/dL   Optimal  696-295  mg/dL   Near or Above                    Optimal  130-159  mg/dL   Borderline  284-132  mg/dL   High  >440     mg/dL   Very High Performed at [facility], [address], [place], [place] [address]    TSH 12/29/2022 0.933  0.350 - 4.500 uIU/mL Final   Comment: Performed by a 3rd Generation assay with a functional sensitivity of <=0.01 uIU/mL. Performed at [facility], [address], [place], [place] [address]    RPR Ser Ql 12/29/2022 NON REACTIVE  NON REACTIVE Final   Performed at [facility], [address], [place], [place] [address]   Color, Urine 12/30/2022 YELLOW  YELLOW Final   APPearance 12/30/2022 CLEAR  CLEAR Final   Specific Gravity, Urine 12/30/2022 1.017  1.005 - 1.030 Final   pH 12/30/2022 7.0  5.0 - 8.0 Final   Glucose, UA 12/30/2022 NEGATIVE  NEGATIVE mg/dL Final   Hgb urine dipstick 12/30/2022 NEGATIVE  NEGATIVE Final   Bilirubin Urine 12/30/2022 NEGATIVE  NEGATIVE Final   Ketones, ur 12/30/2022 NEGATIVE  NEGATIVE mg/dL Final   Protein, ur 42/59/5638 NEGATIVE  NEGATIVE mg/dL Final   Nitrite 75/64/3329 NEGATIVE  NEGATIVE Final   Leukocytes,Ua 12/30/2022 NEGATIVE  NEGATIVE Final   Performed at [facility], [address],  [place], [place] [address]   POC Amphetamine UR 12/29/2022 None Detected  NONE DETECTED (Cut Off Level 1000 ng/mL) Final   POC Secobarbital (BAR) 12/29/2022 None Detected  NONE DETECTED (Cut Off Level 300 ng/mL) Final   POC Buprenorphine (BUP) 12/29/2022 None Detected  NONE DETECTED (Cut Off Level 10 ng/mL) Final   POC Oxazepam (BZO) 12/29/2022 None Detected  NONE DETECTED (Cut Off Level 300 ng/mL) Final   POC Cocaine UR 12/29/2022 Positive (A)  NONE DETECTED (Cut Off Level 300 ng/mL) Final   POC Methamphetamine UR 12/29/2022 None Detected (A)  NONE DETECTED (Cut Off Level 1000 ng/mL) Final   POC Morphine 12/29/2022 None Detected  NONE DETECTED (Cut Off Level 300 ng/mL) Final   POC Methadone UR 12/29/2022 None Detected  NONE DETECTED (Cut Off Level 300 ng/mL) Final   POC Oxycodone UR 12/29/2022 None Detected  NONE DETECTED (Cut Off Level 100 ng/mL) Final   POC Marijuana UR 12/29/2022 Positive (A)  NONE DETECTED (Cut Off Level 50 ng/mL) Final  Admission on 12/20/2022, Discharged on 12/20/2022  Component Date Value Ref Range Status   Sodium 12/20/2022 139  135 - 145 mmol/L Final   Potassium 12/20/2022 3.8  3.5 - 5.1 mmol/L Final   Chloride 12/20/2022 106  98 - 111 mmol/L Final   CO2 12/20/2022 26  22 - 32 mmol/L Final  Glucose, Bld 12/20/2022 88  70 - 99 mg/dL Final   Glucose reference range applies only to samples taken after fasting for at least 8 hours.   BUN 12/20/2022 16  6 - 20 mg/dL Final   Creatinine, Ser 12/20/2022 1.15  0.61 - 1.24 mg/dL Final   Calcium 29/56/2130 8.3 (L)  8.9 - 10.3 mg/dL Final   GFR, Estimated 12/20/2022 >60  >60 mL/min Final   Comment: (NOTE) Calculated using the CKD-EPI Creatinine Equation (2021)    Anion gap 12/20/2022 7  5 - 15 Final   Performed at [facility], [address] [place], [place] [address]   WBC 12/20/2022 9.3  4.0 - 10.5 K/uL Final   RBC 12/20/2022 4.56  4.22 - 5.81 MIL/uL Final   Hemoglobin 12/20/2022 14.3  13.0 - 17.0 g/dL  Final   HCT 46/96/2952 40.7  39.0 - 52.0 % Final   MCV 12/20/2022 89.3  80.0 - 100.0 fL Final   MCH 12/20/2022 31.4  26.0 - 34.0 pg Final   MCHC 12/20/2022 35.1  30.0 - 36.0 g/dL Final   RDW 84/13/2440 13.5  11.5 - 15.5 % Final   Platelets 12/20/2022 254  150 - 400 K/uL Final   nRBC 12/20/2022 0.0  0.0 - 0.2 % Final   Performed at [facility], [address], [place], [place] [address]   Troponin I (High Sensitivity) 12/20/2022 3  <18 ng/L Final   Comment: (NOTE) Elevated high sensitivity troponin I (hsTnI) values and significant  changes across serial measurements may suggest ACS but many other  chronic and acute conditions are known to elevate hsTnI results.  Refer to the "Links" section for chest pain algorithms and additional  guidance. Performed at [facility], [address] [place], [place] [address]    Opiates 12/20/2022 NONE DETECTED  NONE DETECTED Final   Cocaine 12/20/2022 POSITIVE (A)  NONE DETECTED Final   Benzodiazepines 12/20/2022 NONE DETECTED  NONE DETECTED Final   Amphetamines 12/20/2022 NONE DETECTED  NONE DETECTED Final   Tetrahydrocannabinol 12/20/2022 POSITIVE (A)  NONE DETECTED Final   Barbiturates 12/20/2022 NONE DETECTED  NONE DETECTED Final   Comment: (NOTE) DRUG SCREEN FOR MEDICAL PURPOSES ONLY.  IF CONFIRMATION IS NEEDED FOR ANY PURPOSE, NOTIFY LAB WITHIN 5 DAYS.  LOWEST DETECTABLE LIMITS FOR URINE DRUG SCREEN Drug Class                     Cutoff (ng/mL) Amphetamine and metabolites    1000 Barbiturate and metabolites    200 Benzodiazepine                 200 Opiates and metabolites        300 Cocaine and metabolites        300 THC                            50 Performed at [facility], [address], [place], [place] [address]   Admission on 11/17/2022, Discharged on 11/21/2022  Component Date Value Ref Range Status   Cholesterol 11/20/2022 171  0 - 200 mg/dL Final    Triglycerides 11/20/2022 192 (H)  <150 mg/dL Final   HDL 42/59/5638 37 (L)  >40 mg/dL Final   Total CHOL/HDL Ratio 11/20/2022 4.6  RATIO Final   VLDL 11/20/2022 38  0 - 40 mg/dL Final   LDL Cholesterol 11/20/2022 96  0 - 99 mg/dL Final  Comment:        Total Cholesterol/HDL:CHD Risk Coronary Heart Disease Risk Table                     Men   Women  1/2 Average Risk   3.4   3.3  Average Risk       5.0   4.4  2 X Average Risk   9.6   7.1  3 X Average Risk  23.4   11.0        Use the calculated Patient Ratio above and the CHD Risk Table to determine the patient's CHD Risk.        ATP III CLASSIFICATION (LDL):  <100     mg/dL   Optimal  742-595  mg/dL   Near or Above                    Optimal  130-159  mg/dL   Borderline  638-756  mg/dL   High  >433     mg/dL   Very High Performed at [facility], [address], [place], [place] [address]    Hgb A1c MFr Bld 11/20/2022 5.5  4.8 - 5.6 % Final   Comment: (NOTE) Pre diabetes:          5.7%-6.4%  Diabetes:              >6.4%  Glycemic control for   <7.0% adults with diabetes    Mean Plasma Glucose 11/20/2022 111.15  mg/dL Final   Performed at [facility], [address], [place], [place] [address]   TSH 11/20/2022 1.248  0.350 - 4.500 uIU/mL Final   Comment: Performed by a 3rd Generation assay with a functional sensitivity of <=0.01 uIU/mL. Performed at [facility], [address] [place], [place] [address]    Vit D, 25-Hydroxy 11/20/2022 26.84 (L)  30 - 100 ng/mL Final   Comment: (NOTE) Vitamin D deficiency has been defined by the Institute of Medicine  and an Endocrine Society practice guideline as a level of serum 25-OH  vitamin D less than 20 ng/mL (1,2). The Endocrine Society went on to  further define vitamin D insufficiency as a level between 21 and 29  ng/mL (2).  1. IOM (Institute of Medicine). 2010. Dietary reference intakes for  calcium and D. [place]: The  [employer]. 2. Holick MF, Binkley Carey, Bischoff-Ferrari HA, et al. Evaluation,  treatment, and prevention of vitamin D deficiency: an Endocrine  Society clinical practice guideline, JCEM. 2011 Jul; 96(7): 1911-30.  Performed at [facility], [address], [place], [place] [address]   Admission on 11/17/2022, Discharged on 11/17/2022  Component Date Value Ref Range Status   Sodium 11/17/2022 138  135 - 145 mmol/L Final   Potassium 11/17/2022 3.5  3.5 - 5.1 mmol/L Final   Chloride 11/17/2022 103  98 - 111 mmol/L Final   CO2 11/17/2022 22  22 - 32 mmol/L Final   Glucose, Bld 11/17/2022 85  70 - 99 mg/dL Final   Glucose reference range applies only to samples taken after fasting for at least 8 hours.   BUN 11/17/2022 8  6 - 20 mg/dL Final   Creatinine, Ser 11/17/2022 1.36 (H)  0.61 - 1.24 mg/dL Final   Calcium 32/35/5732 9.0  8.9 - 10.3 mg/dL Final   Total Protein 20/25/4270 6.8  6.5 - 8.1 g/dL Final   Albumin 62/37/6283 4.1  3.5 -  5.0 g/dL Final   AST 11/91/4782 23  15 - 41 U/L Final   ALT 11/17/2022 18  0 - 44 U/L Final   Alkaline Phosphatase 11/17/2022 30 (L)  38 - 126 U/L Final   Total Bilirubin 11/17/2022 0.4  0.3 - 1.2 mg/dL Final   GFR, Estimated 11/17/2022 >60  >60 mL/min Final   Comment: (NOTE) Calculated using the CKD-EPI Creatinine Equation (2021)    Anion gap 11/17/2022 13  5 - 15 Final   Performed at [facility], [address], [place], [place] [address]   Alcohol, Ethyl (B) 11/17/2022 39 (H)  <10 mg/dL Final   Comment: (NOTE) Lowest detectable limit for serum alcohol is 10 mg/dL.  For medical purposes only. Performed at [facility], [address] [place], [place] [address]    Salicylate Lvl 11/17/2022 <7.0 (L)  7.0 - 30.0 mg/dL Final   Performed at [facility], [address] [place], [place] [address]   Acetaminophen (Tylenol), Serum 11/17/2022 <10 (L)  10 - 30 ug/mL Final   Comment: (NOTE) Therapeutic concentrations  vary significantly. A range of 10-30 ug/mL  may be an effective concentration for many patients. However, some  are best treated at concentrations outside of this range. Acetaminophen concentrations >150 ug/mL at 4 hours after ingestion  and >50 ug/mL at 12 hours after ingestion are often associated with  toxic reactions.  Performed at [facility], [address] [place], [place] [address]    WBC 11/17/2022 9.6  4.0 - 10.5 K/uL Final   RBC 11/17/2022 5.20  4.22 - 5.81 MIL/uL Final   Hemoglobin 11/17/2022 15.9  13.0 - 17.0 g/dL Final   HCT 84/13/2440 46.3  39.0 - 52.0 % Final   MCV 11/17/2022 89.0  80.0 - 100.0 fL Final   MCH 11/17/2022 30.6  26.0 - 34.0 pg Final   MCHC 11/17/2022 34.3  30.0 - 36.0 g/dL Final   RDW 01/27/2535 13.3  11.5 - 15.5 % Final   Platelets 11/17/2022 307  150 - 400 K/uL Final   nRBC 11/17/2022 0.0  0.0 - 0.2 % Final   Performed at [facility], [address], [place], [place] [address]   Opiates 11/17/2022 NONE DETECTED  NONE DETECTED Final   Cocaine 11/17/2022 POSITIVE (A)  NONE DETECTED Final   Benzodiazepines 11/17/2022 NONE DETECTED  NONE DETECTED Final   Amphetamines 11/17/2022 NONE DETECTED  NONE DETECTED Final   Tetrahydrocannabinol 11/17/2022 POSITIVE (A)  NONE DETECTED Final   Barbiturates 11/17/2022 NONE DETECTED  NONE DETECTED Final   Comment: (NOTE) DRUG SCREEN FOR MEDICAL PURPOSES ONLY.  IF CONFIRMATION IS NEEDED FOR ANY PURPOSE, NOTIFY LAB WITHIN 5 DAYS.  LOWEST DETECTABLE LIMITS FOR URINE DRUG SCREEN Drug Class                     Cutoff (ng/mL) Amphetamine and metabolites    1000 Barbiturate and metabolites    200 Benzodiazepine                 200 Opiates and metabolites        300 Cocaine and metabolites        300 THC                            50 Performed at [facility], [address] [place], [place] [address]   Admission on 08/16/2022,  Discharged on 08/16/2022  Component Date Value Ref Range Status    Sodium 08/16/2022 135  135 - 145 mmol/L Final   Potassium 08/16/2022 3.8  3.5 - 5.1 mmol/L Final   Chloride 08/16/2022 106  98 - 111 mmol/L Final   CO2 08/16/2022 22  22 - 32 mmol/L Final   Glucose, Bld 08/16/2022 140 (H)  70 - 99 mg/dL Final   Glucose reference range applies only to samples taken after fasting for at least 8 hours.   BUN 08/16/2022 13  6 - 20 mg/dL Final   Creatinine, Ser 08/16/2022 1.05  0.61 - 1.24 mg/dL Final   Calcium 16/01/9603 8.4 (L)  8.9 - 10.3 mg/dL Final   GFR, Estimated 08/16/2022 >60  >60 mL/min Final   Comment: (NOTE) Calculated using the CKD-EPI Creatinine Equation (2021)    Anion gap 08/16/2022 7  5 - 15 Final   Performed at [facility], [address], [place], [place] [address]   WBC 08/16/2022 8.7  4.0 - 10.5 K/uL Final   RBC 08/16/2022 5.02  4.22 - 5.81 MIL/uL Final   Hemoglobin 08/16/2022 15.5  13.0 - 17.0 g/dL Final   HCT 11/91/4782 45.2  39.0 - 52.0 % Final   MCV 08/16/2022 90.0  80.0 - 100.0 fL Final   MCH 08/16/2022 30.9  26.0 - 34.0 pg Final   MCHC 08/16/2022 34.3  30.0 - 36.0 g/dL Final   RDW 95/62/1308 13.7  11.5 - 15.5 % Final   Platelets 08/16/2022 271  150 - 400 K/uL Final   nRBC 08/16/2022 0.0  0.0 - 0.2 % Final   Performed at [facility], [address], [place], [place] [address]   Troponin I (High Sensitivity) 08/16/2022 <2  <18 ng/L Final   Comment: (NOTE) Elevated high sensitivity troponin I (hsTnI) values and significant  changes across serial measurements may suggest ACS but many other  chronic and acute conditions are known to elevate hsTnI results.  Refer to the "Links" section for chest pain algorithms and additional  guidance. Performed at [facility], [address] [place], [place] [address]   Admission on 07/30/2022, Discharged on 07/31/2022  Component Date Value Ref Range Status   WBC 07/30/2022 11.1 (H)  4.0 - 10.5 K/uL Final   RBC 07/30/2022 4.60  4.22  - 5.81 MIL/uL Final   Hemoglobin 07/30/2022 14.3  13.0 - 17.0 g/dL Final   HCT 52/84/1324 40.5  39.0 - 52.0 % Final   MCV 07/30/2022 88.0  80.0 - 100.0 fL Final   MCH 07/30/2022 31.1  26.0 - 34.0 pg Final   MCHC 07/30/2022 35.3  30.0 - 36.0 g/dL Final   RDW 40/01/2724 14.0  11.5 - 15.5 % Final   Platelets 07/30/2022 380  150 - 400 K/uL Final   nRBC 07/30/2022 0.0  0.0 - 0.2 % Final   Neutrophils Relative % 07/30/2022 56  % Final   Neutro Abs 07/30/2022 6.3  1.7 - 7.7 K/uL Final   Lymphocytes Relative 07/30/2022 26  % Final   Lymphs Abs 07/30/2022 2.9  0.7 - 4.0 K/uL Final   Monocytes Relative 07/30/2022 10  % Final   Monocytes Absolute 07/30/2022 1.1 (H)  0.1 - 1.0 K/uL Final   Eosinophils Relative 07/30/2022 6  % Final   Eosinophils Absolute 07/30/2022 0.7 (H)  0.0 - 0.5 K/uL Final   Basophils Relative 07/30/2022 1  % Final   Basophils Absolute 07/30/2022 0.1  0.0 - 0.1  K/uL Final   Immature Granulocytes 07/30/2022 1  % Final   Abs Immature Granulocytes 07/30/2022 0.05  0.00 - 0.07 K/uL Final   Performed at [facility], [address], [place], [place] [address]   Sodium 07/30/2022 138  135 - 145 mmol/L Final   Potassium 07/30/2022 4.0  3.5 - 5.1 mmol/L Final   Chloride 07/30/2022 105  98 - 111 mmol/L Final   CO2 07/30/2022 26  22 - 32 mmol/L Final   Glucose, Bld 07/30/2022 94  70 - 99 mg/dL Final   Glucose reference range applies only to samples taken after fasting for at least 8 hours.   BUN 07/30/2022 13  6 - 20 mg/dL Final   Creatinine, Ser 07/30/2022 1.15  0.61 - 1.24 mg/dL Final   Calcium 65/78/4696 9.3  8.9 - 10.3 mg/dL Final   Total Protein 29/52/8413 6.0 (L)  6.5 - 8.1 g/dL Final   Albumin 24/40/1027 3.5  3.5 - 5.0 g/dL Final   AST 25/36/6440 19  15 - 41 U/L Final   ALT 07/30/2022 15  0 - 44 U/L Final   Alkaline Phosphatase 07/30/2022 44  38 - 126 U/L Final   Total Bilirubin 07/30/2022 0.3  0.3 - 1.2 mg/dL Final   GFR, Estimated 07/30/2022 >60  >60 mL/min Final    Comment: (NOTE) Calculated using the CKD-EPI Creatinine Equation (2021)    Anion gap 07/30/2022 7  5 - 15 Final   Performed at [facility], [address] [place], [place] [address]   Hgb A1c MFr Bld 07/30/2022 5.7 (H)  4.8 - 5.6 % Final   Comment: (NOTE) Pre diabetes:          5.7%-6.4%  Diabetes:              >6.4%  Glycemic control for   <7.0% adults with diabetes    Mean Plasma Glucose 07/30/2022 116.89  mg/dL Final   Performed at [facility], [address], [place], [place] [address]   Alcohol, Ethyl (B) 07/30/2022 <10  <10 mg/dL Final   Comment: (NOTE) Lowest detectable limit for serum alcohol is 10 mg/dL.  For medical purposes only. Performed at [facility], [address] [place], [place] [address]    Cholesterol 07/30/2022 193  0 - 200 mg/dL Final   Triglycerides 33/29/5188 232 (H)  <150 mg/dL Final   HDL 41/66/0630 37 (L)  >40 mg/dL Final   Total CHOL/HDL Ratio 07/30/2022 5.2  RATIO Final   VLDL 07/30/2022 46 (H)  0 - 40 mg/dL Final   LDL Cholesterol 07/30/2022 110 (H)  0 - 99 mg/dL Final   Comment:        Total Cholesterol/HDL:CHD Risk Coronary Heart Disease Risk Table                     Men   Women  1/2 Average Risk   3.4   3.3  Average Risk       5.0   4.4  2 X Average Risk   9.6   7.1  3 X Average Risk  23.4   11.0        Use the calculated Patient Ratio above and the CHD Risk Table to determine the patient's CHD Risk.        ATP III CLASSIFICATION (LDL):  <100     mg/dL   Optimal  160-109  mg/dL   Near or Above  Optimal  130-159  mg/dL   Borderline  454-098  mg/dL   High  >119     mg/dL   Very High Performed at [facility], [address], [place], [place] [address]    POC Amphetamine UR 07/30/2022 None Detected  NONE DETECTED (Cut Off Level 1000 ng/mL) Final   POC Secobarbital (BAR) 07/30/2022 None Detected  NONE DETECTED (Cut Off Level 300 ng/mL) Final   POC Buprenorphine (BUP) 07/30/2022 None  Detected  NONE DETECTED (Cut Off Level 10 ng/mL) Final   POC Oxazepam (BZO) 07/30/2022 None Detected  NONE DETECTED (Cut Off Level 300 ng/mL) Final   POC Cocaine UR 07/30/2022 Positive (A)  NONE DETECTED (Cut Off Level 300 ng/mL) Final   POC Methamphetamine UR 07/30/2022 None Detected  NONE DETECTED (Cut Off Level 1000 ng/mL) Final   POC Morphine 07/30/2022 None Detected  NONE DETECTED (Cut Off Level 300 ng/mL) Final   POC Methadone UR 07/30/2022 None Detected  NONE DETECTED (Cut Off Level 300 ng/mL) Final   POC Oxycodone UR 07/30/2022 None Detected  NONE DETECTED (Cut Off Level 100 ng/mL) Final   POC Marijuana UR 07/30/2022 Positive (A)  NONE DETECTED (Cut Off Level 50 ng/mL) Final   TSH 07/30/2022 0.973  0.350 - 4.500 uIU/mL Final   Comment: Performed by a 3rd Generation assay with a functional sensitivity of <=0.01 uIU/mL. Performed at [facility], [address] [place], [place] [address]     Allergies: Patient has no known allergies.  Medications:  Facility Ordered Medications  Medication   traZODone (DESYREL) tablet 50 mg   sertraline (ZOLOFT) tablet 50 mg   magnesium hydroxide (MILK OF MAGNESIA) suspension 30 mL   hydrOXYzine (ATARAX) tablet 25 mg   alum & mag hydroxide-simeth (MAALOX/MYLANTA) 200-200-20 MG/5ML suspension 30 mL   acetaminophen (TYLENOL) tablet 650 mg   nicotine (NICODERM CQ - dosed in mg/24 hours) patch 21 mg   [START ON 01/02/2023] nicotine (NICODERM CQ - dosed in mg/24 hours) patch 21 mg   PTA Medications  Medication Sig   hydrOXYzine (ATARAX) 25 MG tablet Take 1 tablet (25 mg total) by mouth every 8 (eight) hours as needed for anxiety. (Patient not taking: Reported on 12/30/2022)   neomycin-bacitracin-polymyxin (NEOSPORIN) OINT Apply 1 Application topically 2 (two) times daily. (Patient not taking: Reported on 12/30/2022)   nicotine (NICODERM CQ - DOSED IN MG/24 HOURS) 21 mg/24hr patch Place 1 patch (21 mg total) onto the skin daily. (Patient not  taking: Reported on 12/30/2022)   sertraline (ZOLOFT) 100 MG tablet Take 1 tablet (100 mg total) by mouth daily. (Patient not taking: Reported on 12/30/2022)   traZODone (DESYREL) 50 MG tablet Take 1 tablet (50 mg total) by mouth at bedtime. (Patient not taking: Reported on 12/30/2022)   naproxen (NAPROSYN) 500 MG tablet Take 1 tablet (500 mg total) by mouth 2 (two) times daily. (Patient not taking: Reported on 12/30/2022)    Long Term Goals: Improvement in symptoms so as ready for discharge  Short Term Goals: Patient will verbalize feelings in meetings with treatment team members. and Patient will attend at least of 50% of the groups daily.  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) Tobacco use disorder Cannabis use disorder  Alcohol use disorder   Alcohol use disorder -VSS - EtOH negative, at approx >48 hours since last drink -Continue to monitor  Cannabis use disorder  Cocaine use disorder -Continue to monitor  MDD  vs substance-induced mood disorder  GAD -Continue Zoloft 50 mg daily  Tobacco use disorder -Encourage smoking cessation -Start nicotine patch 21 mg  Other PRNs:  Trazodone 50 mg nightly as needed Tylenol 650 mg every 6 hours as needed Maalox Mylanta every 4 hours as needed Atarax 25 mg 3 times daily as needed Milk of magnesia suspension daily as needed   Labs/imaging reviewed: CMP showing ALT 37 unremarkable;CBC unremarkable; magnesium WNL; ethanol <10; lipid panel (not fasting) showing cholesterol 213, TGs 238, HDL 40, VLDL 48, LDL 125; TSH WNL; RPR nonreactive; UDS positive for cocaine and THC; UA unremarkable EKG on 12/30/2022: QTc 395   Recommendations  Based on my evaluation the patient does not appear to have an emergency medical condition.  Recommend for [facility] residential rehabilitation at this time.  Lorri Frederick, MD 01/01/23  1:09 PM

## 2023-01-01 NOTE — Group Note (Signed)
Group Topic: Communication  Group Date: 01/01/2023 Start Time: 1530 End Time: 1620 Facilitators: Ahniya Mitchum, Jacklynn Barnacle, RN; Prentice Docker, RN  Department: [facility]  Number of Participants: 7  Group Focus: nursing group Treatment Modality:  Patient-Centered Therapy Interventions utilized were confrontation, group exercise, forgiveness, story telling, and support Purpose: express feelings, increase insight, and regain self-worth  Name: Jonathan [name] Date of Birth: Nov 27, 1995  MR: [id number]    Level of Participation: active Quality of Participation: attentive Interactions with others: gave feedback Mood/Affect: appropriate and positive Triggers (if applicable): n/a Cognition: coherent/clear, goal directed, insightful, and logical Progress: Significant Response: "I have to free myself from this demon" Plan: patient will be encouraged to remain focused and continue recovery process.  Patients Problems:  Patient Active Problem List   Diagnosis Date Noted   Substance induced mood disorder (HCC) 12/31/2022   Polysubstance abuse (HCC) 12/31/2022   Mild alcohol use disorder 11/18/2022   Insomnia 11/18/2022   MDD (major depressive disorder), recurrent episode (HCC) 07/30/2022   Alcohol use disorder 01/20/2022   Delta-9-tetrahydrocannabinol (THC) dependence (HCC) 01/20/2022   Cocaine use disorder (HCC) 01/20/2022   Pneumonia 05/23/2017   MDD (major depressive disorder), recurrent severe, without psychosis (HCC) 05/22/2017

## 2023-01-02 DIAGNOSIS — F1210 Cannabis abuse, uncomplicated: Secondary | ICD-10-CM | POA: Diagnosis not present

## 2023-01-02 DIAGNOSIS — F1914 Other psychoactive substance abuse with psychoactive substance-induced mood disorder: Secondary | ICD-10-CM | POA: Diagnosis not present

## 2023-01-02 DIAGNOSIS — F1410 Cocaine abuse, uncomplicated: Secondary | ICD-10-CM | POA: Diagnosis not present

## 2023-01-02 DIAGNOSIS — F1010 Alcohol abuse, uncomplicated: Secondary | ICD-10-CM | POA: Diagnosis not present

## 2023-01-02 MED ORDER — SERTRALINE HCL 50 MG PO TABS
50.0000 mg | ORAL_TABLET | Freq: Every day | ORAL | Status: DC
  Administered 2023-01-03: 50 mg via ORAL
  Filled 2023-01-02: qty 1
  Filled 2023-01-02: qty 14

## 2023-01-02 NOTE — ED Notes (Signed)
Patient sitting in dayroom interacting with peers. No acute distress noted. No concerns voiced. Informed patient to notify staff with any needs or assistance. Patient verbalized understanding or agreement. Safety checks in place per facility policy.

## 2023-01-02 NOTE — Group Note (Signed)
Group Topic: Spirituality in Recovery  Group Date: 01/02/2023 Start Time: 0300 End Time: 0345 Facilitators: Woody Seller, [place]  Department: [facility]  Number of Participants: 5  Group Focus: check in, clarity of thought, daily focus, feeling awareness/expression, forgiveness, goals/reality orientation, healthy friendships, impulsivity, personal responsibility, and self-awareness Treatment Modality:  Behavior Modification Therapy, Cognitive Behavioral Therapy, and Spiritual Interventions utilized were group exercise, story telling, and support Purpose: explore maladaptive thinking, express feelings, improve communication skills, increase insight, regain self-worth, and reinforce self-care  Name: Jonathan [name] Date of Birth: 1995-08-12  MR: [id number]    Level of Participation: active Quality of Participation: attentive, cooperative, engaged, and motivated Interactions with others: gave feedback Mood/Affect: positive Triggers (if applicable): n/a Cognition: coherent/clear, goal directed, insightful, and logical Progress: Gaining insight Summary of Patient Progress: Patient actively participated in group on today. 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  Patient reports that the video clip definitely made him reflect on some areas he can improve on.  Patient reports feeling encouraged from watching the video, and his was able to provide feedback to staff and peers.  Plan: referral / recommendations  Patients Problems:  Patient Active Problem List   Diagnosis Date Noted   Substance induced mood disorder (HCC) 12/31/2022   Polysubstance abuse (HCC) 12/31/2022   Mild alcohol use disorder 11/18/2022   Insomnia 11/18/2022   MDD (major depressive disorder), recurrent episode (HCC) 07/30/2022   Alcohol use disorder 01/20/2022   Delta-9-tetrahydrocannabinol (THC) dependence (HCC)  01/20/2022   Cocaine use disorder (HCC) 01/20/2022   Pneumonia 05/23/2017   MDD (major depressive disorder), recurrent severe, without psychosis (HCC) 05/22/2017

## 2023-01-02 NOTE — ED Provider Notes (Signed)
Behavioral Health Progress Note  Date and Time: 01/02/2023 10:09 AM Name: Jonathan [name] MRN:  [id number]  Subjective:   Jonathan [name] is a 27 yo male with a PPHx significant for MDD, GAD, polysubstance use (cocaine, cannabis, alcohol, tobacco), medication noncompliance, with prior psychiatric hospitalizations (last [facility] 8/17-2/22), who initially presented to [facility] voluntarily on 12/29/2022 and admitted to [facility] on 12/31/2022 requesting detox and medication management. He has multiple psychosocial stressors. His UDS is + cocaine and marijuana on admission.   Patient evaluated on the unit.  Reports sleep and appetite have been adequate.  States mood is "okay" today. Rates depression unchanged from yesterday, recognizes this is likely secondary to cocaine withdrawal.  He recalls experiencing similar symptoms when he has discontinued cocaine use in the past.  He denies any other specific withdrawals.  He denies any cravings.  He denies suicidal ideation, homicidal ideations, and hallucinations.  Patient reports he continues to be motivated to work towards sobriety.  Encourage patient to create a list of ways in which staying sober will positively impact his life.  Most importantly, patient wishes to reestablish relationship with his son and be a part of his life.  He reports he has been in contact with his girlfriend, confirms she does not use any substances and is a positive influence in his life.  Patient reports some mild sedation on Zoloft, request that this medication be given in the evenings.  Otherwise, he has not noted any significant changes in his depression to starting this medication (this is his second dose today).  He denies any other side effects to currently prescribed medications.  Denies any somatic complaints.   Diagnosis:  Final diagnoses:  Substance induced mood disorder (HCC)  Alcohol use disorder  Stimulant use disorder  Tobacco use disorder  Cannabis use disorder    Total  Time spent with patient: 30 minutes  Substance Abuse History Alcohol: Drinks 2-3 times a week, will have up to 216 ounce beers at most.  Has been drinking since discharge in August. Tobacco: 1 PPD for several years Cannabis: Reports smoking occasionally Cocaine: Intranasal cocaine use for the past 2 years, reports spending unspecified amounts of money on a weekly basis. Periods of sobriety: Denies any. At most 1 week.  Other Illicit drugs: Denies fentanyl, methamphetamines IV drug use: Denies  Past Psychiatric Hx: Patient's previous psychiatric diagnoses include: MDD recurrent severe, GAD, cocaine use disorder, alcohol use disorder, cannabis use disorder.   Patient was previously hospitalized at [facility] on 11/17/2022-11/22/2022 where he was started on Zoloft at that time.  Patient admits to medication noncompliance, stopped taking Zoloft 1.5 weeks after discharge from [facility]. -Patient has 3 prior psychiatric hospitalizations at [facility] Past psychiatric medication: Reports taking Celexa few years back  Past Medical History:  Dx: Denies Seizures: denies Head Trauma/LOC/Concussion: Denies  Family History: Unknown to pt  Family Psychiatric  History: Pts mother was substance user (crack cocaine). Pts father also used substances, but recovered.  Suicide Hx: Denies Violence Hx: Denies  Social History Living Situation: unstable housing, lost his apartment. Has been unhoused sometime between June-April. Reports family is not supportive, has several relatives in the area.  Sometimes stays with gf. Stays at [employer] his job after being hospitalized, lost his section 8 housing.   Occupational hx: Currently unemployed, was previously working at [employer] tables Marital Status: single Children: son 40 yo, currently with  mother in [place] Legal: Denies any prior incarcerations.  Has upcoming court date on Nov  15th for child support Access to firearms: Denies  Current Medications:   Current Facility-Administered Medications  Medication Dose Route Frequency Provider Last Rate Last Admin   acetaminophen (TYLENOL) tablet 650 mg  650 mg Oral Q6H PRN Rankin, Shuvon B, NP   650 mg at 01/01/23 1843   alum & mag hydroxide-simeth (MAALOX/MYLANTA) 200-200-20 MG/5ML suspension 30 mL  30 mL Oral Q4H PRN Rankin, Shuvon B, NP       hydrOXYzine (ATARAX) tablet 25 mg  25 mg Oral TID PRN Rankin, Shuvon B, NP       magnesium hydroxide (MILK OF MAGNESIA) suspension 30 mL  30 mL Oral Daily PRN Rankin, Shuvon B, NP       nicotine (NICODERM CQ - dosed in mg/24 hours) patch 21 mg  21 mg Transdermal Once Lorri Frederick, MD   21 mg at 01/01/23 1236   nicotine (NICODERM CQ - dosed in mg/24 hours) patch 21 mg  21 mg Transdermal Daily PRN Carrion-Carrero, Karle Starch, MD       Melene Muller ON 01/03/2023] sertraline (ZOLOFT) tablet 50 mg  50 mg Oral QHS Carrion-Carrero, Kazuma Elena, MD       traZODone (DESYREL) tablet 50 mg  50 mg Oral QHS PRN Rankin, Shuvon B, NP   50 mg at 01/01/23 2112   Current Outpatient Medications  Medication Sig Dispense Refill   hydrOXYzine (ATARAX) 25 MG tablet Take 1 tablet (25 mg total) by mouth every 8 (eight) hours as needed for anxiety. (Patient not taking: Reported on 12/30/2022) 30 tablet 0   naproxen (NAPROSYN) 500 MG tablet Take 1 tablet (500 mg total) by mouth 2 (two) times daily. (Patient not taking: Reported on 12/30/2022) 30 tablet 0   neomycin-bacitracin-polymyxin (NEOSPORIN) OINT Apply 1 Application topically 2 (two) times daily. (Patient not taking: Reported on 12/30/2022)     nicotine (NICODERM CQ - DOSED IN MG/24 HOURS) 21 mg/24hr patch Place 1 patch (21 mg total) onto the skin daily. (Patient not taking: Reported on 12/30/2022) 28 patch 0   sertraline (ZOLOFT) 100 MG tablet Take 1 tablet (100 mg total) by mouth daily. (Patient not taking: Reported on 12/30/2022) 30 tablet 0   traZODone (DESYREL) 50 MG tablet Take 1 tablet (50 mg total) by mouth at bedtime. (Patient  not taking: Reported on 12/30/2022) 30 tablet 0    Labs  Lab Results:  Admission on 12/29/2022, Discharged on 12/31/2022  Component Date Value Ref Range Status   WBC 12/29/2022 8.6  4.0 - 10.5 K/uL Final   RBC 12/29/2022 4.95  4.22 - 5.81 MIL/uL Final   Hemoglobin 12/29/2022 15.6  13.0 - 17.0 g/dL Final   HCT 16/60/6301 45.2  39.0 - 52.0 % Final   MCV 12/29/2022 91.3  80.0 - 100.0 fL Final   MCH 12/29/2022 31.5  26.0 - 34.0 pg Final   MCHC 12/29/2022 34.5  30.0 - 36.0 g/dL Final   RDW 60/12/9321 13.5  11.5 - 15.5 % Final   Platelets 12/29/2022 356  150 - 400 K/uL Final   nRBC 12/29/2022 0.0  0.0 - 0.2 % Final   Neutrophils Relative % 12/29/2022 54  % Final   Neutro Abs 12/29/2022 4.6  1.7 - 7.7 K/uL Final   Lymphocytes Relative 12/29/2022 29  % Final   Lymphs Abs 12/29/2022 2.5  0.7 - 4.0 K/uL Final   Monocytes Relative 12/29/2022 12  % Final   Monocytes Absolute 12/29/2022 1.0  0.1 - 1.0 K/uL Final   Eosinophils Relative 12/29/2022 4  % Final  Eosinophils Absolute 12/29/2022 0.4  0.0 - 0.5 K/uL Final   Basophils Relative 12/29/2022 1  % Final   Basophils Absolute 12/29/2022 0.1  0.0 - 0.1 K/uL Final   Immature Granulocytes 12/29/2022 0  % Final   Abs Immature Granulocytes 12/29/2022 0.02  0.00 - 0.07 K/uL Final   Performed at [facility], [address], [place], [place] [address]   Sodium 12/29/2022 138  135 - 145 mmol/L Final   Potassium 12/29/2022 3.9  3.5 - 5.1 mmol/L Final   Chloride 12/29/2022 103  98 - 111 mmol/L Final   CO2 12/29/2022 23  22 - 32 mmol/L Final   Glucose, Bld 12/29/2022 81  70 - 99 mg/dL Final   Glucose reference range applies only to samples taken after fasting for at least 8 hours.   BUN 12/29/2022 14  6 - 20 mg/dL Final   Creatinine, Ser 12/29/2022 1.12  0.61 - 1.24 mg/dL Final   Calcium 60/45/4098 9.1  8.9 - 10.3 mg/dL Final   Total Protein 11/91/4782 6.7  6.5 - 8.1 g/dL Final   Albumin 95/62/1308 3.9  3.5 - 5.0 g/dL Final   AST 65/78/4696  21  15 - 41 U/L Final   ALT 12/29/2022 21  0 - 44 U/L Final   Alkaline Phosphatase 12/29/2022 37 (L)  38 - 126 U/L Final   Total Bilirubin 12/29/2022 0.6  0.3 - 1.2 mg/dL Final   GFR, Estimated 12/29/2022 >60  >60 mL/min Final   Comment: (NOTE) Calculated using the CKD-EPI Creatinine Equation (2021)    Anion gap 12/29/2022 12  5 - 15 Final   Performed at [facility], [address] [place], [place] [address]   Magnesium 12/29/2022 2.1  1.7 - 2.4 mg/dL Final   Performed at [facility], [address] [place], [place] [address]   Alcohol, Ethyl (B) 12/29/2022 <10  <10 mg/dL Final   Comment: (NOTE) Lowest detectable limit for serum alcohol is 10 mg/dL.  For medical purposes only. Performed at [facility], [address] [place], [place] [address]    Cholesterol 12/29/2022 213 (H)  0 - 200 mg/dL Final   Triglycerides 72/53/6644 238 (H)  <150 mg/dL Final   HDL 03/47/4259 40 (L)  >40 mg/dL Final   Total CHOL/HDL Ratio 12/29/2022 5.3  RATIO Final   VLDL 12/29/2022 48 (H)  0 - 40 mg/dL Final   LDL Cholesterol 12/29/2022 125 (H)  0 - 99 mg/dL Final   Comment:        Total Cholesterol/HDL:CHD Risk Coronary Heart Disease Risk Table                     Men   Women  1/2 Average Risk   3.4   3.3  Average Risk       5.0   4.4  2 X Average Risk   9.6   7.1  3 X Average Risk  23.4   11.0        Use the calculated Patient Ratio above and the CHD Risk Table to determine the patient's CHD Risk.        ATP III CLASSIFICATION (LDL):  <100     mg/dL   Optimal  563-875  mg/dL   Near or Above                    Optimal  130-159  mg/dL   Borderline  643-329  mg/dL   High  >295     mg/dL   Very High Performed at [facility], [address], [place], [place] [address]    TSH 12/29/2022 0.933  0.350 - 4.500 uIU/mL Final   Comment: Performed by a 3rd Generation assay with a functional sensitivity of <=0.01 uIU/mL. Performed at [facility], [address], [place], [place] [address]    RPR Ser Ql 12/29/2022 NON REACTIVE  NON REACTIVE Final   Performed at [facility], [address] [place], [place] [address]   Color, Urine 12/30/2022 YELLOW  YELLOW Final   APPearance 12/30/2022 CLEAR  CLEAR Final   Specific Gravity, Urine 12/30/2022 1.017  1.005 - 1.030 Final   pH 12/30/2022 7.0  5.0 - 8.0 Final   Glucose, UA 12/30/2022 NEGATIVE  NEGATIVE mg/dL Final   Hgb urine dipstick 12/30/2022 NEGATIVE  NEGATIVE Final   Bilirubin Urine 12/30/2022 NEGATIVE  NEGATIVE Final   Ketones, ur 12/30/2022 NEGATIVE  NEGATIVE mg/dL Final   Protein, ur 95/28/4132 NEGATIVE  NEGATIVE mg/dL Final   Nitrite 44/04/270 NEGATIVE  NEGATIVE Final   Leukocytes,Ua 12/30/2022 NEGATIVE  NEGATIVE Final   Performed at [facility], [address] [place], [place] [address]   POC Amphetamine UR 12/29/2022 None Detected  NONE DETECTED (Cut Off Level 1000 ng/mL) Final   POC Secobarbital (BAR) 12/29/2022 None Detected  NONE DETECTED (Cut Off Level 300 ng/mL) Final   POC Buprenorphine (BUP) 12/29/2022 None Detected  NONE DETECTED (Cut Off Level 10 ng/mL) Final   POC Oxazepam (BZO) 12/29/2022 None Detected  NONE DETECTED (Cut Off Level 300 ng/mL) Final   POC Cocaine UR 12/29/2022 Positive (A)  NONE DETECTED (Cut Off Level 300 ng/mL) Final   POC Methamphetamine UR 12/29/2022 None Detected (A)  NONE DETECTED (Cut Off Level 1000 ng/mL) Final   POC Morphine 12/29/2022 None Detected  NONE DETECTED (Cut Off Level 300 ng/mL) Final   POC Methadone UR 12/29/2022 None Detected  NONE DETECTED (Cut Off Level 300 ng/mL) Final   POC Oxycodone UR 12/29/2022 None Detected  NONE DETECTED (Cut Off Level 100 ng/mL) Final   POC Marijuana UR 12/29/2022 Positive (A)  NONE DETECTED (Cut Off Level 50 ng/mL) Final  Admission on 12/20/2022, Discharged on 12/20/2022  Component Date Value Ref Range Status   Sodium 12/20/2022 139  135 - 145 mmol/L Final   Potassium 12/20/2022 3.8  3.5 - 5.1 mmol/L  Final   Chloride 12/20/2022 106  98 - 111 mmol/L Final   CO2 12/20/2022 26  22 - 32 mmol/L Final   Glucose, Bld 12/20/2022 88  70 - 99 mg/dL Final   Glucose reference range applies only to samples taken after fasting for at least 8 hours.   BUN 12/20/2022 16  6 - 20 mg/dL Final   Creatinine, Ser 12/20/2022 1.15  0.61 - 1.24 mg/dL Final   Calcium 40/34/7425 8.3 (L)  8.9 - 10.3 mg/dL Final   GFR, Estimated 12/20/2022 >60  >60 mL/min Final   Comment: (NOTE) Calculated using the CKD-EPI Creatinine Equation (2021)    Anion gap 12/20/2022 7  5 - 15 Final   Performed at [facility], [address], [place], [place] [address]   WBC 12/20/2022 9.3  4.0 - 10.5 K/uL Final   RBC 12/20/2022 4.56  4.22 - 5.81 MIL/uL Final   Hemoglobin 12/20/2022 14.3  13.0 - 17.0 g/dL Final   HCT 75/64/3329 40.7  39.0 - 52.0 %  Final   MCV 12/20/2022 89.3  80.0 - 100.0 fL Final   MCH 12/20/2022 31.4  26.0 - 34.0 pg Final   MCHC 12/20/2022 35.1  30.0 - 36.0 g/dL Final   RDW 86/57/8469 13.5  11.5 - 15.5 % Final   Platelets 12/20/2022 254  150 - 400 K/uL Final   nRBC 12/20/2022 0.0  0.0 - 0.2 % Final   Performed at [facility], [address] [place], [place] [address]   Troponin I (High Sensitivity) 12/20/2022 3  <18 ng/L Final   Comment: (NOTE) Elevated high sensitivity troponin I (hsTnI) values and significant  changes across serial measurements may suggest ACS but many other  chronic and acute conditions are known to elevate hsTnI results.  Refer to the "Links" section for chest pain algorithms and additional  guidance. Performed at [facility], [address], [place], [place] [address]    Opiates 12/20/2022 NONE DETECTED  NONE DETECTED Final   Cocaine 12/20/2022 POSITIVE (A)  NONE DETECTED Final   Benzodiazepines 12/20/2022 NONE DETECTED  NONE DETECTED Final   Amphetamines 12/20/2022 NONE DETECTED  NONE DETECTED Final   Tetrahydrocannabinol  12/20/2022 POSITIVE (A)  NONE DETECTED Final   Barbiturates 12/20/2022 NONE DETECTED  NONE DETECTED Final   Comment: (NOTE) DRUG SCREEN FOR MEDICAL PURPOSES ONLY.  IF CONFIRMATION IS NEEDED FOR ANY PURPOSE, NOTIFY LAB WITHIN 5 DAYS.  LOWEST DETECTABLE LIMITS FOR URINE DRUG SCREEN Drug Class                     Cutoff (ng/mL) Amphetamine and metabolites    1000 Barbiturate and metabolites    200 Benzodiazepine                 200 Opiates and metabolites        300 Cocaine and metabolites        300 THC                            50 Performed at [facility], [address], [place], [place] [address]   Admission on 11/17/2022, Discharged on 11/21/2022  Component Date Value Ref Range Status   Cholesterol 11/20/2022 171  0 - 200 mg/dL Final   Triglycerides 27/25/3664 192 (H)  <150 mg/dL Final   HDL 40/34/7425 37 (L)  >40 mg/dL Final   Total CHOL/HDL Ratio 11/20/2022 4.6  RATIO Final   VLDL 11/20/2022 38  0 - 40 mg/dL Final   LDL Cholesterol 11/20/2022 96  0 - 99 mg/dL Final   Comment:        Total Cholesterol/HDL:CHD Risk Coronary Heart Disease Risk Table                     Men   Women  1/2 Average Risk   3.4   3.3  Average Risk       5.0   4.4  2 X Average Risk   9.6   7.1  3 X Average Risk  23.4   11.0        Use the calculated Patient Ratio above and the CHD Risk Table to determine the patient's CHD Risk.        ATP III CLASSIFICATION (LDL):  <100     mg/dL   Optimal  956-387  mg/dL   Near or Above  Optimal  130-159  mg/dL   Borderline  191-478  mg/dL   High  >295     mg/dL   Very High Performed at [facility], [address] [place], [place] [address]    Hgb A1c MFr Bld 11/20/2022 5.5  4.8 - 5.6 % Final   Comment: (NOTE) Pre diabetes:          5.7%-6.4%  Diabetes:              >6.4%  Glycemic control for   <7.0% adults with diabetes    Mean Plasma Glucose 11/20/2022 111.15  mg/dL Final   Performed  at [facility], [address] [place], [place] [address]   TSH 11/20/2022 1.248  0.350 - 4.500 uIU/mL Final   Comment: Performed by a 3rd Generation assay with a functional sensitivity of <=0.01 uIU/mL. Performed at [facility], [address], [place], [place] [address]    Vit D, 25-Hydroxy 11/20/2022 26.84 (L)  30 - 100 ng/mL Final   Comment: (NOTE) Vitamin D deficiency has been defined by the Institute of Medicine  and an Endocrine Society practice guideline as a level of serum 25-OH  vitamin D less than 20 ng/mL (1,2). The Endocrine Society went on to  further define vitamin D insufficiency as a level between 21 and 29  ng/mL (2).  1. IOM (Institute of Medicine). 2010. Dietary reference intakes for  calcium and D. [place]: [employer]. 2. Holick MF, Binkley Whitecone, Bischoff-Ferrari HA, et al. Evaluation,  treatment, and prevention of vitamin D deficiency: an Endocrine  Society clinical practice guideline, JCEM. 2011 Jul; 96(7): 1911-30.  Performed at [facility], [address], [place], [place] [address]   Admission on 11/17/2022, Discharged on 11/17/2022  Component Date Value Ref Range Status   Sodium 11/17/2022 138  135 - 145 mmol/L Final   Potassium 11/17/2022 3.5  3.5 - 5.1 mmol/L Final   Chloride 11/17/2022 103  98 - 111 mmol/L Final   CO2 11/17/2022 22  22 - 32 mmol/L Final   Glucose, Bld 11/17/2022 85  70 - 99 mg/dL Final   Glucose reference range applies only to samples taken after fasting for at least 8 hours.   BUN 11/17/2022 8  6 - 20 mg/dL Final   Creatinine, Ser 11/17/2022 1.36 (H)  0.61 - 1.24 mg/dL Final   Calcium 13/24/4010 9.0  8.9 - 10.3 mg/dL Final   Total Protein 27/25/3664 6.8  6.5 - 8.1 g/dL Final   Albumin 40/34/7425 4.1  3.5 - 5.0 g/dL Final   AST 95/63/8756 23  15 - 41 U/L Final   ALT 11/17/2022 18  0 - 44 U/L Final   Alkaline Phosphatase 11/17/2022 30 (L)  38 - 126 U/L Final   Total Bilirubin  11/17/2022 0.4  0.3 - 1.2 mg/dL Final   GFR, Estimated 11/17/2022 >60  >60 mL/min Final   Comment: (NOTE) Calculated using the CKD-EPI Creatinine Equation (2021)    Anion gap 11/17/2022 13  5 - 15 Final   Performed at [facility], [address], [place], [place] [address]   Alcohol, Ethyl (B) 11/17/2022 39 (H)  <10 mg/dL Final   Comment: (NOTE) Lowest detectable limit for serum alcohol is 10 mg/dL.  For medical purposes only. Performed at [facility], [address] [place], [place] [address]    Salicylate Lvl 11/17/2022 <7.0 (L)  7.0 - 30.0 mg/dL Final  Performed at [facility], [address], [place], [place] [address]   Acetaminophen (Tylenol), Serum 11/17/2022 <10 (L)  10 - 30 ug/mL Final   Comment: (NOTE) Therapeutic concentrations vary significantly. A range of 10-30 ug/mL  may be an effective concentration for many patients. However, some  are best treated at concentrations outside of this range. Acetaminophen concentrations >150 ug/mL at 4 hours after ingestion  and >50 ug/mL at 12 hours after ingestion are often associated with  toxic reactions.  Performed at [facility], [address], [place], [place] [address]    WBC 11/17/2022 9.6  4.0 - 10.5 K/uL Final   RBC 11/17/2022 5.20  4.22 - 5.81 MIL/uL Final   Hemoglobin 11/17/2022 15.9  13.0 - 17.0 g/dL Final   HCT 09/81/1914 46.3  39.0 - 52.0 % Final   MCV 11/17/2022 89.0  80.0 - 100.0 fL Final   MCH 11/17/2022 30.6  26.0 - 34.0 pg Final   MCHC 11/17/2022 34.3  30.0 - 36.0 g/dL Final   RDW 78/29/5621 13.3  11.5 - 15.5 % Final   Platelets 11/17/2022 307  150 - 400 K/uL Final   nRBC 11/17/2022 0.0  0.0 - 0.2 % Final   Performed at [facility], [address], [place], [place] [address]   Opiates 11/17/2022 NONE DETECTED  NONE DETECTED Final   Cocaine 11/17/2022 POSITIVE (A)  NONE DETECTED Final   Benzodiazepines 11/17/2022 NONE DETECTED  NONE DETECTED Final   Amphetamines  11/17/2022 NONE DETECTED  NONE DETECTED Final   Tetrahydrocannabinol 11/17/2022 POSITIVE (A)  NONE DETECTED Final   Barbiturates 11/17/2022 NONE DETECTED  NONE DETECTED Final   Comment: (NOTE) DRUG SCREEN FOR MEDICAL PURPOSES ONLY.  IF CONFIRMATION IS NEEDED FOR ANY PURPOSE, NOTIFY LAB WITHIN 5 DAYS.  LOWEST DETECTABLE LIMITS FOR URINE DRUG SCREEN Drug Class                     Cutoff (ng/mL) Amphetamine and metabolites    1000 Barbiturate and metabolites    200 Benzodiazepine                 200 Opiates and metabolites        300 Cocaine and metabolites        300 THC                            50 Performed at [facility], [address], [place], [place] [address]   Admission on 08/16/2022, Discharged on 08/16/2022  Component Date Value Ref Range Status   Sodium 08/16/2022 135  135 - 145 mmol/L Final   Potassium 08/16/2022 3.8  3.5 - 5.1 mmol/L Final   Chloride 08/16/2022 106  98 - 111 mmol/L Final   CO2 08/16/2022 22  22 - 32 mmol/L Final   Glucose, Bld 08/16/2022 140 (H)  70 - 99 mg/dL Final   Glucose reference range applies only to samples taken after fasting for at least 8 hours.   BUN 08/16/2022 13  6 - 20 mg/dL Final   Creatinine, Ser 08/16/2022 1.05  0.61 - 1.24 mg/dL Final   Calcium 62/95/2841 8.4 (L)  8.9 - 10.3 mg/dL Final   GFR, Estimated 08/16/2022 >60  >60 mL/min Final   Comment: (NOTE) Calculated using the CKD-EPI Creatinine Equation (2021)    Anion gap 08/16/2022 7  5 - 15 Final   Performed at [facility], [address] [place], [place] [address]   WBC 08/16/2022 8.7  4.0 - 10.5 K/uL Final   RBC 08/16/2022 5.02  4.22 - 5.81 MIL/uL Final   Hemoglobin 08/16/2022 15.5  13.0 - 17.0 g/dL Final   HCT 56/21/3086 45.2  39.0 - 52.0 % Final   MCV 08/16/2022 90.0  80.0 - 100.0 fL Final   MCH 08/16/2022 30.9  26.0 - 34.0 pg Final   MCHC 08/16/2022 34.3  30.0 - 36.0 g/dL Final   RDW 57/84/6962 13.7  11.5 - 15.5 % Final   Platelets  08/16/2022 271  150 - 400 K/uL Final   nRBC 08/16/2022 0.0  0.0 - 0.2 % Final   Performed at [facility], [address] [place], [place] [address]   Troponin I (High Sensitivity) 08/16/2022 <2  <18 ng/L Final   Comment: (NOTE) Elevated high sensitivity troponin I (hsTnI) values and significant  changes across serial measurements may suggest ACS but many other  chronic and acute conditions are known to elevate hsTnI results.  Refer to the "Links" section for chest pain algorithms and additional  guidance. Performed at [facility], [address], [place], [place] [address]   Admission on 07/30/2022, Discharged on 07/31/2022  Component Date Value Ref Range Status   WBC 07/30/2022 11.1 (H)  4.0 - 10.5 K/uL Final   RBC 07/30/2022 4.60  4.22 - 5.81 MIL/uL Final   Hemoglobin 07/30/2022 14.3  13.0 - 17.0 g/dL Final   HCT 04/04/7251 40.5  39.0 - 52.0 % Final   MCV 07/30/2022 88.0  80.0 - 100.0 fL Final   MCH 07/30/2022 31.1  26.0 - 34.0 pg Final   MCHC 07/30/2022 35.3  30.0 - 36.0 g/dL Final   RDW 66/44/0347 14.0  11.5 - 15.5 % Final   Platelets 07/30/2022 380  150 - 400 K/uL Final   nRBC 07/30/2022 0.0  0.0 - 0.2 % Final   Neutrophils Relative % 07/30/2022 56  % Final   Neutro Abs 07/30/2022 6.3  1.7 - 7.7 K/uL Final   Lymphocytes Relative 07/30/2022 26  % Final   Lymphs Abs 07/30/2022 2.9  0.7 - 4.0 K/uL Final   Monocytes Relative 07/30/2022 10  % Final   Monocytes Absolute 07/30/2022 1.1 (H)  0.1 - 1.0 K/uL Final   Eosinophils Relative 07/30/2022 6  % Final   Eosinophils Absolute 07/30/2022 0.7 (H)  0.0 - 0.5 K/uL Final   Basophils Relative 07/30/2022 1  % Final   Basophils Absolute 07/30/2022 0.1  0.0 - 0.1 K/uL Final   Immature Granulocytes 07/30/2022 1  % Final   Abs Immature Granulocytes 07/30/2022 0.05  0.00 - 0.07 K/uL Final   Performed at [facility], [address] [place], [place] [address]   Sodium 07/30/2022 138  135 - 145  mmol/L Final   Potassium 07/30/2022 4.0  3.5 - 5.1 mmol/L Final   Chloride 07/30/2022 105  98 - 111 mmol/L Final   CO2 07/30/2022 26  22 - 32 mmol/L Final   Glucose, Bld 07/30/2022 94  70 - 99 mg/dL Final   Glucose reference range applies only to samples taken after fasting for at least 8 hours.   BUN 07/30/2022 13  6 - 20 mg/dL Final   Creatinine, Ser 07/30/2022 1.15  0.61 - 1.24 mg/dL Final   Calcium 63/87/5643 9.3  8.9 - 10.3 mg/dL Final   Total Protein 32/95/1884 6.0 (L)  6.5 - 8.1  g/dL Final   Albumin 01/60/1093 3.5  3.5 - 5.0 g/dL Final   AST 23/55/7322 19  15 - 41 U/L Final   ALT 07/30/2022 15  0 - 44 U/L Final   Alkaline Phosphatase 07/30/2022 44  38 - 126 U/L Final   Total Bilirubin 07/30/2022 0.3  0.3 - 1.2 mg/dL Final   GFR, Estimated 07/30/2022 >60  >60 mL/min Final   Comment: (NOTE) Calculated using the CKD-EPI Creatinine Equation (2021)    Anion gap 07/30/2022 7  5 - 15 Final   Performed at [facility], [address] [place], [place] [address]   Hgb A1c MFr Bld 07/30/2022 5.7 (H)  4.8 - 5.6 % Final   Comment: (NOTE) Pre diabetes:          5.7%-6.4%  Diabetes:              >6.4%  Glycemic control for   <7.0% adults with diabetes    Mean Plasma Glucose 07/30/2022 116.89  mg/dL Final   Performed at [facility], [address] [place], [place] [address]   Alcohol, Ethyl (B) 07/30/2022 <10  <10 mg/dL Final   Comment: (NOTE) Lowest detectable limit for serum alcohol is 10 mg/dL.  For medical purposes only. Performed at [facility], [address] [place], [place] [address]    Cholesterol 07/30/2022 193  0 - 200 mg/dL Final   Triglycerides 15/17/6160 232 (H)  <150 mg/dL Final   HDL 73/71/0626 37 (L)  >40 mg/dL Final   Total CHOL/HDL Ratio 07/30/2022 5.2  RATIO Final   VLDL 07/30/2022 46 (H)  0 - 40 mg/dL Final   LDL Cholesterol 07/30/2022 110 (H)  0 - 99 mg/dL Final   Comment:        Total Cholesterol/HDL:CHD Risk Coronary Heart Disease  Risk Table                     Men   Women  1/2 Average Risk   3.4   3.3  Average Risk       5.0   4.4  2 X Average Risk   9.6   7.1  3 X Average Risk  23.4   11.0        Use the calculated Patient Ratio above and the CHD Risk Table to determine the patient's CHD Risk.        ATP III CLASSIFICATION (LDL):  <100     mg/dL   Optimal  948-546  mg/dL   Near or Above                    Optimal  130-159  mg/dL   Borderline  270-350  mg/dL   High  >093     mg/dL   Very High Performed at [facility], [address], [place], [place] [address]    POC Amphetamine UR 07/30/2022 None Detected  NONE DETECTED (Cut Off Level 1000 ng/mL) Final   POC Secobarbital (BAR) 07/30/2022 None Detected  NONE DETECTED (Cut Off Level 300 ng/mL) Final   POC Buprenorphine (BUP) 07/30/2022 None Detected  NONE DETECTED (Cut Off Level 10 ng/mL) Final   POC Oxazepam (BZO) 07/30/2022 None Detected  NONE DETECTED (Cut Off Level 300 ng/mL) Final   POC Cocaine UR 07/30/2022 Positive (A)  NONE DETECTED (Cut Off Level 300 ng/mL) Final   POC Methamphetamine UR 07/30/2022 None Detected  NONE DETECTED (Cut Off Level 1000 ng/mL)  Final   POC Morphine 07/30/2022 None Detected  NONE DETECTED (Cut Off Level 300 ng/mL) Final   POC Methadone UR 07/30/2022 None Detected  NONE DETECTED (Cut Off Level 300 ng/mL) Final   POC Oxycodone UR 07/30/2022 None Detected  NONE DETECTED (Cut Off Level 100 ng/mL) Final   POC Marijuana UR 07/30/2022 Positive (A)  NONE DETECTED (Cut Off Level 50 ng/mL) Final   TSH 07/30/2022 0.973  0.350 - 4.500 uIU/mL Final   Comment: Performed by a 3rd Generation assay with a functional sensitivity of <=0.01 uIU/mL. Performed at [facility], [address] [place], [place] [address]     Blood Alcohol level:  Lab Results  Component Value Date   ETH <10 12/29/2022   ETH 39 (H) 11/17/2022    Metabolic Disorder Labs: Lab Results  Component Value Date   HGBA1C 5.5 11/20/2022   MPG 111.15  11/20/2022   MPG 116.89 07/30/2022   No results found for: "PROLACTIN" Lab Results  Component Value Date   CHOL 213 (H) 12/29/2022   TRIG 238 (H) 12/29/2022   HDL 40 (L) 12/29/2022   CHOLHDL 5.3 12/29/2022   VLDL 48 (H) 12/29/2022   LDLCALC 125 (H) 12/29/2022   LDLCALC 96 11/20/2022    Therapeutic Lab Levels: No results found for: "LITHIUM" Lab Results  Component Value Date   VALPROATE 61 05/26/2017   VALPROATE 40 (L) 08/31/2015   No results found for: "CBMZ"  Physical Findings   AIMS    Flowsheet Row [facility]) [facility]) [facility] Ad[facility] (Dis[facility]  AIMS Total Score 0 0 0      AUDIT    Flowsheet Row [facility]) [facility] Ad[facility] (Discharged) [facility] Ad[facility] (Discharged[facility] Ad[facility] ([facility]) [facility]  Alcohol Use Disorder Identification Test Final Score (AUDIT) 4 3 5 1       PHQ2-9    Flowsheet Row ED from 12/31/2022 in [facility] ED from 12/29/2022 in [facility] ED from 07/30/2022 in [place]  PHQ-2 Total Score 2 2 2   PHQ-9 Total Score 16 15 3       Flowsheet Row ED from 12/31/2022 in [facility] ED from 12/29/2022 in [facility] ED from 12/20/2022 in [facility] Department [facility]  C-SSRS RISK CATEGORY No Risk High Risk No Risk        Musculoskeletal  Strength & Muscle Tone: within normal limits Gait & Station: normal Patient leans:  N/A  Psychiatric Specialty Exam  Presentation  General Appearance:  Appropriate for Environment  Eye Contact: Good  Speech: Clear and Coherent; Normal Rate  Speech Volume: Normal  Handedness: -- (not assessed)   Mood and Affect  Mood: -- [place] today")  Affect: Congruent; Full Range   Thought Process  Thought Processes: Linear  Descriptions of Associations:Intact  Orientation:-- (not assessed)  Thought Content:Logical  Diagnosis of Schizophrenia or Schizoaffective disorder in past: No    Hallucinations:Hallucinations: None  Ideas of Reference:None  Suicidal Thoughts:Suicidal Thoughts: No  Homicidal Thoughts:Homicidal Thoughts: No   Sensorium  Memory: Immediate Good; Recent Good; Remote Good  Judgment: Fair  Insight: Fair   Chartered certified accountant: Fair  Attention Span: Fair  Recall: [employer] of Knowledge: Fair  Language: Fair   Psychomotor Activity  Psychomotor Activity: Psychomotor Activity: Normal   Assets  Assets: Desire for Improvement; Resilience; Communication Skills   Sleep  Sleep: Sleep: Good   Nutritional Assessment (For OBS and [facility] admissions only) Has the patient had a weight loss or gain of 10 pounds or more in the last 3 months?: No Has the patient had a decrease in food intake/or appetite?: No Does the patient have dental problems?: No Does the patient have eating habits or behaviors that may be indicators of an eating disorder including binging or inducing vomiting?: No Has the patient recently lost weight without trying?: 0 Has the patient been eating poorly because of a decreased appetite?: 0 Malnutrition Screening Tool Score: 0    Physical Exam  Physical Exam Vitals and nursing note reviewed.  Constitutional:      General: He is not in acute distress.    Appearance: He is not ill-appearing.  HENT:     Head: Normocephalic and atraumatic.  Pulmonary:     Effort: Pulmonary effort  is normal. No respiratory distress.  Skin:    General: Skin is warm and dry.  Neurological:     General: No focal deficit present.    Review of Systems  All other systems reviewed and are negative.  Blood pressure 123/69, pulse 65, temperature 97.9 F (36.6 C), temperature source Oral, resp. rate 18, SpO2 99%. There is no height or weight on file to calculate BMI.  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) Tobacco use disorder Cannabis use disorder  Alcohol use disorder     Alcohol use disorder  -VSS - EtOH negative, at approx >72 hours since last drink -Continue to monitor   Cannabis use disorder   Cocaine use disorder -Continue to monitor   MDD vs substance-induced mood disorder  GAD -Continue Zoloft 50 mg, changed to nightly dose   Tobacco use disorder  -Encourage smoking cessation - Continue nicotine patch 21 mg   Other PRNs:  Trazodone 50 mg nightly as needed Tylenol 650 mg every 6 hours as needed Maalox Mylanta every 4 hours as needed Atarax 25 mg 3 times daily as needed Milk of magnesia suspension daily as needed     Labs/imaging reviewed: CMP showing ALT 37 unremarkable;CBC unremarkable; magnesium WNL; ethanol <10; lipid panel (not fasting) showing cholesterol 213, TGs 238, HDL 40, VLDL 48, LDL 125; TSH WNL; RPR nonreactive; UDS positive for cocaine and THC; UA unremarkable EKG on 12/30/2022: QTc 395     Recommendations  Based on my evaluation the patient does not appear to have an emergency medical condition.    Per LCSW, patient was accepted at [facility], can be discharged at 9 AM on Fri 01/02/2023.   Signed: Lorri Frederick, MD 01/02/2023 10:09 AM

## 2023-01-02 NOTE — Group Note (Signed)
Group Topic: Communication  Group Date: 01/02/2023 Start Time: 2000 End Time: 2030 Facilitators: Rae Lips B  Department: [facility]  Number of Participants: 3  Group Focus: acceptance Treatment Modality:  Individual Therapy Interventions utilized were leisure development Purpose: express feelings  Name: Jonathan [name] Date of Birth: [birth year]  MR: [id number]    Level of Participation: active Quality of Participation: attentive and cooperative Interactions with others: gave feedback Mood/Affect: appropriate Triggers (if applicable): NA Cognition: coherent/clear Progress: Gaining insight Response: NA Plan: patient will be encouraged to keep going to groups.   Patients Problems:  Patient Active Problem List   Diagnosis Date Noted   Substance induced mood disorder (HCC) 12/31/2022   Polysubstance abuse (HCC) 12/31/2022   Mild alcohol use disorder 11/18/2022   Insomnia 11/18/2022   MDD (major depressive disorder), recurrent episode (HCC) 07/30/2022   Alcohol use disorder 01/20/2022   Delta-9-tetrahydrocannabinol (THC) dependence (HCC) 01/20/2022   Cocaine use disorder (HCC) 01/20/2022   Pneumonia 05/23/2017   MDD (major depressive disorder), recurrent severe, without psychosis (HCC) 05/22/2017

## 2023-01-02 NOTE — ED Notes (Signed)
Patient took scheduled medications. Calm and collected. Patient denies SI/HI, A/VH. Denies any other detox symptoms at this time. No further complaints. Patient verbally contracted for safety. Environment secured. Safety check in place according to facility policy.

## 2023-01-02 NOTE — ED Notes (Signed)
 Pt was provided lunch

## 2023-01-02 NOTE — ED Notes (Signed)
Pt was provided breakfast.

## 2023-01-02 NOTE — ED Notes (Signed)
Pt was provided dinner.

## 2023-01-02 NOTE — ED Notes (Signed)
Patient sitting in cafeteria interacting with peers and eating meal. No acute distress noted. No concerns voiced. Informed patient to notify staff with any needs or assistance. Patient verbalized understanding or agreement. Safety checks in place per facility policy.

## 2023-01-02 NOTE — ED Notes (Signed)
Patient sitting in dayroom, calm and collected. No acute distress noted, no complaints at this time. Environment secured. Safety checks in place according to facility policy.

## 2023-01-02 NOTE — Group Note (Signed)
Group Topic: Wellness  Group Date: 01/02/2023 Start Time: 1000 End Time: 1030 Facilitators: Londell Moh, NT  Department: [facility]  Number of Participants: 5  Group Focus: check in and leisure skills Treatment Modality:  Psychoeducation Interventions utilized were group exercise and mental fitness Purpose: increase insight  Name: Jonathan [name] Date of Birth: 04-Aug-1995  MR: [id number]    Level of Participation: active Quality of Participation: engaged Interactions with others: gave feedback Mood/Affect: appropriate Triggers (if applicable): n/a Cognition: coherent/clear Progress: Gaining insight Response: Pt was active during group and expressed his goals for the day Plan: patient will be encouraged to continue to attend groups  Patients Problems:  Patient Active Problem List   Diagnosis Date Noted   Substance induced mood disorder (HCC) 12/31/2022   Polysubstance abuse (HCC) 12/31/2022   Mild alcohol use disorder 11/18/2022   Insomnia 11/18/2022   MDD (major depressive disorder), recurrent episode (HCC) 07/30/2022   Alcohol use disorder 01/20/2022   Delta-9-tetrahydrocannabinol (THC) dependence (HCC) 01/20/2022   Cocaine use disorder (HCC) 01/20/2022   Pneumonia 05/23/2017   MDD (major depressive disorder), recurrent severe, without psychosis (HCC) 05/22/2017

## 2023-01-02 NOTE — ED Notes (Signed)
Patient is in hallway talking with other patient, no acute distress noted, night time medication has been given, will continue to monitor patient for safety

## 2023-01-03 DIAGNOSIS — F1914 Other psychoactive substance abuse with psychoactive substance-induced mood disorder: Secondary | ICD-10-CM | POA: Diagnosis not present

## 2023-01-03 DIAGNOSIS — F1210 Cannabis abuse, uncomplicated: Secondary | ICD-10-CM | POA: Diagnosis not present

## 2023-01-03 DIAGNOSIS — F1410 Cocaine abuse, uncomplicated: Secondary | ICD-10-CM | POA: Diagnosis not present

## 2023-01-03 DIAGNOSIS — F1010 Alcohol abuse, uncomplicated: Secondary | ICD-10-CM | POA: Diagnosis not present

## 2023-01-03 MED ORDER — NICOTINE 21 MG/24HR TD PT24: 21.0000 mg | MEDICATED_PATCH | Freq: Every day | TRANSDERMAL | 0 refills | Status: DC | PRN

## 2023-01-03 MED ORDER — TRAZODONE HCL 50 MG PO TABS: 50.0000 mg | ORAL_TABLET | Freq: Every evening | ORAL | 0 refills | Status: DC | PRN

## 2023-01-03 MED ORDER — HYDROXYZINE HCL 25 MG PO TABS: 25.0000 mg | ORAL_TABLET | Freq: Three times a day (TID) | ORAL | 0 refills | Status: DC | PRN

## 2023-01-03 MED ORDER — SERTRALINE HCL 50 MG PO TABS: 50.0000 mg | ORAL_TABLET | Freq: Every day | ORAL | 0 refills | Status: DC

## 2023-01-03 NOTE — ED Provider Notes (Signed)
Behavioral Health Progress Note  Date and Time: 01/03/2023 10:33 AM Name: Jonathan [name] MRN:  [id number]  Subjective:   Jonathan [name] is a 27 yo male with a PPHx significant for MDD, GAD, polysubstance use (cocaine, cannabis, alcohol, tobacco), medication noncompliance, with prior psychiatric hospitalizations (last [facility] 8/17-2/22), who initially presented to [facility] voluntarily on 12/29/2022 and admitted to [facility] on 12/31/2022 requesting detox and medication management. He has multiple psychosocial stressors. His UDS is + cocaine and marijuana on admission.   Today patient was evaluated on the unit.  He reports sleep and appetite have been good.  He describes his mood today as "wonderful", describes feeling motivated for his discharge tomorrow to [facility].  He denies any symptoms of depression or anxiety.  He reports he is benefiting from group therapy, has no concerns at this moment.  Patient will receive scheduled Zoloft as nightly dose, as he reports drowsiness when taking it in the morning.  Stated goal for today is to "continue working on myself".  On assessment today he done denies any suicidal ideations, homicidal ideations, or hallucinations.  Patient denies any side effects to currently prescribed psychiatric medications.  Denies any somatic complaints, reports regular bowel movements.  Denies any withdrawal symptoms.  Denies any cravings.   Diagnosis:  Final diagnoses:  Substance induced mood disorder (HCC)  Alcohol use disorder  Stimulant use disorder  Tobacco use disorder  Cannabis use disorder    Total Time spent with patient: 30 minutes  Substance Abuse History Alcohol: Drinks 2-3 times a week, will have up to 216 ounce beers at most.  Has been drinking since discharge in August. Tobacco: 1 PPD for several years Cannabis: Reports smoking occasionally Cocaine: Intranasal cocaine use for the past 2 years, reports spending unspecified amounts of money on a weekly basis. Periods of  sobriety: Denies any. At most 1 week.  Other Illicit drugs: Denies fentanyl, methamphetamines IV drug use: Denies  Past Psychiatric Hx: Patient's previous psychiatric diagnoses include: MDD recurrent severe, GAD, cocaine use disorder, alcohol use disorder, cannabis use disorder.   Patient was previously hospitalized at [facility] on 11/17/2022-11/22/2022 where he was started on Zoloft at that time.  Patient admits to medication noncompliance, stopped taking Zoloft 1.5 weeks after discharge from [facility]. -Patient has 3 prior psychiatric hospitalizations at [facility] Past psychiatric medication: Reports taking Celexa few years back  Past Medical History:  Dx: Denies Seizures: denies Head Trauma/LOC/Concussion: Denies  Family History: Unknown to pt  Family Psychiatric  History: Pts mother was substance user (crack cocaine). Pts father also used substances, but recovered.  Suicide Hx: Denies Violence Hx: Denies  Social History Living Situation: unstable housing, lost his apartment. Has been unhoused sometime between June-April. Reports family is not supportive, has several relatives in the area.  Sometimes stays with gf. Stays at [employer] his job after being hospitalized, lost his section 8 housing.   Occupational hx: Currently unemployed, was previously working at [employer] tables Marital Status: single Children: son 49 yo, currently with  mother in [place] Legal: Denies any prior incarcerations.  Has upcoming court date on Nov 15th for child support Access to firearms: Denies  Current Medications:  Current Facility-Administered Medications  Medication Dose Route Frequency Provider Last Rate Last Admin   acetaminophen (TYLENOL) tablet 650 mg  650 mg Oral Q6H PRN Rankin, Shuvon B, NP   650 mg at 01/03/23 0555   alum & mag hydroxide-simeth (MAALOX/MYLANTA) 200-200-20 MG/5ML suspension 30 mL  30 mL Oral Q4H PRN  Rankin, Shuvon B, NP       hydrOXYzine (ATARAX) tablet 25 mg  25 mg Oral TID  PRN Rankin, Shuvon B, NP       magnesium hydroxide (MILK OF MAGNESIA) suspension 30 mL  30 mL Oral Daily PRN Rankin, Shuvon B, NP       nicotine (NICODERM CQ - dosed in mg/24 hours) patch 21 mg  21 mg Transdermal Daily PRN Carrion-Carrero, Kensington Rios, MD       sertraline (ZOLOFT) tablet 50 mg  50 mg Oral QHS Carrion-Carrero, Jhaden Pizzuto, MD       traZODone (DESYREL) tablet 50 mg  50 mg Oral QHS PRN Rankin, Shuvon B, NP   50 mg at 01/02/23 2145   Current Outpatient Medications  Medication Sig Dispense Refill   hydrOXYzine (ATARAX) 25 MG tablet Take 1 tablet (25 mg total) by mouth every 8 (eight) hours as needed for anxiety. (Patient not taking: Reported on 12/30/2022) 30 tablet 0   naproxen (NAPROSYN) 500 MG tablet Take 1 tablet (500 mg total) by mouth 2 (two) times daily. (Patient not taking: Reported on 12/30/2022) 30 tablet 0   neomycin-bacitracin-polymyxin (NEOSPORIN) OINT Apply 1 Application topically 2 (two) times daily. (Patient not taking: Reported on 12/30/2022)     nicotine (NICODERM CQ - DOSED IN MG/24 HOURS) 21 mg/24hr patch Place 1 patch (21 mg total) onto the skin daily. (Patient not taking: Reported on 12/30/2022) 28 patch 0   sertraline (ZOLOFT) 100 MG tablet Take 1 tablet (100 mg total) by mouth daily. (Patient not taking: Reported on 12/30/2022) 30 tablet 0   traZODone (DESYREL) 50 MG tablet Take 1 tablet (50 mg total) by mouth at bedtime. (Patient not taking: Reported on 12/30/2022) 30 tablet 0    Labs  Lab Results:  Admission on 12/29/2022, Discharged on 12/31/2022  Component Date Value Ref Range Status   WBC 12/29/2022 8.6  4.0 - 10.5 K/uL Final   RBC 12/29/2022 4.95  4.22 - 5.81 MIL/uL Final   Hemoglobin 12/29/2022 15.6  13.0 - 17.0 g/dL Final   HCT 66/44/0347 45.2  39.0 - 52.0 % Final   MCV 12/29/2022 91.3  80.0 - 100.0 fL Final   MCH 12/29/2022 31.5  26.0 - 34.0 pg Final   MCHC 12/29/2022 34.5  30.0 - 36.0 g/dL Final   RDW 42/59/5638 13.5  11.5 - 15.5 % Final   Platelets  12/29/2022 356  150 - 400 K/uL Final   nRBC 12/29/2022 0.0  0.0 - 0.2 % Final   Neutrophils Relative % 12/29/2022 54  % Final   Neutro Abs 12/29/2022 4.6  1.7 - 7.7 K/uL Final   Lymphocytes Relative 12/29/2022 29  % Final   Lymphs Abs 12/29/2022 2.5  0.7 - 4.0 K/uL Final   Monocytes Relative 12/29/2022 12  % Final   Monocytes Absolute 12/29/2022 1.0  0.1 - 1.0 K/uL Final   Eosinophils Relative 12/29/2022 4  % Final   Eosinophils Absolute 12/29/2022 0.4  0.0 - 0.5 K/uL Final   Basophils Relative 12/29/2022 1  % Final   Basophils Absolute 12/29/2022 0.1  0.0 - 0.1 K/uL Final   Immature Granulocytes 12/29/2022 0  % Final   Abs Immature Granulocytes 12/29/2022 0.02  0.00 - 0.07 K/uL Final   Performed at [facility], [address], [place], [place] [address]   Sodium 12/29/2022 138  135 - 145 mmol/L Final   Potassium 12/29/2022 3.9  3.5 - 5.1 mmol/L Final   Chloride 12/29/2022 103  98 - 111 mmol/L Final   CO2 12/29/2022 23  22 - 32 mmol/L Final   Glucose, Bld 12/29/2022 81  70 - 99 mg/dL Final   Glucose reference range applies only to samples taken after fasting for at least 8 hours.   BUN 12/29/2022 14  6 - 20 mg/dL Final   Creatinine, Ser 12/29/2022 1.12  0.61 - 1.24 mg/dL Final   Calcium 40/98/1191 9.1  8.9 - 10.3 mg/dL Final   Total Protein 47/82/9562 6.7  6.5 - 8.1 g/dL Final   Albumin 13/10/6576 3.9  3.5 - 5.0 g/dL Final   AST 46/96/2952 21  15 - 41 U/L Final   ALT 12/29/2022 21  0 - 44 U/L Final   Alkaline Phosphatase 12/29/2022 37 (L)  38 - 126 U/L Final   Total Bilirubin 12/29/2022 0.6  0.3 - 1.2 mg/dL Final   GFR, Estimated 12/29/2022 >60  >60 mL/min Final   Comment: (NOTE) Calculated using the CKD-EPI Creatinine Equation (2021)    Anion gap 12/29/2022 12  5 - 15 Final   Performed at [facility], [address] [place], [place] [address]   Magnesium 12/29/2022 2.1  1.7 - 2.4 mg/dL Final   Performed at [facility], [address], [place], [place]  [address]   Alcohol, Ethyl (B) 12/29/2022 <10  <10 mg/dL Final   Comment: (NOTE) Lowest detectable limit for serum alcohol is 10 mg/dL.  For medical purposes only. Performed at [facility], [address] [place], [place] [address]    Cholesterol 12/29/2022 213 (H)  0 - 200 mg/dL Final   Triglycerides 66/44/0347 238 (H)  <150 mg/dL Final   HDL 42/59/5638 40 (L)  >40 mg/dL Final   Total CHOL/HDL Ratio 12/29/2022 5.3  RATIO Final   VLDL 12/29/2022 48 (H)  0 - 40 mg/dL Final   LDL Cholesterol 12/29/2022 125 (H)  0 - 99 mg/dL Final   Comment:        Total Cholesterol/HDL:CHD Risk Coronary Heart Disease Risk Table                     Men   Women  1/2 Average Risk   3.4   3.3  Average Risk       5.0   4.4  2 X Average Risk   9.6   7.1  3 X Average Risk  23.4   11.0        Use the calculated Patient Ratio above and the CHD Risk Table to determine the patient's CHD Risk.        ATP III CLASSIFICATION (LDL):  <100     mg/dL   Optimal  756-433  mg/dL   Near or Above                    Optimal  130-159  mg/dL   Borderline  295-188  mg/dL   High  >416     mg/dL   Very High Performed at [facility], [address], [place], [place] [address]    TSH 12/29/2022 0.933  0.350 - 4.500 uIU/mL Final   Comment: Performed by a 3rd Generation assay with a functional sensitivity of <=0.01 uIU/mL. Performed at [facility], [address] [place], [place] [address]    RPR Ser Ql 12/29/2022 NON REACTIVE  NON REACTIVE Final   Performed at [facility], [address] [place], [place]  [address]   Color, Urine 12/30/2022 YELLOW  YELLOW Final   APPearance 12/30/2022 CLEAR  CLEAR Final   Specific Gravity, Urine 12/30/2022 1.017  1.005 - 1.030 Final   pH 12/30/2022 7.0  5.0 - 8.0 Final   Glucose, UA 12/30/2022 NEGATIVE  NEGATIVE mg/dL Final   Hgb urine dipstick 12/30/2022 NEGATIVE  NEGATIVE Final   Bilirubin Urine 12/30/2022 NEGATIVE  NEGATIVE Final   Ketones, ur 12/30/2022  NEGATIVE  NEGATIVE mg/dL Final   Protein, ur 19/14/7829 NEGATIVE  NEGATIVE mg/dL Final   Nitrite 56/21/3086 NEGATIVE  NEGATIVE Final   Leukocytes,Ua 12/30/2022 NEGATIVE  NEGATIVE Final   Performed at [facility], [address], [place], [place] [address]   POC Amphetamine UR 12/29/2022 None Detected  NONE DETECTED (Cut Off Level 1000 ng/mL) Final   POC Secobarbital (BAR) 12/29/2022 None Detected  NONE DETECTED (Cut Off Level 300 ng/mL) Final   POC Buprenorphine (BUP) 12/29/2022 None Detected  NONE DETECTED (Cut Off Level 10 ng/mL) Final   POC Oxazepam (BZO) 12/29/2022 None Detected  NONE DETECTED (Cut Off Level 300 ng/mL) Final   POC Cocaine UR 12/29/2022 Positive (A)  NONE DETECTED (Cut Off Level 300 ng/mL) Final   POC Methamphetamine UR 12/29/2022 None Detected (A)  NONE DETECTED (Cut Off Level 1000 ng/mL) Final   POC Morphine 12/29/2022 None Detected  NONE DETECTED (Cut Off Level 300 ng/mL) Final   POC Methadone UR 12/29/2022 None Detected  NONE DETECTED (Cut Off Level 300 ng/mL) Final   POC Oxycodone UR 12/29/2022 None Detected  NONE DETECTED (Cut Off Level 100 ng/mL) Final   POC Marijuana UR 12/29/2022 Positive (A)  NONE DETECTED (Cut Off Level 50 ng/mL) Final  Admission on 12/20/2022, Discharged on 12/20/2022  Component Date Value Ref Range Status   Sodium 12/20/2022 139  135 - 145 mmol/L Final   Potassium 12/20/2022 3.8  3.5 - 5.1 mmol/L Final   Chloride 12/20/2022 106  98 - 111 mmol/L Final   CO2 12/20/2022 26  22 - 32 mmol/L Final   Glucose, Bld 12/20/2022 88  70 - 99 mg/dL Final   Glucose reference range applies only to samples taken after fasting for at least 8 hours.   BUN 12/20/2022 16  6 - 20 mg/dL Final   Creatinine, Ser 12/20/2022 1.15  0.61 - 1.24 mg/dL Final   Calcium 96/29/5284 8.3 (L)  8.9 - 10.3 mg/dL Final   GFR, Estimated 12/20/2022 >60  >60 mL/min Final   Comment: (NOTE) Calculated using the CKD-EPI Creatinine Equation (2021)    Anion gap 12/20/2022 7  5  - 15 Final   Performed at [facility], [address] [place], [place] [address]   WBC 12/20/2022 9.3  4.0 - 10.5 K/uL Final   RBC 12/20/2022 4.56  4.22 - 5.81 MIL/uL Final   Hemoglobin 12/20/2022 14.3  13.0 - 17.0 g/dL Final   HCT 04/04/7251 40.7  39.0 - 52.0 % Final   MCV 12/20/2022 89.3  80.0 - 100.0 fL Final   MCH 12/20/2022 31.4  26.0 - 34.0 pg Final   MCHC 12/20/2022 35.1  30.0 - 36.0 g/dL Final   RDW 66/44/0347 13.5  11.5 - 15.5 % Final   Platelets 12/20/2022 254  150 - 400 K/uL Final   nRBC 12/20/2022 0.0  0.0 - 0.2 % Final   Performed at [facility], [address], [place], [place] [address]   Troponin I (High Sensitivity) 12/20/2022 3  <18 ng/L Final   Comment: (  NOTE) Elevated high sensitivity troponin I (hsTnI) values and significant  changes across serial measurements may suggest ACS but many other  chronic and acute conditions are known to elevate hsTnI results.  Refer to the "Links" section for chest pain algorithms and additional  guidance. Performed at [facility], [address] [place], [place] [address]    Opiates 12/20/2022 NONE DETECTED  NONE DETECTED Final   Cocaine 12/20/2022 POSITIVE (A)  NONE DETECTED Final   Benzodiazepines 12/20/2022 NONE DETECTED  NONE DETECTED Final   Amphetamines 12/20/2022 NONE DETECTED  NONE DETECTED Final   Tetrahydrocannabinol 12/20/2022 POSITIVE (A)  NONE DETECTED Final   Barbiturates 12/20/2022 NONE DETECTED  NONE DETECTED Final   Comment: (NOTE) DRUG SCREEN FOR MEDICAL PURPOSES ONLY.  IF CONFIRMATION IS NEEDED FOR ANY PURPOSE, NOTIFY LAB WITHIN 5 DAYS.  LOWEST DETECTABLE LIMITS FOR URINE DRUG SCREEN Drug Class                     Cutoff (ng/mL) Amphetamine and metabolites    1000 Barbiturate and metabolites    200 Benzodiazepine                 200 Opiates and metabolites        300 Cocaine and metabolites        300 THC                             50 Performed at [facility], [address], [place], [place] [address]   Admission on 11/17/2022, Discharged on 11/21/2022  Component Date Value Ref Range Status   Cholesterol 11/20/2022 171  0 - 200 mg/dL Final   Triglycerides 65/78/4696 192 (H)  <150 mg/dL Final   HDL 29/52/8413 37 (L)  >40 mg/dL Final   Total CHOL/HDL Ratio 11/20/2022 4.6  RATIO Final   VLDL 11/20/2022 38  0 - 40 mg/dL Final   LDL Cholesterol 11/20/2022 96  0 - 99 mg/dL Final   Comment:        Total Cholesterol/HDL:CHD Risk Coronary Heart Disease Risk Table                     Men   Women  1/2 Average Risk   3.4   3.3  Average Risk       5.0   4.4  2 X Average Risk   9.6   7.1  3 X Average Risk  23.4   11.0        Use the calculated Patient Ratio above and the CHD Risk Table to determine the patient's CHD Risk.        ATP III CLASSIFICATION (LDL):  <100     mg/dL   Optimal  244-010  mg/dL   Near or Above                    Optimal  130-159  mg/dL   Borderline  272-536  mg/dL   High  >644     mg/dL   Very High Performed at [facility], [address], [place], [place] [address]    Hgb A1c MFr Bld 11/20/2022 5.5  4.8 - 5.6 % Final   Comment: (NOTE) Pre diabetes:          5.7%-6.4%  Diabetes:              >6.4%  Glycemic control for   <  7.0% adults with diabetes    Mean Plasma Glucose 11/20/2022 111.15  mg/dL Final   Performed at [facility], [address] [place], [place] [address]   TSH 11/20/2022 1.248  0.350 - 4.500 uIU/mL Final   Comment: Performed by a 3rd Generation assay with a functional sensitivity of <=0.01 uIU/mL. Performed at [facility], [address] [place], [place] [address]    Vit D, 25-Hydroxy 11/20/2022 26.84 (L)  30 - 100 ng/mL Final   Comment: (NOTE) Vitamin D deficiency has been defined by the Institute of Medicine  and an Endocrine Society practice guideline as a level of serum 25-OH  vitamin D less  than 20 ng/mL (1,2). The Endocrine Society went on to  further define vitamin D insufficiency as a level between 21 and 29  ng/mL (2).  1. IOM (Institute of Medicine). 2010. Dietary reference intakes for  calcium and D. [place]: [employer]. 2. Holick MF, Binkley [place], Bischoff-Ferrari HA, et al. Evaluation,  treatment, and prevention of vitamin D deficiency: an Endocrine  Society clinical practice guideline, JCEM. 2011 Jul; 96(7): 1911-30.  Performed at [facility], [address] [place], [place] [address]   Admission on 11/17/2022, Discharged on 11/17/2022  Component Date Value Ref Range Status   Sodium 11/17/2022 138  135 - 145 mmol/L Final   Potassium 11/17/2022 3.5  3.5 - 5.1 mmol/L Final   Chloride 11/17/2022 103  98 - 111 mmol/L Final   CO2 11/17/2022 22  22 - 32 mmol/L Final   Glucose, Bld 11/17/2022 85  70 - 99 mg/dL Final   Glucose reference range applies only to samples taken after fasting for at least 8 hours.   BUN 11/17/2022 8  6 - 20 mg/dL Final   Creatinine, Ser 11/17/2022 1.36 (H)  0.61 - 1.24 mg/dL Final   Calcium 21/30/8657 9.0  8.9 - 10.3 mg/dL Final   Total Protein 84/69/6295 6.8  6.5 - 8.1 g/dL Final   Albumin 28/41/3244 4.1  3.5 - 5.0 g/dL Final   AST 04/04/7251 23  15 - 41 U/L Final   ALT 11/17/2022 18  0 - 44 U/L Final   Alkaline Phosphatase 11/17/2022 30 (L)  38 - 126 U/L Final   Total Bilirubin 11/17/2022 0.4  0.3 - 1.2 mg/dL Final   GFR, Estimated 11/17/2022 >60  >60 mL/min Final   Comment: (NOTE) Calculated using the CKD-EPI Creatinine Equation (2021)    Anion gap 11/17/2022 13  5 - 15 Final   Performed at [facility], [address] [place], [place] [address]   Alcohol, Ethyl (B) 11/17/2022 39 (H)  <10 mg/dL Final   Comment: (NOTE) Lowest detectable limit for serum alcohol is 10 mg/dL.  For medical purposes only. Performed at [facility], [address], [place], [place] [address]    Salicylate Lvl  11/17/2022 <7.0 (L)  7.0 - 30.0 mg/dL Final   Performed at [facility], [address], [place], [place] [address]   Acetaminophen (Tylenol), Serum 11/17/2022 <10 (L)  10 - 30 ug/mL Final   Comment: (NOTE) Therapeutic concentrations vary significantly. A range of 10-30 ug/mL  may be an effective concentration for many patients. However, some  are best treated at concentrations outside of this range. Acetaminophen concentrations >150 ug/mL at 4 hours after ingestion  and >50 ug/mL at 12 hours after ingestion are often associated with  toxic reactions.  Performed at [facility] [place]  [facility], [address], [place], [place] [address]    WBC 11/17/2022 9.6  4.0 - 10.5 K/uL Final   RBC 11/17/2022 5.20  4.22 - 5.81 MIL/uL Final   Hemoglobin 11/17/2022 15.9  13.0 - 17.0 g/dL Final   HCT 60/45/4098 46.3  39.0 - 52.0 % Final   MCV 11/17/2022 89.0  80.0 - 100.0 fL Final   MCH 11/17/2022 30.6  26.0 - 34.0 pg Final   MCHC 11/17/2022 34.3  30.0 - 36.0 g/dL Final   RDW 11/91/4782 13.3  11.5 - 15.5 % Final   Platelets 11/17/2022 307  150 - 400 K/uL Final   nRBC 11/17/2022 0.0  0.0 - 0.2 % Final   Performed at [facility], [address] [place], [place] [address]   Opiates 11/17/2022 NONE DETECTED  NONE DETECTED Final   Cocaine 11/17/2022 POSITIVE (A)  NONE DETECTED Final   Benzodiazepines 11/17/2022 NONE DETECTED  NONE DETECTED Final   Amphetamines 11/17/2022 NONE DETECTED  NONE DETECTED Final   Tetrahydrocannabinol 11/17/2022 POSITIVE (A)  NONE DETECTED Final   Barbiturates 11/17/2022 NONE DETECTED  NONE DETECTED Final   Comment: (NOTE) DRUG SCREEN FOR MEDICAL PURPOSES ONLY.  IF CONFIRMATION IS NEEDED FOR ANY PURPOSE, NOTIFY LAB WITHIN 5 DAYS.  LOWEST DETECTABLE LIMITS FOR URINE DRUG SCREEN Drug Class                     Cutoff (ng/mL) Amphetamine and metabolites    1000 Barbiturate and metabolites    200 Benzodiazepine                 200 Opiates and metabolites         300 Cocaine and metabolites        300 THC                            50 Performed at [facility], [address] [place], [place] [address]   Admission on 08/16/2022, Discharged on 08/16/2022  Component Date Value Ref Range Status   Sodium 08/16/2022 135  135 - 145 mmol/L Final   Potassium 08/16/2022 3.8  3.5 - 5.1 mmol/L Final   Chloride 08/16/2022 106  98 - 111 mmol/L Final   CO2 08/16/2022 22  22 - 32 mmol/L Final   Glucose, Bld 08/16/2022 140 (H)  70 - 99 mg/dL Final   Glucose reference range applies only to samples taken after fasting for at least 8 hours.   BUN 08/16/2022 13  6 - 20 mg/dL Final   Creatinine, Ser 08/16/2022 1.05  0.61 - 1.24 mg/dL Final   Calcium 78/46/9629 8.4 (L)  8.9 - 10.3 mg/dL Final   GFR, Estimated 08/16/2022 >60  >60 mL/min Final   Comment: (NOTE) Calculated using the CKD-EPI Creatinine Equation (2021)    Anion gap 08/16/2022 7  5 - 15 Final   Performed at [facility], [address] [place], [place] [address]   WBC 08/16/2022 8.7  4.0 - 10.5 K/uL Final   RBC 08/16/2022 5.02  4.22 - 5.81 MIL/uL Final   Hemoglobin 08/16/2022 15.5  13.0 - 17.0 g/dL Final   HCT 32/44/0102 45.2  39.0 - 52.0 % Final   MCV 08/16/2022 90.0  80.0 - 100.0 fL Final   MCH 08/16/2022 30.9  26.0 - 34.0 pg Final   MCHC 08/16/2022 34.3  30.0 - 36.0 g/dL Final   RDW  08/16/2022 13.7  11.5 - 15.5 % Final   Platelets 08/16/2022 271  150 - 400 K/uL Final   nRBC 08/16/2022 0.0  0.0 - 0.2 % Final   Performed at [facility], [address] [place], [place] [address]   Troponin I (High Sensitivity) 08/16/2022 <2  <18 ng/L Final   Comment: (NOTE) Elevated high sensitivity troponin I (hsTnI) values and significant  changes across serial measurements may suggest ACS but many other  chronic and acute conditions are known to elevate hsTnI results.  Refer to the "Links" section for chest pain algorithms and additional  guidance. Performed at  [facility], [address], [place], [place] [address]   Admission on 07/30/2022, Discharged on 07/31/2022  Component Date Value Ref Range Status   WBC 07/30/2022 11.1 (H)  4.0 - 10.5 K/uL Final   RBC 07/30/2022 4.60  4.22 - 5.81 MIL/uL Final   Hemoglobin 07/30/2022 14.3  13.0 - 17.0 g/dL Final   HCT 09/81/1914 40.5  39.0 - 52.0 % Final   MCV 07/30/2022 88.0  80.0 - 100.0 fL Final   MCH 07/30/2022 31.1  26.0 - 34.0 pg Final   MCHC 07/30/2022 35.3  30.0 - 36.0 g/dL Final   RDW 78/29/5621 14.0  11.5 - 15.5 % Final   Platelets 07/30/2022 380  150 - 400 K/uL Final   nRBC 07/30/2022 0.0  0.0 - 0.2 % Final   Neutrophils Relative % 07/30/2022 56  % Final   Neutro Abs 07/30/2022 6.3  1.7 - 7.7 K/uL Final   Lymphocytes Relative 07/30/2022 26  % Final   Lymphs Abs 07/30/2022 2.9  0.7 - 4.0 K/uL Final   Monocytes Relative 07/30/2022 10  % Final   Monocytes Absolute 07/30/2022 1.1 (H)  0.1 - 1.0 K/uL Final   Eosinophils Relative 07/30/2022 6  % Final   Eosinophils Absolute 07/30/2022 0.7 (H)  0.0 - 0.5 K/uL Final   Basophils Relative 07/30/2022 1  % Final   Basophils Absolute 07/30/2022 0.1  0.0 - 0.1 K/uL Final   Immature Granulocytes 07/30/2022 1  % Final   Abs Immature Granulocytes 07/30/2022 0.05  0.00 - 0.07 K/uL Final   Performed at [facility], [address], [place], [place] [address]   Sodium 07/30/2022 138  135 - 145 mmol/L Final   Potassium 07/30/2022 4.0  3.5 - 5.1 mmol/L Final   Chloride 07/30/2022 105  98 - 111 mmol/L Final   CO2 07/30/2022 26  22 - 32 mmol/L Final   Glucose, Bld 07/30/2022 94  70 - 99 mg/dL Final   Glucose reference range applies only to samples taken after fasting for at least 8 hours.   BUN 07/30/2022 13  6 - 20 mg/dL Final   Creatinine, Ser 07/30/2022 1.15  0.61 - 1.24 mg/dL Final   Calcium 78/46/9629 9.3  8.9 - 10.3 mg/dL Final   Total Protein 52/84/1324 6.0 (L)  6.5 - 8.1 g/dL Final   Albumin 40/01/2724 3.5  3.5 - 5.0 g/dL  Final   AST 36/64/4034 19  15 - 41 U/L Final   ALT 07/30/2022 15  0 - 44 U/L Final   Alkaline Phosphatase 07/30/2022 44  38 - 126 U/L Final   Total Bilirubin 07/30/2022 0.3  0.3 - 1.2 mg/dL Final   GFR, Estimated 07/30/2022 >60  >60 mL/min Final   Comment: (NOTE) Calculated using the CKD-EPI Creatinine Equation (2021)    Anion gap 07/30/2022 7  5 -  15 Final   Performed at [facility], [address], [place], [place] [address]   Hgb A1c MFr Bld 07/30/2022 5.7 (H)  4.8 - 5.6 % Final   Comment: (NOTE) Pre diabetes:          5.7%-6.4%  Diabetes:              >6.4%  Glycemic control for   <7.0% adults with diabetes    Mean Plasma Glucose 07/30/2022 116.89  mg/dL Final   Performed at [facility], [address], [place], [place] [address]   Alcohol, Ethyl (B) 07/30/2022 <10  <10 mg/dL Final   Comment: (NOTE) Lowest detectable limit for serum alcohol is 10 mg/dL.  For medical purposes only. Performed at [facility], [address] [place], [place] [address]    Cholesterol 07/30/2022 193  0 - 200 mg/dL Final   Triglycerides 21/30/8657 232 (H)  <150 mg/dL Final   HDL 84/69/6295 37 (L)  >40 mg/dL Final   Total CHOL/HDL Ratio 07/30/2022 5.2  RATIO Final   VLDL 07/30/2022 46 (H)  0 - 40 mg/dL Final   LDL Cholesterol 07/30/2022 110 (H)  0 - 99 mg/dL Final   Comment:        Total Cholesterol/HDL:CHD Risk Coronary Heart Disease Risk Table                     Men   Women  1/2 Average Risk   3.4   3.3  Average Risk       5.0   4.4  2 X Average Risk   9.6   7.1  3 X Average Risk  23.4   11.0        Use the calculated Patient Ratio above and the CHD Risk Table to determine the patient's CHD Risk.        ATP III CLASSIFICATION (LDL):  <100     mg/dL   Optimal  284-132  mg/dL   Near or Above                    Optimal  130-159  mg/dL   Borderline  440-102  mg/dL   High  >725     mg/dL   Very High Performed at [facility], [address],  [place], [place] [address]    POC Amphetamine UR 07/30/2022 None Detected  NONE DETECTED (Cut Off Level 1000 ng/mL) Final   POC Secobarbital (BAR) 07/30/2022 None Detected  NONE DETECTED (Cut Off Level 300 ng/mL) Final   POC Buprenorphine (BUP) 07/30/2022 None Detected  NONE DETECTED (Cut Off Level 10 ng/mL) Final   POC Oxazepam (BZO) 07/30/2022 None Detected  NONE DETECTED (Cut Off Level 300 ng/mL) Final   POC Cocaine UR 07/30/2022 Positive (A)  NONE DETECTED (Cut Off Level 300 ng/mL) Final   POC Methamphetamine UR 07/30/2022 None Detected  NONE DETECTED (Cut Off Level 1000 ng/mL) Final   POC Morphine 07/30/2022 None Detected  NONE DETECTED (Cut Off Level 300 ng/mL) Final   POC Methadone UR 07/30/2022 None Detected  NONE DETECTED (Cut Off Level 300 ng/mL) Final   POC Oxycodone UR 07/30/2022 None Detected  NONE DETECTED (Cut Off Level 100 ng/mL) Final   POC Marijuana UR 07/30/2022 Positive (A)  NONE DETECTED (Cut Off Level 50 ng/mL) Final   TSH 07/30/2022 0.973  0.350 - 4.500 uIU/mL Final   Comment: Performed by a 3rd Generation assay with a  functional sensitivity of <=0.01 uIU/mL. Performed at [facility], [address] [place], [place] [address]     Blood Alcohol level:  Lab Results  Component Value Date   ETH <10 12/29/2022   ETH 39 (H) 11/17/2022    Metabolic Disorder Labs: Lab Results  Component Value Date   HGBA1C 5.5 11/20/2022   MPG 111.15 11/20/2022   MPG 116.89 07/30/2022   No results found for: "PROLACTIN" Lab Results  Component Value Date   CHOL 213 (H) 12/29/2022   TRIG 238 (H) 12/29/2022   HDL 40 (L) 12/29/2022   CHOLHDL 5.3 12/29/2022   VLDL 48 (H) 12/29/2022   LDLCALC 125 (H) 12/29/2022   LDLCALC 96 11/20/2022    Therapeutic Lab Levels: No results found for: "LITHIUM" Lab Results  Component Value Date   VALPROATE 61 05/26/2017   VALPROATE 40 (L) 08/31/2015   No results found for: "CBMZ"  Physical Findings   AIMS    Flowsheet Row [facility]) [facility] Ad[facility] (Dis[facility]  AIMS Total Score 0 0 0      AUDIT    Flowsheet Row [facility]) [facility] Ad[facility] (Discharged) [facility] Ad[facility] (Discharged[facility] Ad[facility] ([facility]) [facility]  Alcohol Use Disorder Identification Test Final Score (AUDIT) 4 3 5 1       PHQ2-9    Flowsheet Row ED from 12/31/2022 in [facility] ED from 12/29/2022 in [facility] ED from 07/30/2022 in [facility]  PHQ-2 Total Score 0 2 2  PHQ-9 Total Score 0 15 3      Flowsheet Row ED from 12/31/2022 in [facility] ED from 12/29/2022 in [facility] ED from 12/20/2022 in [facility]  C-SSRS RISK CATEGORY No Risk High Risk No Risk        Musculoskeletal  Strength & Muscle Tone: within normal limits Gait & Station: normal Patient leans: N/A  Psychiatric Specialty Exam  Presentation  General Appearance:  Appropriate for Environment  Eye Contact: Good  Speech: Clear and Coherent; Normal Rate  Speech Volume: Normal  Handedness: -- (Not assessed)   Mood and Affect  Mood: -- ("Wonderful")  Affect: Congruent; Full Range   Thought Process  Thought Processes: Linear  Descriptions of Associations:Intact  Orientation:-- (Not assessed)  Thought Content:Logical  Diagnosis of Schizophrenia or Schizoaffective disorder in past: No     Hallucinations:Hallucinations: None  Ideas of Reference:None  Suicidal Thoughts:Suicidal Thoughts: No  Homicidal Thoughts:Homicidal Thoughts: No   Sensorium  Memory: Immediate Good; Recent Good; Remote Good  Judgment: Fair  Insight: Fair   Art therapist  Concentration: Good  Attention Span: Good  Recall: Good  Fund of Knowledge: Good  Language: Good   Psychomotor Activity  Psychomotor Activity: Psychomotor Activity: Normal   Assets  Assets: Desire for Improvement; Resilience; Communication Skills   Sleep  Sleep: Sleep: Good   No data recorded   Physical Exam  Physical Exam Vitals and nursing note reviewed.  Constitutional:      General: He is not in acute distress.    Appearance: He is not  ill-appearing.  HENT:     Head: Normocephalic and atraumatic.  Pulmonary:     Effort: Pulmonary effort is normal. No respiratory distress.  Skin:    General: Skin is warm and dry.  Neurological:     General: No focal deficit present.    Review of Systems  All other systems reviewed and are negative.  Blood pressure 112/82, pulse 78, temperature 97.7 F (36.5 C), temperature source Oral, resp. rate 18, SpO2 98%. There is no height or weight on file to calculate BMI.  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) Tobacco use disorder Cannabis use disorder  Alcohol use disorder     Alcohol use disorder  -VSS - EtOH negative on admission -Continue to monitor   Cannabis use disorder   Cocaine use disorder -Continue to monitor   MDD vs substance-induced mood disorder  GAD -Continue Zoloft 50 mg, changed to nightly dose   Tobacco use disorder  -Encourage smoking cessation - Continue nicotine patch 21 mg   Other PRNs:   Trazodone 50 mg nightly as needed Tylenol 650 mg every 6 hours as needed Maalox Mylanta every 4 hours as needed Atarax 25 mg 3 times daily as needed Milk of magnesia suspension daily as needed     Labs/imaging reviewed: CMP showing ALT 37 unremarkable;CBC unremarkable; magnesium WNL; ethanol <10; lipid panel (not fasting) showing cholesterol 213, TGs 238, HDL 40, VLDL 48, LDL 125; TSH WNL; RPR nonreactive; UDS positive for cocaine and THC; UA unremarkable EKG on 12/30/2022: QTc 395     Recommendations  Based on my evaluation the patient does not appear to have an emergency medical condition.    Disposition: [facility], can be discharged at 9 AM on Fri 01/04/2023.   Signed: Lorri Frederick, MD 01/03/2023 10:33 AM

## 2023-01-03 NOTE — ED Notes (Signed)
Patient A&Ox4. He is calm and cooperative.Denies intent to harm self/others when asked. Denies SI or A/VH. Patient denies any physical complaints at this moment. He states sleep was "ok" last night. No acute distress observed. Routine safety checks conducted according to facility protocol. Patient agreed to notify staff if thoughts of harm toward self or others arise. We will continue to monitor for safety.

## 2023-01-03 NOTE — ED Notes (Signed)
Pt is in the dayroom watching TV with peers. Pt denies SI/HI/AVH. No acute distress noted. Will continue to monitor for safety. 

## 2023-01-03 NOTE — Group Note (Signed)
Group Topic: Balance in Life  Group Date: 01/03/2023 Start Time: 2000 End Time: 2030 Facilitators: Guss Bunde  Department: [facility]  Number of Participants: 5  Group Focus: affirmation Treatment Modality:  Psychoeducation Interventions utilized were group exercise Purpose: reinforce self-care  Name: Jonathan [name] Date of Birth: February 09, 1996  MR: [id number]    Level of Participation: active Quality of Participation: cooperative Interactions with others: gave feedback Mood/Affect: appropriate Triggers (if applicable):  Cognition: goal directed Progress: Gaining insight Response: Pt wants to continue treatment at [facility] Plan:   Patients Problems:  Patient Active Problem List   Diagnosis Date Noted   Substance induced mood disorder (HCC) 12/31/2022   Polysubstance abuse (HCC) 12/31/2022   Mild alcohol use disorder 11/18/2022   Insomnia 11/18/2022   MDD (major depressive disorder), recurrent episode (HCC) 07/30/2022   Alcohol use disorder 01/20/2022   Delta-9-tetrahydrocannabinol (THC) dependence (HCC) 01/20/2022   Cocaine use disorder (HCC) 01/20/2022   Pneumonia 05/23/2017   MDD (major depressive disorder), recurrent severe, without psychosis (HCC) 05/22/2017

## 2023-01-03 NOTE — Group Note (Signed)
Group Topic: Relapse and Recovery  Group Date: 01/03/2023 Start Time: 1000 End Time: 1030 Facilitators: Merlene Morse, [place]  Department: [facility]  Number of Participants: 5  Group Focus: relapse prevention Treatment Modality:  Behavior Modification Therapy Interventions utilized were group exercise Purpose: express feelings  Name: Efosa Treichler Date of Birth: 1995/05/09  MR: [id number]    Level of Participation: active Quality of Participation: attentive and cooperative Interactions with others: gave feedback Mood/Affect: appropriate Triggers (if applicable): N/A Cognition: coherent/clear Progress: Moderate Response: N/A Plan: patient will be encouraged to continue active participation in treatment.     Patients Problems:  Patient Active Problem List   Diagnosis Date Noted   Substance induced mood disorder (HCC) 12/31/2022   Polysubstance abuse (HCC) 12/31/2022   Mild alcohol use disorder 11/18/2022   Insomnia 11/18/2022   MDD (major depressive disorder), recurrent episode (HCC) 07/30/2022   Alcohol use disorder 01/20/2022   Delta-9-tetrahydrocannabinol (THC) dependence (HCC) 01/20/2022   Cocaine use disorder (HCC) 01/20/2022   Pneumonia 05/23/2017   MDD (major depressive disorder), recurrent severe, without psychosis (HCC) 05/22/2017

## 2023-01-03 NOTE — ED Notes (Signed)
Patient received lunch

## 2023-01-03 NOTE — ED Provider Notes (Signed)
FBC/OBS ASAP Discharge Summary  Date and Time: 01/03/2023 10:38 AM  Name: Jonathan [name]  MRN:  [id number]   Discharge Diagnoses:  Final diagnoses:  Substance induced mood disorder (HCC)  Alcohol use disorder  Stimulant use disorder  Tobacco use disorder  Cannabis use disorder   Date of Discharge: Friday, 01/04/2023  Subjective:  Today patient was evaluated on the unit.  He reports sleep and appetite have been good.  He describes his mood today as "wonderful", describes feeling motivated for his discharge to [facility].  He denies any symptoms of depression or anxiety.  He reports he is benefiting from group therapy, has no concerns at this moment.   Patient will receive scheduled Zoloft as nightly dose, as he reports drowsiness when taking it in the morning.  Stated goal for today is to "continue working on myself".  On assessment today he done denies any suicidal ideations, homicidal ideations, or hallucinations.   Patient denies any side effects to currently prescribed psychiatric medications.  Denies any somatic complaints, reports regular bowel movements.  Denies any withdrawal symptoms.  Denies any cravings.  Stay Summary:   Jonathan [name] is a 27 yo male with a PPHx significant for MDD, GAD, polysubstance use (cocaine, cannabis, alcohol, tobacco), medication noncompliance, with prior psychiatric hospitalizations (last [facility] 8/17-2/22), who initially presented to [facility] voluntarily on 12/29/2022 and admitted to [facility] on 12/31/2022 requesting detox and medication management. He has multiple psychosocial stressors. His UDS is + cocaine and marijuana on admission.   During the patient's hospitalization at the [facility], patient had extensive initial psychiatric evaluation, with daily follow-up assessments focused on detoxification management.  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) Tobacco use disorder Cannabis use  disorder  Alcohol use disorder   Patient's psychiatric medications adjusted during hospitalization:   Alcohol use disorder  -VSS - EtOH negative on admission -Continue to monitor   Cannabis use disorder   Cocaine use disorder -Continue to monitor   MDD vs substance-induced mood disorder  GAD -Continue Zoloft 50 mg, changed to nightly dose   Tobacco use disorder  -Encourage smoking cessation - Continue nicotine patch 21 mg   Other PRNs:  Trazodone 50 mg nightly as needed Tylenol 650 mg every 6 hours as needed Maalox Mylanta every 4 hours as needed Atarax 25 mg 3 times daily as needed Milk of magnesia suspension daily as needed  Patient's care was discussed during the interdisciplinary team meeting every day during the hospitalization.  The patient denies having side effects to prescribed psychiatric medication.  Gradually, patient started adjusting to milieu. The patient was evaluated each day by a clinical provider to ascertain response to treatment. Improvement was noted by the patient's report of decreasing symptoms, improved sleep and appetite, affect, medication tolerance, behavior, and participation in unit programming.  Patient was asked each day to complete a self inventory noting mood, mental status, pain, new symptoms, anxiety and concerns.    Symptoms were reported as significantly decreased or resolved completely by discharge.   On day of discharge, the patient reports that their mood is stable. The patient denied having suicidal thoughts for more than 48 hours prior to discharge.  Patient denies having homicidal thoughts.  Patient denies having auditory hallucinations.  Patient denies any visual hallucinations or other symptoms of psychosis. The patient was motivated to continue taking medication with a goal of continued improvement in mental health.   The patient reported that their withdrawal symptoms and cravings responded  well to the detox regimen, with overall  benefit from the detox program. Supportive psychotherapy was provided, and the patient participated in regular group therapy sessions focused on managing cravings and withdrawal. Coping skills, problem-solving, and relaxation techniques were also part of the program's therapeutic interventions.  Labs were reviewed with the patient, and abnormal results were discussed with the patient.  The patient is able to verbalize their individual safety plan to this provider.  # It is recommended to the patient to continue psychiatric medications as prescribed, after discharge from the hospital.    # It is recommended to the patient to follow up with your outpatient psychiatric provider and PCP.  # It was discussed with the patient, the impact of alcohol, drugs, tobacco have been there overall psychiatric and medical wellbeing, and total abstinence from substance use was recommended the patient.ed.  # Prescriptions provided or sent directly to preferred pharmacy at discharge. Patient agreeable to plan. Given opportunity to ask questions. Appears to feel comfortable with discharge.    # In the event of worsening symptoms, the patient is instructed to call the crisis hotline, 911 and or go to the nearest ED for appropriate evaluation and treatment of symptoms. To follow-up with primary care provider for other medical issues, concerns and or health care needs  # Patient was discharged to [facility] with a plan to follow up as noted below.      Total Time spent with patient: 30 minutes  Substance Abuse History Alcohol: Drinks 2-3 times a week, will have up to 216 ounce beers at most.  Has been drinking since discharge in August. Tobacco: 1 PPD for several years Cannabis: Reports smoking occasionally Cocaine: Intranasal cocaine use for the past 2 years, reports spending unspecified amounts of money on a weekly basis. Periods of sobriety: Denies any. At most 1 week.  Other Illicit  drugs: Denies fentanyl, methamphetamines IV drug use: Denies   Past Psychiatric Hx: Patient's previous psychiatric diagnoses include: MDD recurrent severe, GAD, cocaine use disorder, alcohol use disorder, cannabis use disorder.   Patient was previously hospitalized at [facility] on 11/17/2022-11/22/2022 where he was started on Zoloft at that time.  Patient admits to medication noncompliance, stopped taking Zoloft 1.5 weeks after discharge from [facility]. -Patient has 3 prior psychiatric hospitalizations at [facility] Past psychiatric medication: Reports taking Celexa few years back   Past Medical History:  Dx: Denies Seizures: denies Head Trauma/LOC/Concussion: Denies   Family History: Unknown to pt   Family Psychiatric  History: Pts mother was substance user (crack cocaine). Pts father also used substances, but recovered.  Suicide Hx: Denies Violence Hx: Denies   Social History Living Situation: unstable housing, lost his apartment. Has been unhoused sometime between June-April. Reports family is not supportive, has several relatives in the area.  Sometimes stays with gf. Stays at [employer] his job after being hospitalized, lost his section 8 housing.   Occupational hx: Currently unemployed, was previously working at [employer] tables Marital Status: single Children: son 9 yo, currently with  mother in [place] Legal: Denies any prior incarcerations.  Has upcoming court date on Nov 15th for child support Access to firearms: Denies  Current Medications:  Current Facility-Administered Medications  Medication Dose Route Frequency Provider Last Rate Last Admin   acetaminophen (TYLENOL) tablet 650 mg  650 mg Oral Q6H PRN Rankin, Shuvon B, NP   650 mg at 01/03/23 0555   alum & mag hydroxide-simeth (MAALOX/MYLANTA) 200-200-20 MG/5ML suspension 30 mL  30 mL Oral Q4H PRN Rankin, Shuvon B, NP       hydrOXYzine (ATARAX) tablet 25 mg  25 mg Oral TID PRN Rankin, Shuvon B, NP       magnesium  hydroxide (MILK OF MAGNESIA) suspension 30 mL  30 mL Oral Daily PRN Rankin, Shuvon B, NP       nicotine (NICODERM CQ - dosed in mg/24 hours) patch 21 mg  21 mg Transdermal Daily PRN Carrion-Carrero, Shameka Aggarwal, MD       sertraline (ZOLOFT) tablet 50 mg  50 mg Oral QHS Carrion-Carrero, Sonda Coppens, MD       traZODone (DESYREL) tablet 50 mg  50 mg Oral QHS PRN Rankin, Shuvon B, NP   50 mg at 01/02/23 2145   Current Outpatient Medications  Medication Sig Dispense Refill   hydrOXYzine (ATARAX) 25 MG tablet Take 1 tablet (25 mg total) by mouth every 8 (eight) hours as needed for anxiety. (Patient not taking: Reported on 12/30/2022) 30 tablet 0   naproxen (NAPROSYN) 500 MG tablet Take 1 tablet (500 mg total) by mouth 2 (two) times daily. (Patient not taking: Reported on 12/30/2022) 30 tablet 0   neomycin-bacitracin-polymyxin (NEOSPORIN) OINT Apply 1 Application topically 2 (two) times daily. (Patient not taking: Reported on 12/30/2022)     nicotine (NICODERM CQ - DOSED IN MG/24 HOURS) 21 mg/24hr patch Place 1 patch (21 mg total) onto the skin daily. (Patient not taking: Reported on 12/30/2022) 28 patch 0   sertraline (ZOLOFT) 100 MG tablet Take 1 tablet (100 mg total) by mouth daily. (Patient not taking: Reported on 12/30/2022) 30 tablet 0   traZODone (DESYREL) 50 MG tablet Take 1 tablet (50 mg total) by mouth at bedtime. (Patient not taking: Reported on 12/30/2022) 30 tablet 0    PTA Medications:  Facility Ordered Medications  Medication   traZODone (DESYREL) tablet 50 mg   magnesium hydroxide (MILK OF MAGNESIA) suspension 30 mL   hydrOXYzine (ATARAX) tablet 25 mg   alum & mag hydroxide-simeth (MAALOX/MYLANTA) 200-200-20 MG/5ML suspension 30 mL   acetaminophen (TYLENOL) tablet 650 mg   [COMPLETED] nicotine (NICODERM CQ - dosed in mg/24 hours) patch 21 mg   nicotine (NICODERM CQ - dosed in mg/24 hours) patch 21 mg   sertraline (ZOLOFT) tablet 50 mg   PTA Medications  Medication Sig   hydrOXYzine (ATARAX)  25 MG tablet Take 1 tablet (25 mg total) by mouth every 8 (eight) hours as needed for anxiety. (Patient not taking: Reported on 12/30/2022)   neomycin-bacitracin-polymyxin (NEOSPORIN) OINT Apply 1 Application topically 2 (two) times daily. (Patient not taking: Reported on 12/30/2022)   nicotine (NICODERM CQ - DOSED IN MG/24 HOURS) 21 mg/24hr patch Place 1 patch (21 mg total) onto the skin daily. (Patient not taking: Reported on 12/30/2022)   sertraline (ZOLOFT) 100 MG tablet Take 1 tablet (100 mg total) by mouth daily. (Patient not taking: Reported on 12/30/2022)   traZODone (DESYREL) 50 MG tablet Take 1 tablet (50 mg total) by mouth at bedtime. (Patient not taking: Reported on 12/30/2022)   naproxen (NAPROSYN) 500 MG tablet Take 1 tablet (500 mg total) by mouth 2 (two) times daily. (Patient not taking: Reported on 12/30/2022)       01/03/2023   10:29 AM 01/01/2023    1:23 PM 12/30/2022   12:40 AM  Depression screen PHQ 2/9  Decreased Interest 0 1 1  Down, Depressed, Hopeless 0 1 1  PHQ - 2 Score 0 2 2  Altered sleeping 0 3 2  Tired, decreased energy 0 3 2  Change in appetite 0 0 2  Feeling bad or failure about yourself  0 3 2  Trouble concentrating 0 3 2  Moving slowly or fidgety/restless 0 1 1  Suicidal thoughts 0 1 2  PHQ-9 Score 0 16 15  Difficult doing work/chores Not difficult at all Somewhat difficult Very difficult    Flowsheet Row ED from 12/31/2022 in [facility] ED from 12/29/2022 in [facility] ED from 12/20/2022 in [facility] Emergency Department at [facility]  C-SSRS RISK CATEGORY No Risk High Risk No Risk       Musculoskeletal  Strength & Muscle Tone: within normal limits Gait & Station: normal Patient leans: N/A  Psychiatric Specialty Exam  Presentation  General Appearance:  Appropriate for Environment  Eye Contact: Good  Speech: Clear and Coherent; Normal Rate  Speech  Volume: Normal  Handedness: -- (Not assessed)   Mood and Affect  Mood: -- ("Wonderful")  Affect: Congruent; Full Range   Thought Process  Thought Processes: Linear  Descriptions of Associations:Intact  Orientation:-- (Not assessed)  Thought Content:Logical  Diagnosis of Schizophrenia or Schizoaffective disorder in past: No    Hallucinations:Hallucinations: None  Ideas of Reference:None  Suicidal Thoughts:Suicidal Thoughts: No  Homicidal Thoughts:Homicidal Thoughts: No   Sensorium  Memory: Immediate Good; Recent Good; Remote Good  Judgment: Fair  Insight: Fair   Art therapist  Concentration: Good  Attention Span: Good  Recall: Good  Fund of Knowledge: Good  Language: Good   Psychomotor Activity  Psychomotor Activity: Psychomotor Activity: Normal   Assets  Assets: Desire for Improvement; Resilience; Communication Skills   Sleep  Sleep: Sleep: Good   No data recorded  Physical Exam  Physical Exam Constitutional:      General: He is not in acute distress.    Appearance: He is not ill-appearing.  HENT:     Head: Normocephalic and atraumatic.  Pulmonary:     Effort: Pulmonary effort is normal. No respiratory distress.  Skin:    General: Skin is warm and dry.  Neurological:     General: No focal deficit present.    Review of Systems  All other systems reviewed and are negative.  Blood pressure 112/82, pulse 78, temperature 97.7 F (36.5 C), temperature source Oral, resp. rate 18, SpO2 98%. There is no height or weight on file to calculate BMI.  Demographic Factors:  Male and Low socioeconomic status  Loss Factors: Loss of significant relationship  Historical Factors: Family history of mental illness or substance abuse  Risk Reduction Factors:   Sense of responsibility to family, Positive social support, and Positive coping skills or problem solving skills  Continued Clinical Symptoms:  Alcohol/Substance  Abuse/Dependencies  Cognitive Features That Contribute To Risk:  None    Suicide Risk:  Mild:  Suicidal ideation of limited frequency, intensity, duration, and specificity.  There are no identifiable plans, no associated intent, mild dysphoria and related symptoms, good self-control (both objective and subjective assessment), few other risk factors, and identifiable protective factors, including available and accessible social support.  Plan Of Care/Follow-up recommendations:  Activity: as tolerated  Diet: heart healthy  Other: -Follow-up with your outpatient psychiatric provider -instructions on appointment date, time, and address (location) are provided to you in discharge paperwork.  -Take your psychiatric medications as prescribed at discharge - instructions are provided to you in the discharge paperwork  -Follow-up with outpatient primary care doctor and other specialists -for management of preventative  medicine and chronic medical disease, including: None  -Testing: Follow-up with outpatient provider for abnormal lab results: None  -Recommend abstinence from alcohol, tobacco, and other illicit drug use at discharge.   -If your psychiatric symptoms recur, worsen, or if you have side effects to your psychiatric medications, call your outpatient psychiatric provider, 911, [phone] or go to the nearest emergency department.  -If suicidal thoughts recur, call your outpatient psychiatric provider, 911, [phone] or go to the nearest emergency department.   Disposition: [facility]  Lorri Frederick, MD 01/03/2023, 10:38 AM

## 2023-01-03 NOTE — ED Notes (Signed)
Pt is currently sleeping, no distress noted, environmental check complete, will continue to monitor patient for safety.  

## 2023-01-03 NOTE — ED Notes (Signed)
Dinner provided.

## 2023-01-03 NOTE — ED Notes (Signed)
Patient observed resting quietly, eyes closed. Respirations equal and unlabored. Will continue to monitor for safety.  

## 2023-01-04 DIAGNOSIS — F1410 Cocaine abuse, uncomplicated: Secondary | ICD-10-CM | POA: Diagnosis not present

## 2023-01-04 DIAGNOSIS — F1010 Alcohol abuse, uncomplicated: Secondary | ICD-10-CM | POA: Diagnosis not present

## 2023-01-04 DIAGNOSIS — F1210 Cannabis abuse, uncomplicated: Secondary | ICD-10-CM | POA: Diagnosis not present

## 2023-01-04 DIAGNOSIS — F1914 Other psychoactive substance abuse with psychoactive substance-induced mood disorder: Secondary | ICD-10-CM | POA: Diagnosis not present

## 2023-01-04 NOTE — ED Notes (Signed)
Patient is sleeping. Respirations equal and unlabored, skin warm and dry. No change in assessment or acuity. Routine safety checks conducted according to facility protocol. Will continue to monitor for safety.   

## 2023-01-04 NOTE — ED Notes (Addendum)
Patient sitting in dayroom in no apparent acute distress. Calm and composed. Patient greeted staff while walking in hallway, pleasant demeanor. Patient denies SI/HI, denies A/VH. Denies detox symptoms. No further complaint at this time. This nurse informed patient if they need anything to inform staff. Patient agreed. Verbally contracted for safety. Environment secured. Safety checks in place according to facility policy.

## 2023-01-04 NOTE — Discharge Instructions (Signed)
Please follow up with [facility]

## 2023-01-04 NOTE — Discharge Summary (Signed)
Jonathan [name] to be D/C'd  Jonathan [name]  per MD order. Discussed with the patient and all questions fully answered. An After Visit Summary was printed and given to the patient. Medication samples and scripts were also given to patient. All belongings returned. Patient escorted out and D/C home via safe transport. Jonathan [name]  01/04/2023 9:01 AM

## 2023-01-04 NOTE — ED Notes (Signed)
Patient is sleeping. Respirations equal and unlabored, skin warm and dry, NAD. No change in assessment or acuity. Routine safety checks conducted according to facility protocol. Will continue to monitor for safety.   

## 2023-01-11 ENCOUNTER — Encounter (HOSPITAL_COMMUNITY): Payer: Self-pay

## 2023-01-15 ENCOUNTER — Ambulatory Visit (HOSPITAL_COMMUNITY): Payer: MEDICAID | Admitting: Licensed Clinical Social Worker

## 2023-02-25 ENCOUNTER — Other Ambulatory Visit: Payer: Self-pay

## 2023-02-25 ENCOUNTER — Emergency Department (HOSPITAL_COMMUNITY)
Admission: EM | Admit: 2023-02-25 | Discharge: 2023-02-25 | Disposition: A | Discharge status: Home / Self Care | Payer: MEDICAID | Attending: Emergency Medicine | Admitting: Emergency Medicine

## 2023-02-25 ENCOUNTER — Encounter (HOSPITAL_COMMUNITY): Payer: Self-pay

## 2023-02-25 DIAGNOSIS — M7918 Myalgia, other site: Secondary | ICD-10-CM | POA: Diagnosis not present

## 2023-02-25 DIAGNOSIS — J020 Streptococcal pharyngitis: Secondary | ICD-10-CM | POA: Diagnosis not present

## 2023-02-25 DIAGNOSIS — J029 Acute pharyngitis, unspecified: Secondary | ICD-10-CM | POA: Diagnosis present

## 2023-02-25 LAB — GROUP A STREP BY PCR: Group A Strep by PCR: DETECTED — AB

## 2023-02-25 MED ORDER — AMOXICILLIN-POT CLAVULANATE 875-125 MG PO TABS: 1.0000 | ORAL_TABLET | Freq: Two times a day (BID) | ORAL | 0 refills | Status: AC

## 2023-02-25 NOTE — ED Provider Notes (Signed)
[place] EMERGENCY DEPARTMENT AT [facility] Provider Note   CSN: [id number] Arrival date & time: 02/25/23  1051     History  Chief Complaint  Patient presents with   Sore Throat    Jonathan [name] is a 27 y.o. male who presents with concern for sore throat, body aches, headache for the past 2 weeks.  Reports he has been around his girlfriend who was diagnosed with bronchitis recently.  He reports mild cough, but no shortness of breath. Reports he had strep throat earlier this year in April and was treated with penicillin.   Sore Throat Pertinent negatives include no shortness of breath.       Home Medications Prior to Admission medications   Medication Sig Start Date End Date Taking? Authorizing Provider  amoxicillin-clavulanate (AUGMENTIN) 875-125 MG tablet Take 1 tablet by mouth every 12 (twelve) hours for 10 days. 02/25/23 03/07/23 Yes Arabella Merles, PA-C  hydrOXYzine (ATARAX) 25 MG tablet Take 1 tablet (25 mg total) by mouth 3 (three) times daily as needed for anxiety. 01/03/23   Carrion-Carrero, Karle Starch, MD  nicotine (NICODERM CQ - DOSED IN MG/24 HOURS) 21 mg/24hr patch Place 1 patch (21 mg total) onto the skin daily as needed (tobacco use, cravings). 01/03/23   Carrion-Carrero, Karle Starch, MD  sertraline (ZOLOFT) 50 MG tablet Take 1 tablet (50 mg total) by mouth at bedtime. 01/03/23 02/02/23  Carrion-Carrero, Karle Starch, MD  traZODone (DESYREL) 50 MG tablet Take 1 tablet (50 mg total) by mouth at bedtime as needed for sleep. 01/03/23   Carrion-Carrero, Karle Starch, MD      Allergies    Patient has no known allergies.    Review of Systems   Review of Systems  Constitutional:  Negative for fever.  HENT:  Positive for sore throat.   Respiratory:  Negative for shortness of breath.     Physical Exam Updated Vital Signs BP 110/68   Pulse 80   Temp 98 F (36.7 C)   Resp 16   Wt 78 kg   SpO2 99%   BMI 26.15 kg/m  Physical Exam Vitals and nursing note reviewed.   Constitutional:      Appearance: Normal appearance.     Comments: Patient talking without difficulty, handling secretions.  Breathing comfortably on room air  HENT:     Head: Atraumatic.     Right Ear: Tympanic membrane, ear canal and external ear normal.     Left Ear: Tympanic membrane, ear canal and external ear normal.     Mouth/Throat:     Comments: Erythematous enlarged 2+ tonsils bilaterally.  No tonsillar exudate or abscess Pulmonary:     Effort: Pulmonary effort is normal.     Comments: Lungs clear to auscultation bilaterally Lymphadenopathy:     Cervical: Cervical adenopathy present.  Neurological:     General: No focal deficit present.     Mental Status: He is alert.  Psychiatric:        Mood and Affect: Mood normal.        Behavior: Behavior normal.     ED Results / Procedures / Treatments   Labs (all labs ordered are listed, but only abnormal results are displayed) Labs Reviewed  GROUP A STREP BY PCR - Abnormal; Notable for the following components:      Result Value   Group A Strep by PCR DETECTED (*)    All other components within normal limits    EKG None  Radiology No results found.  Procedures Procedures  Medications Ordered in ED Medications - No data to display  ED Course/ Medical Decision Making/ A&P                                 Medical Decision Making    Differential diagnosis includes but is not limited to strep pharyngitis, viral pharyngitis, viral URI, strep, COVID, peritonsillar abscess  ED Course:  Patient overall well-appearing, no acute distress.  Vital signs stable.  His strep test was positive today.  Suspect strep pharyngitis given his report of sore throat.  He also reports mild cough, however, given no vital sign abnormalities, lungs clear to auscultation, low suspicion for pneumonia at this time.  Do not feel chest x-ray is needed at this time.  However, discussed with patient that he should return if he has worsening  cough, fevers, if this would be concerning for pneumonia.  He may have overlying viral URI with his strep given report of cough and sneezing as well.  Will treat with 10-day course of Augmentin.   Impression: Strep pharyngitis  Disposition:  The patient was discharged home with instructions to take 10 day course of Augmentin. Return precautions given.  Lab Tests: I Ordered, and personally interpreted labs.  The pertinent results include:   Strep positive              Final Clinical Impression(s) / ED Diagnoses Final diagnoses:  Strep pharyngitis    Rx / DC Orders ED Discharge Orders          Ordered    amoxicillin-clavulanate (AUGMENTIN) 875-125 MG tablet  Every 12 hours        02/25/23 1253              Arabella Merles, PA-C 02/25/23 1300    Lorre Nick, MD 02/27/23 [phone]

## 2023-02-25 NOTE — ED Triage Notes (Signed)
C/o bodyaches, sore throat, productive cough, sneezing, and headache x2 weeks.

## 2023-02-25 NOTE — Discharge Instructions (Addendum)
You tested positive for strep throat today.  You have been prescribed Augmentin. Take this antibiotic 2 times a day for the next 10 days. Take the full course of your antibiotic even if you start feeling better. Antibiotics may cause you to have diarrhea.  You may take up to 1000mg  of tylenol every 6 hours as needed for pain.  Do not take more then 4g per day.  Return to the ER if you have fever not controlled Tylenol, new rashes, any other new or concerning symptoms.

## 2023-05-21 ENCOUNTER — Inpatient Hospital Stay (HOSPITAL_COMMUNITY)
Admission: AD | Admit: 2023-05-21 | Discharge: 2023-05-28 | DRG: 897 | Payer: MEDICAID | Source: Intra-hospital | Attending: Psychiatry | Admitting: Psychiatry

## 2023-05-21 ENCOUNTER — Other Ambulatory Visit: Payer: Self-pay

## 2023-05-21 ENCOUNTER — Ambulatory Visit (HOSPITAL_COMMUNITY)
Admission: EM | Admit: 2023-05-21 | Discharge: 2023-05-21 | Disposition: A | Discharge status: Other Acute Inpatient Hospital | Payer: MEDICAID | Attending: Psychiatry | Admitting: Psychiatry

## 2023-05-21 ENCOUNTER — Encounter (HOSPITAL_COMMUNITY): Payer: Self-pay | Admitting: Psychiatry

## 2023-05-21 DIAGNOSIS — Z9151 Personal history of suicidal behavior: Secondary | ICD-10-CM | POA: Diagnosis not present

## 2023-05-21 DIAGNOSIS — F17210 Nicotine dependence, cigarettes, uncomplicated: Secondary | ICD-10-CM | POA: Diagnosis present

## 2023-05-21 DIAGNOSIS — F1414 Cocaine abuse with cocaine-induced mood disorder: Secondary | ICD-10-CM | POA: Insufficient documentation

## 2023-05-21 DIAGNOSIS — F411 Generalized anxiety disorder: Secondary | ICD-10-CM | POA: Diagnosis present

## 2023-05-21 DIAGNOSIS — F1010 Alcohol abuse, uncomplicated: Secondary | ICD-10-CM | POA: Diagnosis present

## 2023-05-21 DIAGNOSIS — F1210 Cannabis abuse, uncomplicated: Secondary | ICD-10-CM | POA: Diagnosis not present

## 2023-05-21 DIAGNOSIS — G4700 Insomnia, unspecified: Secondary | ICD-10-CM | POA: Diagnosis present

## 2023-05-21 DIAGNOSIS — Z5986 Financial insecurity: Secondary | ICD-10-CM | POA: Diagnosis not present

## 2023-05-21 DIAGNOSIS — R45851 Suicidal ideations: Secondary | ICD-10-CM | POA: Diagnosis present

## 2023-05-21 DIAGNOSIS — F1994 Other psychoactive substance use, unspecified with psychoactive substance-induced mood disorder: Secondary | ICD-10-CM | POA: Diagnosis not present

## 2023-05-21 DIAGNOSIS — Z8701 Personal history of pneumonia (recurrent): Secondary | ICD-10-CM | POA: Diagnosis not present

## 2023-05-21 DIAGNOSIS — R001 Bradycardia, unspecified: Secondary | ICD-10-CM | POA: Diagnosis not present

## 2023-05-21 DIAGNOSIS — F1220 Cannabis dependence, uncomplicated: Secondary | ICD-10-CM | POA: Diagnosis present

## 2023-05-21 DIAGNOSIS — E559 Vitamin D deficiency, unspecified: Secondary | ICD-10-CM | POA: Diagnosis present

## 2023-05-21 DIAGNOSIS — E538 Deficiency of other specified B group vitamins: Secondary | ICD-10-CM | POA: Diagnosis present

## 2023-05-21 DIAGNOSIS — F332 Major depressive disorder, recurrent severe without psychotic features: Secondary | ICD-10-CM | POA: Diagnosis not present

## 2023-05-21 DIAGNOSIS — Z91199 Patient's noncompliance with other medical treatment and regimen due to unspecified reason: Secondary | ICD-10-CM

## 2023-05-21 DIAGNOSIS — Z79899 Other long term (current) drug therapy: Secondary | ICD-10-CM

## 2023-05-21 HISTORY — DX: Deficiency of other specified B group vitamins: E53.8

## 2023-05-21 LAB — URINALYSIS, COMPLETE (UACMP) WITH MICROSCOPIC
Bilirubin Urine: NEGATIVE
Glucose, UA: NEGATIVE mg/dL
Hgb urine dipstick: NEGATIVE
Ketones, ur: NEGATIVE mg/dL
Leukocytes,Ua: NEGATIVE
Nitrite: NEGATIVE
Protein, ur: NEGATIVE mg/dL
Specific Gravity, Urine: 1.011 (ref 1.005–1.030)
pH: 6 (ref 5.0–8.0)

## 2023-05-21 LAB — COMPREHENSIVE METABOLIC PANEL
ALT: 17 U/L (ref 0–44)
AST: 22 U/L (ref 15–41)
Albumin: 4.2 g/dL (ref 3.5–5.0)
Alkaline Phosphatase: 32 U/L — ABNORMAL LOW (ref 38–126)
Anion gap: 14 (ref 5–15)
BUN: 7 mg/dL (ref 6–20)
CO2: 21 mmol/L — ABNORMAL LOW (ref 22–32)
Calcium: 8.9 mg/dL (ref 8.9–10.3)
Chloride: 102 mmol/L (ref 98–111)
Creatinine, Ser: 1.08 mg/dL (ref 0.61–1.24)
GFR, Estimated: >60 mL/min (ref >60)
Glucose, Bld: 65 mg/dL — ABNORMAL LOW (ref 70–99)
Potassium: 4.1 mmol/L (ref 3.5–5.1)
Sodium: 137 mmol/L (ref 135–145)
Total Bilirubin: 0.8 mg/dL (ref 0.0–1.2)
Total Protein: 7.1 g/dL (ref 6.5–8.1)

## 2023-05-21 LAB — CBC WITH DIFFERENTIAL/PLATELET
Abs Immature Granulocytes: 0.04 10*3/uL (ref 0.00–0.07)
Basophils Absolute: 0.1 10*3/uL (ref 0.0–0.1)
Basophils Relative: 0 %
Eosinophils Absolute: 0.2 10*3/uL (ref 0.0–0.5)
Eosinophils Relative: 1 %
HCT: 52 % (ref 39.0–52.0)
Hemoglobin: 17.8 g/dL — ABNORMAL HIGH (ref 13.0–17.0)
Immature Granulocytes: 0 %
Lymphocytes Relative: 16 %
Lymphs Abs: 2.2 10*3/uL (ref 0.7–4.0)
MCH: 30.8 pg (ref 26.0–34.0)
MCHC: 34.2 g/dL (ref 30.0–36.0)
MCV: 90 fL (ref 80.0–100.0)
Monocytes Absolute: 0.8 10*3/uL (ref 0.1–1.0)
Monocytes Relative: 6 %
Neutro Abs: 10 10*3/uL — ABNORMAL HIGH (ref 1.7–7.7)
Neutrophils Relative %: 77 %
Platelets: 287 10*3/uL (ref 150–400)
RBC: 5.78 MIL/uL (ref 4.22–5.81)
RDW: 14 % (ref 11.5–15.5)
WBC: 13.3 10*3/uL — ABNORMAL HIGH (ref 4.0–10.5)
nRBC: 0 % (ref 0.0–0.2)

## 2023-05-21 LAB — POCT URINE DRUG SCREEN - MANUAL ENTRY (I-SCREEN)
POC Amphetamine UR: NOT DETECTED
POC Buprenorphine (BUP): NOT DETECTED
POC Cocaine UR: POSITIVE — AB
POC Marijuana UR: POSITIVE — AB
POC Methadone UR: NOT DETECTED
POC Methamphetamine UR: NOT DETECTED
POC Morphine: NOT DETECTED
POC Oxazepam (BZO): NOT DETECTED
POC Oxycodone UR: NOT DETECTED
POC Secobarbital (BAR): NOT DETECTED

## 2023-05-21 LAB — LIPID PANEL
Cholesterol: 192 mg/dL (ref 0–200)
HDL: 35 mg/dL — ABNORMAL LOW (ref >40)
LDL Cholesterol: 122 mg/dL — ABNORMAL HIGH (ref 0–99)
Total CHOL/HDL Ratio: 5.5 {ratio}
Triglycerides: 174 mg/dL — ABNORMAL HIGH (ref <150)
VLDL: 35 mg/dL (ref 0–40)

## 2023-05-21 LAB — HEMOGLOBIN A1C
Hgb A1c MFr Bld: 5 % (ref 4.8–5.6)
Mean Plasma Glucose: 96.8 mg/dL

## 2023-05-21 LAB — ETHANOL: Alcohol, Ethyl (B): <10 mg/dL (ref <10)

## 2023-05-21 LAB — TSH: TSH: 0.499 u[IU]/mL (ref 0.350–4.500)

## 2023-05-21 LAB — MAGNESIUM: Magnesium: 2 mg/dL (ref 1.7–2.4)

## 2023-05-21 LAB — VITAMIN B12: Vitamin B-12: 283 pg/mL (ref 180–914)

## 2023-05-21 MED ORDER — HALOPERIDOL LACTATE 5 MG/ML IJ SOLN: 10.0000 mg | Freq: Three times a day (TID) | INTRAMUSCULAR | Status: DC | PRN

## 2023-05-21 MED ORDER — THIAMINE MONONITRATE 100 MG PO TABS: 100.0000 mg | ORAL_TABLET | Freq: Every day | ORAL | Status: DC

## 2023-05-21 MED ORDER — DIPHENHYDRAMINE HCL 25 MG PO CAPS: 50.0000 mg | ORAL_CAPSULE | Freq: Three times a day (TID) | ORAL | Status: DC | PRN

## 2023-05-21 MED ORDER — DIPHENHYDRAMINE HCL 50 MG/ML IJ SOLN: 50.0000 mg | Freq: Three times a day (TID) | INTRAMUSCULAR | Status: DC | PRN

## 2023-05-21 MED ORDER — LORAZEPAM 2 MG/ML IJ SOLN: 2.0000 mg | Freq: Three times a day (TID) | INTRAMUSCULAR | Status: DC | PRN

## 2023-05-21 MED ORDER — ACETAMINOPHEN 325 MG PO TABS
650.0000 mg | ORAL_TABLET | Freq: Four times a day (QID) | ORAL | Status: DC | PRN
  Administered 2023-05-21: 650 mg via ORAL
  Filled 2023-05-21: qty 2

## 2023-05-21 MED ORDER — LORAZEPAM 1 MG PO TABS: 1.0000 mg | ORAL_TABLET | Freq: Four times a day (QID) | ORAL | Status: AC | PRN

## 2023-05-21 MED ORDER — ADULT MULTIVITAMIN W/MINERALS CH
1.0000 | ORAL_TABLET | Freq: Every day | ORAL | Status: DC
  Filled 2023-05-21 (×2): qty 1

## 2023-05-21 MED ORDER — SERTRALINE HCL 50 MG PO TABS
50.0000 mg | ORAL_TABLET | Freq: Every day | ORAL | Status: DC
Start: 2023-05-22 — End: 2023-05-25
  Administered 2023-05-22 – 2023-05-25 (×4): 50 mg via ORAL
  Filled 2023-05-21: qty 1
  Filled 2023-05-21 (×2): qty 14
  Filled 2023-05-21 (×3): qty 1

## 2023-05-21 MED ORDER — VITAMIN B-1 100 MG PO TABS
100.0000 mg | ORAL_TABLET | Freq: Every day | ORAL | Status: DC
  Filled 2023-05-21 (×2): qty 1

## 2023-05-21 MED ORDER — LORAZEPAM 1 MG PO TABS: 1.0000 mg | ORAL_TABLET | Freq: Four times a day (QID) | ORAL | Status: DC | PRN

## 2023-05-21 MED ORDER — TRAZODONE HCL 50 MG PO TABS: 50.0000 mg | ORAL_TABLET | Freq: Every evening | ORAL | Status: DC | PRN

## 2023-05-21 MED ORDER — MAGNESIUM HYDROXIDE 400 MG/5ML PO SUSP: 30.0000 mL | Freq: Every day | ORAL | Status: DC | PRN

## 2023-05-21 MED ORDER — HYDROXYZINE HCL 25 MG PO TABS: 25.0000 mg | ORAL_TABLET | Freq: Four times a day (QID) | ORAL | Status: DC | PRN

## 2023-05-21 MED ORDER — HYDROXYZINE HCL 25 MG PO TABS: 25.0000 mg | ORAL_TABLET | Freq: Three times a day (TID) | ORAL | Status: DC | PRN

## 2023-05-21 MED ORDER — DIPHENHYDRAMINE HCL 50 MG PO CAPS: 50.0000 mg | ORAL_CAPSULE | Freq: Three times a day (TID) | ORAL | Status: DC | PRN

## 2023-05-21 MED ORDER — TRAZODONE HCL 50 MG PO TABS
50.0000 mg | ORAL_TABLET | Freq: Every evening | ORAL | Status: DC | PRN
  Administered 2023-05-22 – 2023-05-23 (×2): 50 mg via ORAL
  Filled 2023-05-21: qty 1
  Filled 2023-05-21: qty 14
  Filled 2023-05-21: qty 1

## 2023-05-21 MED ORDER — ALUM & MAG HYDROXIDE-SIMETH 200-200-20 MG/5ML PO SUSP: 30.0000 mL | ORAL | Status: DC | PRN

## 2023-05-21 MED ORDER — HYDROXYZINE HCL 25 MG PO TABS
25.0000 mg | ORAL_TABLET | Freq: Four times a day (QID) | ORAL | Status: AC | PRN
  Administered 2023-05-23: 25 mg via ORAL

## 2023-05-21 MED ORDER — THIAMINE HCL 100 MG/ML IJ SOLN
100.0000 mg | Freq: Once | INTRAMUSCULAR | Status: AC
  Administered 2023-05-21: 100 mg via INTRAMUSCULAR
  Filled 2023-05-21: qty 2

## 2023-05-21 MED ORDER — ADULT MULTIVITAMIN W/MINERALS CH
1.0000 | ORAL_TABLET | Freq: Every day | ORAL | Status: DC
  Administered 2023-05-21: 1 via ORAL
  Filled 2023-05-21: qty 1

## 2023-05-21 MED ORDER — HYDROXYZINE HCL 25 MG PO TABS
25.0000 mg | ORAL_TABLET | Freq: Three times a day (TID) | ORAL | Status: DC | PRN
  Administered 2023-05-22: 25 mg via ORAL
  Filled 2023-05-21 (×2): qty 1

## 2023-05-21 MED ORDER — TRAZODONE HCL 50 MG PO TABS
50.0000 mg | ORAL_TABLET | Freq: Every evening | ORAL | Status: DC | PRN
  Administered 2023-05-21: 50 mg via ORAL
  Filled 2023-05-21: qty 1

## 2023-05-21 MED ORDER — LOPERAMIDE HCL 2 MG PO CAPS: 2.0000 mg | ORAL_CAPSULE | ORAL | Status: DC | PRN

## 2023-05-21 MED ORDER — HALOPERIDOL LACTATE 5 MG/ML IJ SOLN: 5.0000 mg | Freq: Three times a day (TID) | INTRAMUSCULAR | Status: DC | PRN

## 2023-05-21 MED ORDER — ONDANSETRON 4 MG PO TBDP: 4.0000 mg | ORAL_TABLET | Freq: Four times a day (QID) | ORAL | Status: DC | PRN

## 2023-05-21 MED ORDER — HALOPERIDOL 5 MG PO TABS: 5.0000 mg | ORAL_TABLET | Freq: Three times a day (TID) | ORAL | Status: DC | PRN

## 2023-05-21 MED ORDER — ADULT MULTIVITAMIN W/MINERALS CH
1.0000 | ORAL_TABLET | Freq: Every day | ORAL | Status: DC
  Administered 2023-05-22 – 2023-05-28 (×7): 1 via ORAL
  Filled 2023-05-21 (×8): qty 1

## 2023-05-21 MED ORDER — ACETAMINOPHEN 325 MG PO TABS
650.0000 mg | ORAL_TABLET | Freq: Four times a day (QID) | ORAL | Status: DC | PRN
  Filled 2023-05-21: qty 2

## 2023-05-21 MED ORDER — LOPERAMIDE HCL 2 MG PO CAPS: 2.0000 mg | ORAL_CAPSULE | ORAL | Status: AC | PRN

## 2023-05-21 MED ORDER — ACETAMINOPHEN 325 MG PO TABS
650.0000 mg | ORAL_TABLET | Freq: Four times a day (QID) | ORAL | Status: DC | PRN
  Administered 2023-05-23: 650 mg via ORAL

## 2023-05-21 MED ORDER — ONDANSETRON 4 MG PO TBDP: 4.0000 mg | ORAL_TABLET | Freq: Four times a day (QID) | ORAL | Status: AC | PRN

## 2023-05-21 MED ORDER — VITAMIN B-1 100 MG PO TABS
100.0000 mg | ORAL_TABLET | Freq: Every day | ORAL | Status: DC
  Administered 2023-05-22 – 2023-05-28 (×7): 100 mg via ORAL
  Filled 2023-05-21 (×8): qty 1

## 2023-05-21 NOTE — ED Provider Notes (Cosign Needed Addendum)
[facility] Continuous Assessment Admission H&P  Date: 05/21/23 Patient Name: Jonathan [name] MRN: [id number] Chief Complaint:  "I need cocaine detox in a residential rehab"   Diagnoses:  Final diagnoses:  Substance induced mood disorder (HCC)    History of Present illness: Jonathan [name] is a 28 y.o. male patient presented to [facility] as a walk in  unaccompanied with  complaints of "I need cocaine detox in a residential rehab"   Jonathan [name], 28 y.o., male patient seen face to face by this provider, consulted with Dr. Woodroe Mode; and chart reviewed on 05/21/23.  Per chart review patient has a past psychiatric history of MDD, polysubstance abuse which includes alcohol, THC, and cocaine.  He reports 2 previous inpatient psychiatric admissions due to SI.  He has also been admitted to the [facility] last admission 01/2023.  He was discharged to [facility] residential at that time.  He has no psychiatric services in place.  He does not take any medications.  He currently lives with his cousin and works full-time.  He denies any upcoming legal charges.   On evaluation Jonathan [name] reports he has a longstanding history of substance use.  He received treatment at [facility] residential 01/2023.  Once he completed program he immediately relapsed and has been using cocaine almost daily since that time.  He uses/snorts up to a "8 ball" per day depending on his findings and assessability.  His last use was this morning.  He endorses occasional alcohol use, 2-3 beers at a time his last use was last night.  Reports he only drinks alcohol maybe a couple times per week.  He does not consider it to be a problem he is focused on being detox from cocaine.  He presents today requesting cocaine detox and residential substance abuse treatment.   During evaluation Jonathan [name] is observed sitting in assessment room with his head on the table asleep.  He is in no acute distress.  He is alert/oriented x 4, cooperative, and  attentive.  He has normal speech and behavior.  He endorses depression in the context of his cocaine use.  He feels worthless, helpless, guilty, has decreased sleep and appetite.  He has a depressed affect.  He has not slept in the last 24 hours as he has been using cocaine.  He is unsure if he has lost weight.  He initially denied suicidal ideations but when discussing outpatient treatment options he stated that he did not feel safe returning home at this time.  He began to endorse passive suicidal ideations with no plan or intent.  He does not have access to firearms/weapons.  He denies homicidal ideations.  He denies auditory/visual hallucinations. Objectively there is no evidence of psychosis/mania or delusional thinking.  Patient is able to converse coherently, goal directed thoughts, no distractibility, or pre-occupation.   Patient answered question appropriately.       Discussed inpatient psychiatric admission and patient is in agreement.  Initially considered admission to the [facility] but there are no available male beds.  Notified: [facility] and Child psychotherapist of need.  Total Time spent with patient: 30 minutes  Musculoskeletal  Strength & Muscle Tone: within normal limits Gait & Station: normal Patient leans: N/A  Psychiatric Specialty Exam  Presentation General Appearance:  Appropriate for Environment; Casual  Eye Contact: Good  Speech: Clear and Coherent; Normal Rate  Speech Volume: Normal  Handedness: Right   Mood and Affect  Mood: Anxious; Depressed  Affect: Congruent   Thought Process  Thought Processes: Coherent  Descriptions of Associations:Intact  Orientation:Full (Time, Place and Person)  Thought Content:Logical  Diagnosis of Schizophrenia or Schizoaffective disorder in past: No   Hallucinations:Hallucinations: None  Ideas of Reference:None  Suicidal Thoughts:Suicidal Thoughts: Yes, Passive SI Passive Intent and/or Plan: Without Intent; Without Plan;  Without Means to Carry Out  Homicidal Thoughts:Homicidal Thoughts: No   Sensorium  Memory: Immediate Good; Recent Good; Remote Good  Judgment: Fair  Insight: Fair   Art therapist  Concentration: Good  Attention Span: Good  Recall: Good  Fund of Knowledge: Good  Language: Good   Psychomotor Activity  Psychomotor Activity: Psychomotor Activity: Normal   Assets  Assets: Communication Skills; Desire for Improvement; Housing; Leisure Time; Physical Health; Resilience   Sleep  Sleep: Sleep: Poor Number of Hours of Sleep: 0   Nutritional Assessment (For OBS and FBC admissions only) Has the patient had a weight loss or gain of 10 pounds or more in the last 3 months?: Yes Has the patient had a decrease in food intake/or appetite?: Yes Does the patient have dental problems?: No Does the patient have eating habits or behaviors that may be indicators of an eating disorder including binging or inducing vomiting?: No Has the patient recently lost weight without trying?: 2.0 Has the patient been eating poorly because of a decreased appetite?: 1 Malnutrition Screening Tool Score: 3    Physical Exam Constitutional:      Appearance: Normal appearance.  Eyes:     General:        Right eye: No discharge.        Left eye: No discharge.  Cardiovascular:     Rate and Rhythm: Tachycardia present.  Pulmonary:     Effort: Pulmonary effort is normal. No respiratory distress.  Musculoskeletal:        General: Normal range of motion.     Cervical back: Normal range of motion.  Neurological:     Mental Status: He is alert and oriented to person, place, and time.  Psychiatric:        Attention and Perception: Attention and perception normal.        Mood and Affect: Mood is anxious and depressed.        Speech: Speech normal.        Behavior: Behavior normal. Behavior is cooperative.        Thought Content: Thought content includes suicidal ideation. Thought  content does not include suicidal plan.        Cognition and Memory: Cognition normal.        Judgment: Judgment is impulsive.    Review of Systems  Constitutional:  Negative for chills and fever.  HENT:  Negative for hearing loss.   Respiratory:  Negative for cough and shortness of breath.   Cardiovascular:  Negative for chest pain.  Musculoskeletal: Negative.   Psychiatric/Behavioral:  Positive for depression, substance abuse and suicidal ideas. The patient is nervous/anxious and has insomnia.     Blood pressure (!) 122/91, pulse 88, temperature 97.8 F (36.6 C), temperature source Oral, resp. rate 17, SpO2 100%. There is no height or weight on file to calculate BMI.  Past Psychiatric Hx: Previous Psych Diagnoses: MDD, GAD, cocaine use disorder, alcohol use disorder Prior inpatient treatment: [facility] 11/18/2022 [facility] on 01/20/2022 Current/prior outpatient treatment: None at this time Prior rehab hx: Denies Psychotherapy hx: Denies  history of suicide attempts: Denies History of homicide or aggression: Denies  Is the patient at risk to self? Yes  Has the patient been a risk to self in the past 6 months? Yes .    Has the patient been a risk to self within the distant past? Yes   Is the patient a risk to others? No   Has the patient been a risk to others in the past 6 months? No   Has the patient been a risk to others within the distant past? No     Psychiatric medication history: history of Zoloft, trazodone, hydroxyzine  Psychiatric medication compliance history: Noncompliant Neuromodulation history: Denies Current Psychiatrist: None Current therapist: None   Substance Abuse Hx: Alcohol: Started using at age 80 years old, currently drinks 2 beers (16 ounces each) 2 times per week. 2-3 beers last night.  Denies a history of alcohol withdrawal symptoms in the past, denies blackouts related to alcohol use in the past, denies she has seizure activities related to alcohol use  in the past. Illicit drugs: Cocaine every other day sometimes more depending on availability and funds, started at age 64    Denies other substance use Rx drug abuse: Denies Rehab hx: Interested in residential  rehab,   Past Medical History: Medical Diagnoses: Denies Home Rx: Denies Prior Hosp: Denies Prior Surgeries/Trauma: Denies Head trauma, LOC, concussions, seizures: Denies Allergies: Denies   Family History: Medical: Diabetes and father, colon cancer in paternal grandmother who is now deceased Psych: Mother had COPD Psych Rx: Unsure SA/HA: Denies Substance use family hx: Mother had substance abuse issues, passed away in 2017/06/02.    Social History: Patient reports that he was born in [place], [place], raised in [place], [place].  He reports that he has 4 older siblings; 3 brothers and 1 sister, but is currently estranged from them, and has not seen them for at least 3 years.  He reports also being estranged from his father for at least 3 years. Marital Status: Single,  Sexual orientation:heterosexual Children: 1 son who is 74 years old Employment: full time  Peer Group: None Housing: lives with cousin Finances: A stressor Legal: Denies Military: Denies   Last Labs:  Admission on 02/25/2023, Discharged on 02/25/2023  Component Date Value Ref Range Status   Group A Strep by PCR 02/25/2023 DETECTED (A)  NOT DETECTED Final   Performed at [facility], [address], [place], [place] [address]  Admission on 12/29/2022, Discharged on 12/31/2022  Component Date Value Ref Range Status   WBC 12/29/2022 8.6  4.0 - 10.5 K/uL Final   RBC 12/29/2022 4.95  4.22 - 5.81 MIL/uL Final   Hemoglobin 12/29/2022 15.6  13.0 - 17.0 g/dL Final   HCT 29/52/8413 45.2  39.0 - 52.0 % Final   MCV 12/29/2022 91.3  80.0 - 100.0 fL Final   MCH 12/29/2022 31.5  26.0 - 34.0 pg Final   MCHC 12/29/2022 34.5  30.0 - 36.0 g/dL Final   RDW 24/40/1027 13.5  11.5 - 15.5 % Final    Platelets 12/29/2022 356  150 - 400 K/uL Final   nRBC 12/29/2022 0.0  0.0 - 0.2 % Final   Neutrophils Relative % 12/29/2022 54  % Final   Neutro Abs 12/29/2022 4.6  1.7 - 7.7 K/uL Final   Lymphocytes Relative 12/29/2022 29  % Final   Lymphs Abs 12/29/2022 2.5  0.7 - 4.0 K/uL Final   Monocytes Relative 12/29/2022 12  % Final   Monocytes Absolute 12/29/2022 1.0  0.1 - 1.0 K/uL Final   Eosinophils Relative 12/29/2022 4  % Final  Eosinophils Absolute 12/29/2022 0.4  0.0 - 0.5 K/uL Final   Basophils Relative 12/29/2022 1  % Final   Basophils Absolute 12/29/2022 0.1  0.0 - 0.1 K/uL Final   Immature Granulocytes 12/29/2022 0  % Final   Abs Immature Granulocytes 12/29/2022 0.02  0.00 - 0.07 K/uL Final   Performed at [facility], [address], [place], [place] [address]   Sodium 12/29/2022 138  135 - 145 mmol/L Final   Potassium 12/29/2022 3.9  3.5 - 5.1 mmol/L Final   Chloride 12/29/2022 103  98 - 111 mmol/L Final   CO2 12/29/2022 23  22 - 32 mmol/L Final   Glucose, Bld 12/29/2022 81  70 - 99 mg/dL Final   Glucose reference range applies only to samples taken after fasting for at least 8 hours.   BUN 12/29/2022 14  6 - 20 mg/dL Final   Creatinine, Ser 12/29/2022 1.12  0.61 - 1.24 mg/dL Final   Calcium 21/30/8657 9.1  8.9 - 10.3 mg/dL Final   Total Protein 84/69/6295 6.7  6.5 - 8.1 g/dL Final   Albumin 28/41/3244 3.9  3.5 - 5.0 g/dL Final   AST 04/04/7251 21  15 - 41 U/L Final   ALT 12/29/2022 21  0 - 44 U/L Final   Alkaline Phosphatase 12/29/2022 37 (L)  38 - 126 U/L Final   Total Bilirubin 12/29/2022 0.6  0.3 - 1.2 mg/dL Final   GFR, Estimated 12/29/2022 >60  >60 mL/min Final   Comment: (NOTE) Calculated using the CKD-EPI Creatinine Equation (2021)    Anion gap 12/29/2022 12  5 - 15 Final   Performed at [facility], [address] [place], [place] [address]   Magnesium 12/29/2022 2.1  1.7 - 2.4 mg/dL Final   Performed at [facility], [address]  [place], [place] [address]   Alcohol, Ethyl (B) 12/29/2022 <10  <10 mg/dL Final   Comment: (NOTE) Lowest detectable limit for serum alcohol is 10 mg/dL.  For medical purposes only. Performed at [facility], [address], [place], [place] [address]    Cholesterol 12/29/2022 213 (H)  0 - 200 mg/dL Final   Triglycerides 87/56/4332 238 (H)  <150 mg/dL Final   HDL 95/18/8416 40 (L)  >40 mg/dL Final   Total CHOL/HDL Ratio 12/29/2022 5.3  RATIO Final   VLDL 12/29/2022 48 (H)  0 - 40 mg/dL Final   LDL Cholesterol 12/29/2022 125 (H)  0 - 99 mg/dL Final   Comment:        Total Cholesterol/HDL:CHD Risk Coronary Heart Disease Risk Table                     Men   Women  1/2 Average Risk   3.4   3.3  Average Risk       5.0   4.4  2 X Average Risk   9.6   7.1  3 X Average Risk  23.4   11.0        Use the calculated Patient Ratio above and the CHD Risk Table to determine the patient's CHD Risk.        ATP III CLASSIFICATION (LDL):  <100     mg/dL   Optimal  606-301  mg/dL   Near or Above                    Optimal  130-159  mg/dL   Borderline  601-093  mg/dL   High  >161     mg/dL   Very High Performed at [facility], [address], [place], [place] [address]    TSH 12/29/2022 0.933  0.350 - 4.500 uIU/mL Final   Comment: Performed by a 3rd Generation assay with a functional sensitivity of <=0.01 uIU/mL. Performed at [facility], [address] [place], [place] [address]    RPR Ser Ql 12/29/2022 NON REACTIVE  NON REACTIVE Final   Performed at [facility], [address], [place], [place] [address]   Color, Urine 12/30/2022 YELLOW  YELLOW Final   APPearance 12/30/2022 CLEAR  CLEAR Final   Specific Gravity, Urine 12/30/2022 1.017  1.005 - 1.030 Final   pH 12/30/2022 7.0  5.0 - 8.0 Final   Glucose, UA 12/30/2022 NEGATIVE  NEGATIVE mg/dL Final   Hgb urine dipstick 12/30/2022 NEGATIVE  NEGATIVE Final   Bilirubin Urine 12/30/2022 NEGATIVE  NEGATIVE Final    Ketones, ur 12/30/2022 NEGATIVE  NEGATIVE mg/dL Final   Protein, ur 78/29/5621 NEGATIVE  NEGATIVE mg/dL Final   Nitrite 30/86/5784 NEGATIVE  NEGATIVE Final   Leukocytes,Ua 12/30/2022 NEGATIVE  NEGATIVE Final   Performed at [facility], [address], [place], [place] [address]   POC Amphetamine UR 12/29/2022 None Detected  NONE DETECTED (Cut Off Level 1000 ng/mL) Final   POC Secobarbital (BAR) 12/29/2022 None Detected  NONE DETECTED (Cut Off Level 300 ng/mL) Final   POC Buprenorphine (BUP) 12/29/2022 None Detected  NONE DETECTED (Cut Off Level 10 ng/mL) Final   POC Oxazepam (BZO) 12/29/2022 None Detected  NONE DETECTED (Cut Off Level 300 ng/mL) Final   POC Cocaine UR 12/29/2022 Positive (A)  NONE DETECTED (Cut Off Level 300 ng/mL) Final   POC Methamphetamine UR 12/29/2022 None Detected (A)  NONE DETECTED (Cut Off Level 1000 ng/mL) Final   POC Morphine 12/29/2022 None Detected  NONE DETECTED (Cut Off Level 300 ng/mL) Final   POC Methadone UR 12/29/2022 None Detected  NONE DETECTED (Cut Off Level 300 ng/mL) Final   POC Oxycodone UR 12/29/2022 None Detected  NONE DETECTED (Cut Off Level 100 ng/mL) Final   POC Marijuana UR 12/29/2022 Positive (A)  NONE DETECTED (Cut Off Level 50 ng/mL) Final  Admission on 12/20/2022, Discharged on 12/20/2022  Component Date Value Ref Range Status   Sodium 12/20/2022 139  135 - 145 mmol/L Final   Potassium 12/20/2022 3.8  3.5 - 5.1 mmol/L Final   Chloride 12/20/2022 106  98 - 111 mmol/L Final   CO2 12/20/2022 26  22 - 32 mmol/L Final   Glucose, Bld 12/20/2022 88  70 - 99 mg/dL Final   Glucose reference range applies only to samples taken after fasting for at least 8 hours.   BUN 12/20/2022 16  6 - 20 mg/dL Final   Creatinine, Ser 12/20/2022 1.15  0.61 - 1.24 mg/dL Final   Calcium 52/84/1324 8.3 (L)  8.9 - 10.3 mg/dL Final   GFR, Estimated 12/20/2022 >60  >60 mL/min Final   Comment: (NOTE) Calculated using the CKD-EPI Creatinine Equation (2021)     Anion gap 12/20/2022 7  5 - 15 Final   Performed at [facility], [address], [place], [place] [address]   WBC 12/20/2022 9.3  4.0 - 10.5 K/uL Final   RBC 12/20/2022 4.56  4.22 - 5.81 MIL/uL Final   Hemoglobin 12/20/2022 14.3  13.0 - 17.0 g/dL Final   HCT 72/53/6644 40.7  39.0 - 52.0 %  Final   MCV 12/20/2022 89.3  80.0 - 100.0 fL Final   MCH 12/20/2022 31.4  26.0 - 34.0 pg Final   MCHC 12/20/2022 35.1  30.0 - 36.0 g/dL Final   RDW 16/01/9603 13.5  11.5 - 15.5 % Final   Platelets 12/20/2022 254  150 - 400 K/uL Final   nRBC 12/20/2022 0.0  0.0 - 0.2 % Final   Performed at [facility], [address], [place], [place] [address]   Troponin I (High Sensitivity) 12/20/2022 3  <18 ng/L Final   Comment: (NOTE) Elevated high sensitivity troponin I (hsTnI) values and significant  changes across serial measurements may suggest ACS but many other  chronic and acute conditions are known to elevate hsTnI results.  Refer to the "Links" section for chest pain algorithms and additional  guidance. Performed at [facility], [address] [place], [place] [address]    Opiates 12/20/2022 NONE DETECTED  NONE DETECTED Final   Cocaine 12/20/2022 POSITIVE (A)  NONE DETECTED Final   Benzodiazepines 12/20/2022 NONE DETECTED  NONE DETECTED Final   Amphetamines 12/20/2022 NONE DETECTED  NONE DETECTED Final   Tetrahydrocannabinol 12/20/2022 POSITIVE (A)  NONE DETECTED Final   Barbiturates 12/20/2022 NONE DETECTED  NONE DETECTED Final   Comment: (NOTE) DRUG SCREEN FOR MEDICAL PURPOSES ONLY.  IF CONFIRMATION IS NEEDED FOR ANY PURPOSE, NOTIFY LAB WITHIN 5 DAYS.  LOWEST DETECTABLE LIMITS FOR URINE DRUG SCREEN Drug Class                     Cutoff (ng/mL) Amphetamine and metabolites    1000 Barbiturate and metabolites    200 Benzodiazepine                 200 Opiates and metabolites        300 Cocaine and metabolites        300 THC                             50 Performed at [facility], [address], [place], [place] [address]   Admission on 11/17/2022, Discharged on 11/21/2022  Component Date Value Ref Range Status   Cholesterol 11/20/2022 171  0 - 200 mg/dL Final   Triglycerides 62/13/0865 192 (H)  <150 mg/dL Final   HDL 78/46/9629 37 (L)  >40 mg/dL Final   Total CHOL/HDL Ratio 11/20/2022 4.6  RATIO Final   VLDL 11/20/2022 38  0 - 40 mg/dL Final   LDL Cholesterol 11/20/2022 96  0 - 99 mg/dL Final   Comment:        Total Cholesterol/HDL:CHD Risk Coronary Heart Disease Risk Table                     Men   Women  1/2 Average Risk   3.4   3.3  Average Risk       5.0   4.4  2 X Average Risk   9.6   7.1  3 X Average Risk  23.4   11.0        Use the calculated Patient Ratio above and the CHD Risk Table to determine the patient's CHD Risk.        ATP III CLASSIFICATION (LDL):  <100     mg/dL   Optimal  528-413  mg/dL   Near or Above  Optimal  130-159  mg/dL   Borderline  102-725  mg/dL   High  >366     mg/dL   Very High Performed at [facility], [address] [place], [place] [address]    Hgb A1c MFr Bld 11/20/2022 5.5  4.8 - 5.6 % Final   Comment: (NOTE) Pre diabetes:          5.7%-6.4%  Diabetes:              >6.4%  Glycemic control for   <7.0% adults with diabetes    Mean Plasma Glucose 11/20/2022 111.15  mg/dL Final   Performed at [facility], [address] [place], [place] [address]   TSH 11/20/2022 1.248  0.350 - 4.500 uIU/mL Final   Comment: Performed by a 3rd Generation assay with a functional sensitivity of <=0.01 uIU/mL. Performed at [facility], [address], [place], [place] [address]    Vit D, 25-Hydroxy 11/20/2022 26.84 (L)  30 - 100 ng/mL Final   Comment: (NOTE) Vitamin D deficiency has been defined by the Institute of Medicine  and an Endocrine Society practice guideline as a level of serum 25-OH   vitamin D less than 20 ng/mL (1,2). The Endocrine Society went on to  further define vitamin D insufficiency as a level between 21 and 29  ng/mL (2).  1. IOM (Institute of Medicine). 2010. Dietary reference intakes for  calcium and D. [place]: [employer]. 2. Holick MF, Binkley Granby, Bischoff-Ferrari HA, et al. Evaluation,  treatment, and prevention of vitamin D deficiency: an Endocrine  Society clinical practice guideline, JCEM. 2011 Jul; 96(7): 1911-30.  Performed at [facility], [address] [place], [place] [address]     Allergies: Patient has no known allergies.  Medications:  Facility Ordered Medications  Medication   acetaminophen (TYLENOL) tablet 650 mg   alum & mag hydroxide-simeth (MAALOX/MYLANTA) 200-200-20 MG/5ML suspension 30 mL   magnesium hydroxide (MILK OF MAGNESIA) suspension 30 mL   traZODone (DESYREL) tablet 50 mg   hydrOXYzine (ATARAX) tablet 25 mg      Medical Decision Making   Patient will be admitted to the continuous assessment unit while awaiting inpatient psychiatric bed availability.  Medications: Start Zoloft 50 mg daily for depression  Start CIWA protocol as precautionary measurers   -Ativan 1 mg p.o. every 6 hours for CIWA score greater than 10 Start-agitation protocol   Lab Orders         CBC with Differential/Platelet         Comprehensive metabolic panel         Hemoglobin A1c         Magnesium         Ethanol         Lipid panel         TSH         Urinalysis, Complete w Microscopic -Urine, Clean Catch         Vitamin B12         POCT Urine Drug Screen - (I-Screen)    EKG      Recommendations  Based on my evaluation the patient does not appear to have an emergency medical condition.  Recommend inpatient psychiatric admission and patient is in agreement.  Initially considered admission to the [facility] but there are no available male beds.  Notified: [facility] and Child psychotherapist of need.   Ardis Hughs, NP 05/21/23  11:28  AM

## 2023-05-21 NOTE — Progress Notes (Signed)
   05/21/23 0736  [facility] Triage Screening (Walk-ins at [facility] only)  How Did You Hear About [place]? Self  What Is the Reason for Your Visit/Call Today? Dontea Corlew presents to [facility] voluntarily unaccompanied. Pt states that he has a problem with cocaine and alcohol. Pt currently denies SI, HI, and AVH. Pt states that he used cocaine (unsure of the amount, but alot) and beer (2-3 cans) last night. Pt states that he would like to get into a detox or rehab program.  How Long Has This Been Causing You Problems? > than 6 months  Have You Recently Had Any Thoughts About Hurting Yourself? No  Are You Planning to Commit Suicide/Harm Yourself At This time? No  Have you Recently Had Thoughts About Hurting Someone [name]? No  Are You Planning To Harm Someone At This Time? No  Physical Abuse Denies  Verbal Abuse Denies  Sexual Abuse Denies  Exploitation of patient/patient's resources Denies  Self-Neglect Denies  Are you currently experiencing any auditory, visual or other hallucinations? No  Have You Used Any Alcohol or Drugs in the Past 24 Hours? Yes  What Did You Use and How Much? last night, cocaine (unsure of the amount, but alot) and beer (2-3 cans)  Do you have any current medical co-morbidities that require immediate attention? No  Clinician description of patient physical appearance/behavior: neatly dressed, calm, cooperative  What Do You Feel Would Help You the Most Today? Alcohol or Drug Use Treatment  If access to [facility] was not available, would you have sought care in the Emergency Department? No  Determination of Need Routine (7 days)  Options For Referral Medication Management;Facility-Based Crisis;Outpatient Therapy;Chemical Dependency Intensive Outpatient Therapy (CDIOP)

## 2023-05-21 NOTE — ED Notes (Signed)
Report given to RN [email or web address]  adult unit

## 2023-05-21 NOTE — Tx Team (Signed)
Initial Treatment Plan 05/21/2023 11:22 PM Jonathan [name] [id number]    PATIENT STRESSORS: Educational concerns   Depression, Drug abuse  PATIENT STRENGTHS: Ability for insight    PATIENT IDENTIFIED PROBLEMS:                      DISCHARGE CRITERIA:  Ability to meet basic life and health needs  PRELIMINARY DISCHARGE PLAN: Attend aftercare/continuing care group  PATIENT/FAMILY INVOLVEMENT: This treatment plan has been presented to and reviewed with the patient, Jonathan [name], and/or family member,  The patient and family have been given the opportunity to ask questions and make suggestions.  Mathews Argyle, RN 05/21/2023, 11:22 PM

## 2023-05-21 NOTE — ED Notes (Signed)
Pt laying in bed calm and cooperative no pain or distress alert and orient x 4 denies SI/HI/AVH will continue to monitor for safety

## 2023-05-21 NOTE — BH Assessment (Signed)
Comprehensive Clinical Assessment (CCA) Note  05/21/2023 Jonathan [name] [id number]  DISPOSITION: Per Vernard Gambles NP pt is recommended for Inpatient psychiatric treatment  The patient demonstrates the following risk factors for suicide: Chronic risk factors for suicide include: psychiatric disorder of MDD and substance use disorder. Acute risk factors for suicide include: unemployment, social withdrawal/isolation, and loss (financial, interpersonal, professional). Protective factors for this patient include: hope for the future. Considering these factors, the overall suicide risk at this point appears to be high. Patient is appropriate for outpatient follow up.   Per Triage assessment: "Jonathan [name] presents to [facility] voluntarily unaccompanied. Pt states that he has a problem with cocaine and alcohol. Pt currently denies SI, HI, and AVH. Pt states that he used cocaine (unsure of the amount, but alot) and beer (2-3 cans) last night. Pt states that he would like to get into a detox or rehab program. "  With additional assessment: Pt is a 28 yo male who presented voluntarily and unaccompanied due to worsening depression with Si and an expressed desire to detox from cocaine. Pt stated that he has a hx of superficial cutting  and fears he would cut too deep with his SI. Pt stated that he did not feel safe if he left [facility] today and stated that he thought he might "get high and cut himself."  Pt denied current HI, AVH and paranoia. No current OP psychiatric providers.    Chief Complaint:  Chief Complaint  Patient presents with   Alcohol Problem   Addiction Problem   Visit Diagnosis:  MDD, Recurrent, Severe Alcohol Use d/o Stimulant Use d/o, Cocaine Cannabis use d/o    CCA Screening, Triage and Referral (STR)  Patient Reported Information How did you hear about [place]? Self  What Is the Reason for Your Visit/Call Today? Jonathan [name] presents to [facility] voluntarily unaccompanied. Pt states  that he has a problem with cocaine and alcohol. Pt currently denies SI, HI, and AVH. Pt states that he used cocaine (unsure of the amount, but alot) and beer (2-3 cans) last night. Pt states that he would like to get into a detox or rehab program.  How Long Has This Been Causing You Problems? > than 6 months  What Do You Feel Would Help You the Most Today? Alcohol or Drug Use Treatment   Have You Recently Had Any Thoughts About Hurting Yourself? No  Are You Planning to Commit Suicide/Harm Yourself At This time? No   Flowsheet Row ED from 05/21/2023 in [facility] ED from [facility]/25/2023 in [facility] ED from [facility]/31/20[facility]  C-SSRS RISK CATEGORY No Risk No Risk No Risk       Have you Recently Had Thoughts About Hurting Someone Jonathan [name]? No  Are You Planning to Harm Someone at This Time? No  Explanation: Pt reports thoughts of self-harm by cutting and denies current suicide plan or intent. He denies thoughts of harming others.   Have You Used Any Alcohol or Drugs in the Past 24 Hours? Yes  How Long Ago Did You Use Drugs or Alcohol? "Not sure" What Did You Use and How Much? last night, cocaine (unsure of the amount, but alot) and beer (2-3 cans)   Do You Currently Have a Therapist/Psychiatrist? No  Name of Therapist/Psychiatrist:    Have You Been Recently Discharged From Any Office Practice or Programs? No  Explanation of Discharge From Practice/Program: Discharged from [facility] 11/21/2022  CCA Screening Triage Referral Assessment Type of Contact: Face-to-Face  Telemedicine Service Delivery:   Is this Initial or Reassessment?   Date Telepsych consult ordered in [facility]:    Time Telepsych consult ordered in [facility]:    Location of Assessment: [facility]  Provider Location: [facility] Assessment Services   Collateral Involvement: Medical record   Does  Patient Have a Automotive engineer Guardian? No  Legal Guardian Contact Information: na  Copy of Legal Guardianship Form: -- (na)  Legal Guardian Notified of Arrival: -- (na)  Legal Guardian Notified of Pending Discharge: -- (na)  If Minor and Not Living with Parent(s), Who has Custody? adult  Is CPS involved or ever been involved? -- (none reported)  Is APS involved or ever been involved? -- (none reported)   Patient Determined To Be At Risk for Harm To Self or Others Based on Review of Patient Reported Information or Presenting Complaint? Yes, for Self-Harm  Method: Plan without intent  Availability of Means: Has close by  Intent: Vague intent or NA  Notification Required: No need or identified person  Additional Information for Danger to Others Potential: -- (na)  Additional Comments for Danger to Others Potential: Pt denies history of violence  Are There Guns or Other Weapons in Your Home? No  Types of Guns/Weapons: Pt denies access to firearms  Are These Weapons Safely Secured?                            -- (na)  Who Could Verify You Are Able To Have These Secured: Pt denies access to firearms  Do You Have any Outstanding Charges, Pending Court Dates, Parole/Probation? None reported currently  Contacted To Inform of Risk of Harm To Self or Others: -- (na)    Does Patient Present under Involuntary Commitment? No    [place] of Residence: [place]   Patient Currently Receiving the Following Services: Not Receiving Services   Determination of Need: Emergent (2 hours) (Per Vernard Gambles NP pt is recommended for Inpatient psychiatric treatment)   Options For Referral: Inpatient Hospitalization     CCA Biopsychosocial Patient Reported Schizophrenia/Schizoaffective Diagnosis in Past: No   Strengths: Pt is motivated for treatment   Mental Health Symptoms Depression:  Change in energy/activity; Difficulty Concentrating; Fatigue; Hopelessness;  Increase/decrease in appetite; Irritability; Sleep (too much or little); Worthlessness   Duration of Depressive symptoms: Duration of Depressive Symptoms: Greater than two weeks   Mania:  None   Anxiety:   Difficulty concentrating; Fatigue; Irritability; Sleep; Tension; Worrying   Psychosis:  None   Duration of Psychotic symptoms:    Trauma:  None   Obsessions:  None   Compulsions:  None   Inattention:  None   Hyperactivity/Impulsivity:  None   Oppositional/Defiant Behaviors:  None   Emotional Irregularity:  None   Other Mood/Personality Symptoms:  None noted    Mental Status Exam Appearance and self-care  Stature:  Average   Weight:  Average weight   Clothing:  Casual   Grooming:  Normal   Cosmetic use:  None   Posture/gait:  Normal   Motor activity:  Not Remarkable   Sensorium  Attention:  Normal   Concentration:  Normal   Orientation:  X5   Recall/memory:  Normal   Affect and Mood  Affect:  Appropriate   Mood:  Depressed   Relating  Eye contact:  Normal   Facial expression:  Depressed   Attitude  toward examiner:  Cooperative   Thought and Language  Speech flow: Normal   Thought content:  Appropriate to Mood and Circumstances   Preoccupation:  None   Hallucinations:  None   Organization:  Coherent   [employer] of Knowledge:  Average   Intelligence:  Average   Abstraction:  Normal   Judgement:  Fair; Impaired   Reality Testing:  Adequate   Insight:  Fair; Lacking   Decision Making:  Vacilates   Social Functioning  Social Maturity:  Isolates   Social Judgement:  Normal   Stress  Stressors:  Housing; Surveyor, quantity; Work   Coping Ability:  Contractor Deficits:  None   Supports:  Support needed     Religion: Religion/Spirituality Are You A Religious Person?: No How Might This Affect Treatment?: NA  Leisure/Recreation: Leisure / Recreation Do You Have Hobbies?: Yes Leisure and Hobbies:  Watching sports  Exercise/Diet: Exercise/Diet Do You Exercise?: Yes What Type of Exercise Do You Do?: Weight Training How Many Times a Week Do You Exercise?: 1-3 times a week Have You Gained or Lost A Significant Amount of Weight in the Past Six Months?: No Do You Follow a Special Diet?: No Do You Have Any Trouble Sleeping?: Yes Explanation of Sleeping Difficulties: Pt reports he does not sleep well.   CCA Employment/Education Employment/Work Situation: Employment / Work Situation Employment Situation: Unemployed Patient's Job has Been Impacted by Current Illness: No Has Patient ever Been in Equities trader?: No  Education: Education Is Patient Currently Attending School?: No Last Grade Completed: 11 (GED) Did You Attend College?: No Did You Have An Individualized Education Program (IIEP): No Did You Have Any Difficulty At School?: No   CCA Family/Childhood History Family and Relationship History: Family history Marital status: Single Does patient have children?: Yes How many children?: 1 How is patient's relationship with their children?: One son has been taken from Pt's custody and is with child's mother.  Childhood History:  Childhood History By whom was/is the patient raised?: Father Did patient suffer any verbal/emotional/physical/sexual abuse as a child?: No Has patient ever been sexually abused/assaulted/raped as an adolescent or adult?: No Witnessed domestic violence?: No Has patient been affected by domestic violence as an adult?: No       CCA Substance Use Alcohol/Drug Use: Alcohol / Drug Use Pain Medications: Denies abuse Prescriptions: Denies abuse Over the Counter: Denies abuse History of alcohol / drug use?: Yes Longest period of sobriety (when/how long): Less than one week Negative Consequences of Use: Financial, Personal relationships, Work / Programmer, multimedia Withdrawal Symptoms: None (None reported) Substance #1 Name of Substance 1: Hx of alcohol use 1  - Age of First Use: teen 1 - Amount (size/oz): varies 1 - Frequency: regularly, weekly 1 - Duration: ongoing 1 - Last Use / Amount: "not sure" 1 - Method of Aquiring: unknown 1- Route of Use: drink, oral Substance #2 Name of Substance 2: Hx of cocaine use 2 - Age of First Use: 20s 2 - Amount (size/oz): varies 2 - Frequency: regularly, weekly 2 - Duration: ongoing 2 - Last Use / Amount: "not sure" 2 - Method of Aquiring: unknown 2 - Route of Substance Use: smoke Substance #3 Name of Substance 3: Marijuana 3 - Age of First Use: Adolescent 3 - Amount (size/oz): Varies 3 - Frequency: 2-3 times per month 3 - Duration: ongoing 3 - Last Use / Amount: 2 days ago 3 - Method of Aquiring: unknown 3 - Route of Substance Use:  smoke                   ASAM's:  Six Dimensions of Multidimensional Assessment  Dimension 1:  Acute Intoxication and/or Withdrawal Potential:   Dimension 1:  Description of individual's past and current experiences of substance use and withdrawal: Pt reports frequent use of cocaine, alcohol, and marijuana  Dimension 2:  Biomedical Conditions and Complications:   Dimension 2:  Description of patient's biomedical conditions and  complications: None  Dimension 3:  Emotional, Behavioral, or Cognitive Conditions and Complications:  Dimension 3:  Description of emotional, behavioral, or cognitive conditions and complications: Pt has diagnosis of major depressive disorder and history of self-harm  Dimension 4:  Readiness to Change:  Dimension 4:  Description of Readiness to Change criteria: Pt says he is motivated for treatment  Dimension 5:  Relapse, Continued use, or Continued Problem Potential:  Dimension 5:  Relapse, continued use, or continued problem potential critiera description: Pt has very short episodes of sobriety  Dimension 6:  Recovery/Living Environment:  Dimension 6:  Recovery/Iiving environment criteria description: Pt is homeless and unsheltered  ASAM  Severity Score: ASAM's Severity Rating Score: 11  ASAM Recommended Level of Treatment: ASAM Recommended Level of Treatment: Level III Residential Treatment   Substance use Disorder (SUD) Substance Use Disorder (SUD)  Checklist Symptoms of Substance Use: Continued use despite having a persistent/recurrent physical/psychological problem caused/exacerbated by use, Continued use despite persistent or recurrent social, interpersonal problems, caused or exacerbated by use, Large amounts of time spent to obtain, use or recover from the substance(s), Persistent desire or unsuccessful efforts to cut down or control use, Presence of craving or strong urge to use, Recurrent use that results in a failure to fulfill major role obligations (work, school, home), Social, occupational, recreational activities given up or reduced due to use, Substance(s) often taken in larger amounts or over longer times than was intended  Recommendations for Services/Supports/Treatments: Recommendations for Services/Supports/Treatments Recommendations For Services/Supports/Treatments: Residential-Level 1  Disposition Recommendation per psychiatric provider: We recommend inpatient psychiatric hospitalization when medically cleared. Patient is under voluntary admission status at this time; please IVC if attempts to leave hospital.   DSM5 Diagnoses: Patient Active Problem List   Diagnosis Date Noted   Substance induced mood disorder (HCC) 12/31/2022   Polysubstance abuse (HCC) 12/31/2022   Mild alcohol use disorder 11/18/2022   Insomnia 11/18/2022   MDD (major depressive disorder), recurrent episode (HCC) 07/30/2022   Alcohol use disorder 01/20/2022   Delta-9-tetrahydrocannabinol (THC) dependence (HCC) 01/20/2022   Cocaine use disorder (HCC) 01/20/2022   Pneumonia 05/23/2017   MDD (major depressive disorder), recurrent severe, without psychosis (HCC) 05/22/2017     Referrals to Alternative Service(s): Referred to  Alternative Service(s):   Place:   Date:   Time:    Referred to Alternative Service(s):   Place:   Date:   Time:    Referred to Alternative Service(s):   Place:   Date:   Time:    Referred to Alternative Service(s):   Place:   Date:   Time:     Jonathan [name], Counselor

## 2023-05-21 NOTE — Discharge Instructions (Addendum)
Transfer to [facility], Dr. Abbott Pao is the accepting MD

## 2023-05-21 NOTE — ED Notes (Addendum)
Patient admitted to [facility]  with concerns of going to Rehab for substance use(Cocaine and ETOH). He denies SI/HI/AVH. He reports feeling sfe here at [facility]. He is calm and guarded on approach but makes his need known.  Pt is cooperative with admission process and skin check. No contraband found. No lice. Denies withdrawal S/S and his last use was "Last Night". UDS pending.Pt was oriented to unit. Meal offered and accepted and he had no other concerns

## 2023-05-21 NOTE — Progress Notes (Signed)
Patient in voluntarily for increased depression and SI with plan to cut his wrists. Patient reports using 3.5gms of cocaine per day and drinking 2-3 beers, 1-2 days per week. He had been living with his cousin and reports ongoing "activity" in the home that contributed to his drug use. Recently he has been feeling increasing depressed and left the cousin's home this morning, and came to hospital for suicidal thoughts. 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 He denies having family other than cousin, and young son and  names his girlfriend as his support. Patient stated that he wanted to work on his depression and anxiety during his stay. Patient was calm, logical, and made fair eye contact. He appeared depressed with flat affect. He denies hallucinations or paranoia. Reports history of self harm, evidenced by old scars to legs and thighs, denies previous attempts at suicide. Admission information discussed with patient, understanding verbalized. Patient oriented to the unit, shown to his room.

## 2023-05-22 ENCOUNTER — Encounter (HOSPITAL_COMMUNITY): Payer: Self-pay

## 2023-05-22 DIAGNOSIS — F1994 Other psychoactive substance use, unspecified with psychoactive substance-induced mood disorder: Secondary | ICD-10-CM

## 2023-05-22 LAB — FOLATE: Folate: 5 ng/mL — ABNORMAL LOW (ref >5.9)

## 2023-05-22 MED ORDER — VITAMIN D (ERGOCALCIFEROL) 1.25 MG (50000 UNIT) PO CAPS
50000.0000 [IU] | ORAL_CAPSULE | ORAL | Status: DC
  Administered 2023-05-22: 50000 [IU] via ORAL
  Filled 2023-05-22 (×3): qty 1

## 2023-05-22 NOTE — H&P (Addendum)
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  Everything is my stressor."  History of Present Illness: Jonathan [name] is a 28 yo A A male with prior psychiatric diagnoses significant for MDD, GAD, cocaine use disorder, mild alcohol use disorder, delta 9 tetrahydrocannabinol dependence, and insomnia.  Patient present voluntarily to [facility] from [facility] for worsening depression resulting in suicidal thought in the context of polysubstance usage of cocaine and Delta-9-tetrahydrocannabinol (THC). After medical evaluation/stabilization & clearance, he was transferred to the [facility] for further psychiatric evaluation & treatments.   During this evaluation, Jonathan [name] reports he has a longstanding history of substance use. He received treatment at [facility] residential 01/2023. Once he completed program he immediately relapsed and has been using cocaine almost daily since that time. He reports snorting up to a "8 ball" per day depending on his finances and accessibility. His last cocaine use was yesterday morning. He endorses occasional alcohol use, 2-3 beers per week, with last use on 05/20/23. Reports he only drinks alcohol maybe a couple times per week. He does not consider it to be a problem, however, he is focused on being detox from cocaine and requesting long term residential substance abuse treatment.   Patient reports that his depressive symptoms  worsened in the past 2 weeks leading to the thought of cutting himself.  He endorses worsening insomnia, anhedonia, feelings of guilt about losing his apartment and living with a friend, substance use, and not being able to see his son as a result of his son's mother not allowing him to do so related to his substance abuse.  Patient reports decrease concentration, fatigue, as well as psychomotor retardation where he has been wanting to just lie around all day and poor motivation.  He also reports worsening feelings of hopelessness, worthlessness, helplessness.  Patient also reports having a lot of anxiety, worrying a lot, along with muscle tension related to the anxiety.  Denies panic attacks or history of this in the past.  He denies symptoms of OCD, mania, or psychosis.  Objective:  During encounter with patient, he presents alert, calm, and oriented to person, place, time, and situation.  Initially, did not want to cooperate with this assessment, then patient warms up later and started answering assessment questions.  Presents with flat affect and depressed mood.  Chart reviewed and findings shared with the treatment team and consult with attending psychiatrist.  Attention to personal hygiene and grooming is fair, eye contact is good, speech is clear & coherent. Thought contents are organized and logical, and pt currently denies SI/HI/AVH or paranoia. There is no evidence of delusional thoughts.     Mode of transport to Hospital: Safe transport Current Outpatient (Home) Medication List: See home medication listing PRN medication prior to evaluation: See medication listing  ED course: Patient was received from behavioral health urgent care.  Admission labs were obtained and EKG obtained and analyzed. Collateral Information: Collateral information not obtained at this time POA/Legal Guardian: Patient is his own legal guardian  Past Psychiatric Hx: Previous Psych Diagnoses: Substance-induced mood  behavior, MDD recurrent severe without psychosis, alcohol use disorder, delta 9 THC dependence, cocaine use disorder, mild alcohol use disorder, insomnia, polysubstance abuse. Prior inpatient treatment: X 4, in 2017, 2019, 2023, 2024 at [facility]. Current/prior outpatient treatment: Denies Prior rehab hx: Yes at [facility] Psychotherapy hx: Yes History of suicide: Yes x 1 History of homicide or aggression: Denies Psychiatric medication history: Yes, hydroxyzine, nicotine, sertraline, trazodone. Psychiatric medication compliance history: Noncompliance Neuromodulation history: Denies neuro modulation Current Psychiatrist: Denies Current therapist: Denies  Substance Abuse Hx: Alcohol: Occasional alcohol consumption.  2 glasses of alcohol per month Tobacco: Endorses tobacco smoking 1 pack daily Illicit drugs: Report cocaine use 8 balls per day Rx drug abuse: Was treated at [facility] in 2024 for 2 weeks Rehab hx: Yes at [facility] in 2024  Past Medical History: Medical Diagnoses: History of pneumonia Home Rx: None Prior Hosp: None Prior Surgeries/Trauma: Denies Head trauma, LOC, concussions, seizures: Denies Allergies: No known drug allergies LMP: Not applicable Contraception: Not applicable PCP: Denies  Family History: No pertinent family history Medical: Denies Psych: Denies Psych Rx: Denies SA/HA: Denies Substance use family hx: Denies  Social History: Childhood (bring, raised, lives now, parents, siblings, schooling, education): GED Abuse: Denies history of abuse Marital Status: Single Sexual orientation: Male from birth Children: 1 child Employment: Employed in a Tourist information centre manager Group: Denies Housing: Lives with a friend Finances: Some financial difficulties Legal: Has a court date coming up for Advertising account planner: Denies affiliation with the [employer]  Associated Signs/Symptoms: Depression Symptoms:  depressed mood, anhedonia, fatigue, feelings of  worthlessness/guilt, hopelessness, anxiety, (Hypo) Manic Symptoms:  Irritable Mood, Anxiety Symptoms:  Excessive Worry, Psychotic Symptoms:   Not applicable PTSD Symptoms: NA Total Time spent with patient: 1 hour  Is the patient at risk to self? Yes.    Has the patient been a risk to self in the past 6 months? Yes.    Has the patient been a risk to self within the distant past? Yes.    Is the patient a risk to others? No.  Has the patient been a risk to others in the past 6 months? No.  Has the patient been a risk to others within the distant past? No.   [place] Scale:  Flowsheet Row [facility] Most recent reading at 05/21/2023 10:46 PM ED from 05/21/2023 in [facility] Most recent reading at 05/21/2023  1:53 PM ED from 02/25/2023 in [facility] at [facility] Most recent reading at 02/25/2023 11:23 AM  C-SSRS RISK CATEGORY Low Risk No Risk No Risk      Alcohol Screening: 1. How often do you have a drink containing alcohol?: Monthly or less 2. How many drinks containing alcohol do you have on a typical day when you are drinking?: 1 or 2 3. How often do you have six or more drinks on one occasion?: Never AUDIT-C Score: 1 4. How often during the last year have you found that you were not able to stop drinking once you had started?: Never 5. How often during the last year have you failed to do what was normally expected from you because of drinking?: Never 6. How often during the last year have you needed a first drink in the morning to get yourself going after a heavy drinking session?: Never 7. How often during the last year have you had a feeling of  guilt of remorse after drinking?: Never 8. How often during the last year have you been unable to remember what happened the night before because you had been drinking?: Never 9. Have you or someone else been  injured as a result of your drinking?: No 10. Has a relative or friend or a doctor or another health worker been concerned about your drinking or suggested you cut down?: No Alcohol Use Disorder Identification Test Final Score (AUDIT): 1 Alcohol Brief Interventions/Follow-up: Alcohol education/Brief advice  Substance Abuse History in the last 12 months:  Yes.   Consequences of Substance Abuse: Discussed with patient during this admission evaluation. Medical Consequences:  Liver damage, Possible death by overdose Legal Consequences:  Arrests, jail time, Loss of driving privilege. Family Consequences:  Family discord, divorce and or separation.  Previous Psychotropic Medications: Yes  Psychological Evaluations: Yes  Past Medical History:  Past Medical History:  Diagnosis Date   Bipolar 1 disorder (HCC)    History reviewed. No pertinent surgical history. Family History: History reviewed. No pertinent family history. Family Psychiatric  History: Patient denies Tobacco Screening:  Social History   Tobacco Use  Smoking Status Every Day   Current packs/day: 1.00   Types: Cigarettes  Smokeless Tobacco Never    BH Tobacco Counseling     Are you interested in Tobacco Cessation Medications?  Yes, implement Nicotene Replacement Protocol Counseled patient on smoking cessation:  Yes Reason Tobacco Screening Not Completed: No value filed.    Social History:  Social History   Substance and Sexual Activity  Alcohol Use Yes   Alcohol/week: 2.0 standard drinks of alcohol   Types: 2 Cans of beer per week   Comment: Occ     Social History   Substance and Sexual Activity  Drug Use Yes   Types: Marijuana, Cocaine    Additional Social History:  Allergies:  Not on File Lab Results:  Results for orders placed or performed during the hospital encounter of 05/21/23 (from the past 48 hours)  CBC with Differential/Platelet     Status: Abnormal   Collection Time: 05/21/23 11:50 AM  Result  Value Ref Range   WBC 13.3 (H) 4.0 - 10.5 K/uL   RBC 5.78 4.22 - 5.81 MIL/uL   Hemoglobin 17.8 (H) 13.0 - 17.0 g/dL   HCT 16.1 09.6 - 04.5 %   MCV 90.0 80.0 - 100.0 fL   MCH 30.8 26.0 - 34.0 pg   MCHC 34.2 30.0 - 36.0 g/dL   RDW 40.9 81.1 - 91.4 %   Platelets 287 150 - 400 K/uL   nRBC 0.0 0.0 - 0.2 %   Neutrophils Relative % 77 %   Neutro Abs 10.0 (H) 1.7 - 7.7 K/uL   Lymphocytes Relative 16 %   Lymphs Abs 2.2 0.7 - 4.0 K/uL   Monocytes Relative 6 %   Monocytes Absolute 0.8 0.1 - 1.0 K/uL   Eosinophils Relative 1 %   Eosinophils Absolute 0.2 0.0 - 0.5 K/uL   Basophils Relative 0 %   Basophils Absolute 0.1 0.0 - 0.1 K/uL   Immature Granulocytes 0 %   Abs Immature Granulocytes 0.04 0.00 - 0.07 K/uL    Comment: Performed at [facility], [address], [place], [place] [address]  Comprehensive metabolic panel     Status: Abnormal   Collection Time: 05/21/23 11:50 AM  Result Value Ref Range   Sodium 137 135 - 145 mmol/L   Potassium 4.1 3.5 - 5.1 mmol/L   Chloride 102  98 - 111 mmol/L   CO2 21 (L) 22 - 32 mmol/L   Glucose, Bld 65 (L) 70 - 99 mg/dL    Comment: Glucose reference range applies only to samples taken after fasting for at least 8 hours.   BUN 7 6 - 20 mg/dL   Creatinine, Ser 2.70 0.61 - 1.24 mg/dL   Calcium 8.9 8.9 - 35.0 mg/dL   Total Protein 7.1 6.5 - 8.1 g/dL   Albumin 4.2 3.5 - 5.0 g/dL   AST 22 15 - 41 U/L   ALT 17 0 - 44 U/L   Alkaline Phosphatase 32 (L) 38 - 126 U/L   Total Bilirubin 0.8 0.0 - 1.2 mg/dL   GFR, Estimated >09 >38 mL/min    Comment: (NOTE) Calculated using the CKD-EPI Creatinine Equation (2021)    Anion gap 14 5 - 15    Comment: Performed at [facility], [address], [place], [place] [address]  Hemoglobin A1c     Status: None   Collection Time: 05/21/23 11:50 AM  Result Value Ref Range   Hgb A1c MFr Bld 5.0 4.8 - 5.6 %    Comment: (NOTE) Pre diabetes:          5.7%-6.4%  Diabetes:              >6.4%  Glycemic control  for   <7.0% adults with diabetes    Mean Plasma Glucose 96.8 mg/dL    Comment: Performed at [facility], [address] [place], [place] [address]  Magnesium     Status: None   Collection Time: 05/21/23 11:50 AM  Result Value Ref Range   Magnesium 2.0 1.7 - 2.4 mg/dL    Comment: Performed at [facility], [address], [place], [place] [address]  Ethanol     Status: None   Collection Time: 05/21/23 11:50 AM  Result Value Ref Range   Alcohol, Ethyl (B) <10 <10 mg/dL    Comment: (NOTE) Lowest detectable limit for serum alcohol is 10 mg/dL.  For medical purposes only. Performed at [facility], [address] [place], [place] [address]   Lipid panel     Status: Abnormal   Collection Time: 05/21/23 11:50 AM  Result Value Ref Range   Cholesterol 192 0 - 200 mg/dL   Triglycerides 510 (H) <150 mg/dL   HDL 35 (L) >25 mg/dL   Total CHOL/HDL Ratio 5.5 RATIO   VLDL 35 0 - 40 mg/dL   LDL Cholesterol 852 (H) 0 - 99 mg/dL    Comment:        Total Cholesterol/HDL:CHD Risk Coronary Heart Disease Risk Table                     Men   Women  1/2 Average Risk   3.4   3.3  Average Risk       5.0   4.4  2 X Average Risk   9.6   7.1  3 X Average Risk  23.4   11.0        Use the calculated Patient Ratio above and the CHD Risk Table to determine the patient's CHD Risk.        ATP III CLASSIFICATION (LDL):  <100     mg/dL   Optimal  778-242  mg/dL   Near or Above                    Optimal  130-159  mg/dL   Borderline  409-811  mg/dL   High  >914     mg/dL   Very High Performed at [facility], [address] [place], [place] [address]   TSH     Status: None   Collection Time: 05/21/23 11:50 AM  Result Value Ref Range   TSH 0.499 0.350 - 4.500 uIU/mL    Comment: Performed by a 3rd Generation assay with a functional sensitivity of <=0.01 uIU/mL. Performed at [facility], [address] [place], [place] [address]   Vitamin B12     Status: None    Collection Time: 05/21/23 11:50 AM  Result Value Ref Range   Vitamin B-12 283 180 - 914 pg/mL    Comment: (NOTE) This assay is not validated for testing neonatal or myeloproliferative syndrome specimens for Vitamin B12 levels. Performed at [facility], [address] [place], [place] [address]   POCT Urine Drug Screen - (I-Screen)     Status: Abnormal   Collection Time: 05/21/23 12:38 PM  Result Value Ref Range   POC Amphetamine UR None Detected NONE DETECTED (Cut Off Level 1000 ng/mL)   POC Secobarbital (BAR) None Detected NONE DETECTED (Cut Off Level 300 ng/mL)   POC Buprenorphine (BUP) None Detected NONE DETECTED (Cut Off Level 10 ng/mL)   POC Oxazepam (BZO) None Detected NONE DETECTED (Cut Off Level 300 ng/mL)   POC Cocaine UR Positive (A) NONE DETECTED (Cut Off Level 300 ng/mL)   POC Methamphetamine UR None Detected NONE DETECTED (Cut Off Level 1000 ng/mL)   POC Morphine None Detected NONE DETECTED (Cut Off Level 300 ng/mL)   POC Methadone UR None Detected NONE DETECTED (Cut Off Level 300 ng/mL)   POC Oxycodone UR None Detected NONE DETECTED (Cut Off Level 100 ng/mL)   POC Marijuana UR Positive (A) NONE DETECTED (Cut Off Level 50 ng/mL)  Urinalysis, Complete w Microscopic -Urine, Clean Catch     Status: Abnormal   Collection Time: 05/21/23 12:38 PM  Result Value Ref Range   Color, Urine YELLOW YELLOW   APPearance CLEAR CLEAR   Specific Gravity, Urine 1.011 1.005 - 1.030   pH 6.0 5.0 - 8.0   Glucose, UA NEGATIVE NEGATIVE mg/dL   Hgb urine dipstick NEGATIVE NEGATIVE   Bilirubin Urine NEGATIVE NEGATIVE   Ketones, ur NEGATIVE NEGATIVE mg/dL   Protein, ur NEGATIVE NEGATIVE mg/dL   Nitrite NEGATIVE NEGATIVE   Leukocytes,Ua NEGATIVE NEGATIVE   RBC / HPF 0-5 0 - 5 RBC/hpf   WBC, UA 0-5 0 - 5 WBC/hpf   Bacteria, UA RARE (A) NONE SEEN   Squamous Epithelial / HPF 0-5 0 - 5 /HPF   Mucus PRESENT     Comment: Performed at [facility], [address] [place],  [place] [address]   Blood Alcohol level:  Lab Results  Component Value Date   ETH <10 05/21/2023   ETH <10 12/29/2022   Metabolic Disorder Labs:  Lab Results  Component Value Date   HGBA1C 5.0 05/21/2023   MPG 96.8 05/21/2023   MPG 111.15 11/20/2022   No results found for: "PROLACTIN" Lab Results  Component Value Date   CHOL 192 05/21/2023   TRIG 174 (H) 05/21/2023   HDL 35 (L) 05/21/2023   CHOLHDL 5.5 05/21/2023   VLDL 35 05/21/2023   LDLCALC 122 (H) 05/21/2023   LDLCALC 125 (H) 12/29/2022   Current Medications: Current Facility-Administered Medications  Medication Dose Route Frequency Provider Last  Rate Last Admin   acetaminophen (TYLENOL) tablet 650 mg  650 mg Oral Q6H PRN Ardis Hughs, NP       acetaminophen (TYLENOL) tablet 650 mg  650 mg Oral Q6H PRN Ardis Hughs, NP       alum & mag hydroxide-simeth (MAALOX/MYLANTA) 200-200-20 MG/5ML suspension 30 mL  30 mL Oral Q4H PRN Ardis Hughs, NP       alum & mag hydroxide-simeth (MAALOX/MYLANTA) 200-200-20 MG/5ML suspension 30 mL  30 mL Oral Q4H PRN Ardis Hughs, NP       haloperidol (HALDOL) tablet 5 mg  5 mg Oral TID PRN Ardis Hughs, NP       And   diphenhydrAMINE (BENADRYL) capsule 50 mg  50 mg Oral TID PRN Ardis Hughs, NP       haloperidol (HALDOL) tablet 5 mg  5 mg Oral TID PRN Ardis Hughs, NP       And   diphenhydrAMINE (BENADRYL) capsule 50 mg  50 mg Oral TID PRN Ardis Hughs, NP       haloperidol lactate (HALDOL) injection 5 mg  5 mg Intramuscular TID PRN Ardis Hughs, NP       And   diphenhydrAMINE (BENADRYL) injection 50 mg  50 mg Intramuscular TID PRN Ardis Hughs, NP       And   LORazepam (ATIVAN) injection 2 mg  2 mg Intramuscular TID PRN Ardis Hughs, NP       haloperidol lactate (HALDOL) injection 10 mg  10 mg Intramuscular TID PRN Ardis Hughs, NP       And   diphenhydrAMINE (BENADRYL) injection 50 mg  50 mg Intramuscular TID PRN Ardis Hughs, NP       And   LORazepam (ATIVAN) injection 2 mg  2 mg Intramuscular TID PRN Ardis Hughs, NP       haloperidol lactate (HALDOL) injection 5 mg  5 mg Intramuscular TID PRN Ardis Hughs, NP       And   diphenhydrAMINE (BENADRYL) injection 50 mg  50 mg Intramuscular TID PRN Ardis Hughs, NP       And   LORazepam (ATIVAN) injection 2 mg  2 mg Intramuscular TID PRN Ardis Hughs, NP       haloperidol lactate (HALDOL) injection 10 mg  10 mg Intramuscular TID PRN Ardis Hughs, NP       And   diphenhydrAMINE (BENADRYL) injection 50 mg  50 mg Intramuscular TID PRN Ardis Hughs, NP       And   LORazepam (ATIVAN) injection 2 mg  2 mg Intramuscular TID PRN Ardis Hughs, NP       hydrOXYzine (ATARAX) tablet 25 mg  25 mg Oral TID PRN Ardis Hughs, NP       hydrOXYzine (ATARAX) tablet 25 mg  25 mg Oral Q6H PRN Ardis Hughs, NP       hydrOXYzine (ATARAX) tablet 25 mg  25 mg Oral TID PRN Ardis Hughs, NP       hydrOXYzine (ATARAX) tablet 25 mg  25 mg Oral Q6H PRN Ardis Hughs, NP       loperamide (IMODIUM) capsule 2-4 mg  2-4 mg Oral PRN Ardis Hughs, NP       loperamide (IMODIUM) capsule 2-4 mg  2-4 mg Oral PRN Ardis Hughs, NP       LORazepam (ATIVAN) tablet 1 mg  1 mg Oral Q6H PRN Ardis Hughs, NP       LORazepam (ATIVAN) tablet 1 mg  1 mg Oral Q6H PRN Ardis Hughs, NP       magnesium hydroxide (MILK OF MAGNESIA) suspension 30 mL  30 mL Oral Daily PRN Ardis Hughs, NP       magnesium hydroxide (MILK OF MAGNESIA) suspension 30 mL  30 mL Oral Daily PRN Ardis Hughs, NP       multivitamin with minerals tablet 1 tablet  1 tablet Oral Daily Ardis Hughs, NP   1 tablet at 05/22/23 0827   ondansetron (ZOFRAN-ODT) disintegrating tablet 4 mg  4 mg Oral Q6H PRN Ardis Hughs, NP       ondansetron (ZOFRAN-ODT) disintegrating tablet 4 mg  4 mg Oral Q6H PRN Ardis Hughs, NP        sertraline (ZOLOFT) tablet 50 mg  50 mg Oral Daily Vernard Gambles H, NP   50 mg at 05/22/23 0827   thiamine (Vitamin B-1) tablet 100 mg  100 mg Oral Daily Ardis Hughs, NP   100 mg at 05/22/23 0827   traZODone (DESYREL) tablet 50 mg  50 mg Oral QHS PRN Ardis Hughs, NP       Vitamin D (Ergocalciferol) (DRISDOL) 1.25 MG (50000 UNIT) capsule 50,000 Units  50,000 Units Oral Q7 days Golda Acre, MD   50,000 Units at 05/22/23 1308   PTA Medications: No medications prior to admission.   Musculoskeletal: Strength & Muscle Tone: within normal limits Gait & Station: normal Patient leans: N/A       Psychiatric Specialty Exam:  Presentation  General Appearance:  Appropriate for Environment; Casual; Fairly Groomed  Eye Contact: Fair  Speech: Clear and Coherent  Speech Volume: Normal  Handedness: Right  Mood and Affect  Mood: Anxious; Depressed; Hopeless  Affect: Congruent  Thought Process  Thought Processes: Coherent  Duration of Psychotic Symptoms:N/A Past Diagnosis of Schizophrenia or Psychoactive disorder: No  Descriptions of Associations:Intact  Orientation:Full (Time, Place and Person)  Thought Content:Logical  Hallucinations:Hallucinations: None  Ideas of Reference:None  Suicidal Thoughts:Suicidal Thoughts: No SI Passive Intent and/or Plan: -- (denies)  Homicidal Thoughts:Homicidal Thoughts: No  Sensorium  Memory: Immediate Good; Recent Good  Judgment: Fair  Insight: Fair  Art therapist  Concentration: Good  Attention Span: Good  Recall: Fair  Fund of Knowledge: Fair  Language: Good  Psychomotor Activity  Psychomotor Activity: Psychomotor Activity: Normal  Assets  Assets: Communication Skills; Desire for Improvement; Physical Health; Resilience  Sleep  Sleep: Sleep: Good Number of Hours of Sleep: 7.25  Physical Exam: Physical Exam Vitals and nursing note reviewed.  HENT:     Head: Normocephalic.      Nose: Nose normal.     Mouth/Throat:     Mouth: Mucous membranes are moist.     Pharynx: Oropharynx is clear.  Eyes:     Extraocular Movements: Extraocular movements intact.  Cardiovascular:     Rate and Rhythm: Normal rate.     Pulses: Normal pulses.  Pulmonary:     Effort: Pulmonary effort is normal.  Abdominal:     Comments: Deferred  Genitourinary:    Comments: Deferred Musculoskeletal:        General: Normal range of motion.     Cervical back: Normal range of motion.  Skin:    General: Skin is warm.  Neurological:     General: No focal deficit present.     Mental  Status: He is alert and oriented to person, place, and time.  Psychiatric:        Mood and Affect: Mood normal.        Behavior: Behavior normal.        Thought Content: Thought content normal.    Review of Systems  Constitutional:  Negative for chills and fever.  HENT:  Negative for sore throat.   Eyes:  Negative for blurred vision.  Respiratory:  Negative for cough, sputum production, shortness of breath and wheezing.   Cardiovascular:  Negative for chest pain and palpitations.  Gastrointestinal:  Negative for abdominal pain, constipation, diarrhea, heartburn, nausea and vomiting.  Genitourinary:  Negative for dysuria, frequency and urgency.  Musculoskeletal:  Negative for myalgias and neck pain.  Skin:  Negative for itching and rash.  Neurological:  Negative for dizziness, tingling, tremors and headaches.  Endo/Heme/Allergies:        See allergy listing  Psychiatric/Behavioral:  Positive for depression and substance abuse. Negative for hallucinations and suicidal ideas. The patient is nervous/anxious. The patient does not have insomnia.    Blood pressure 109/76, pulse 64, temperature 98.4 F (36.9 C), temperature source Oral, resp. rate 16, height 5\' 8"  (1.727 m), weight 82.8 kg, SpO2 100%. Body mass index is 27.76 kg/m.  Treatment Plan Summary: Daily contact with patient to assess and evaluate  symptoms and progress in treatment and Medication management  Physician Treatment Plan for Primary Diagnosis:  Assessment: Jawaan Adachi is a 28 yo A A male with prior psychiatric diagnoses significant for MDD, GAD, cocaine use disorder, mild alcohol use disorder, delta 9 tetrahydrocannabinol dependence, and insomnia.  Patient present voluntarily to [facility] from [facility] for worsening depression resulting in suicidal thought in the context of polysubstance usage of cocaine and Delta-9-tetrahydrocannabinol (THC).  Substance induced mood disorder (HCC)  Plans: Medications: --Continue Zoloft tablet 50 mg p.o. daily for depression/anxiety --Continue trazodone tablet 50 mg p.o. q. nightly for insomnia --Continue hydroxyzine tablets 25 mg p.o. 3 times daily as needed for anxiety.  Medication for other medical problems: -- Vitamin D 1.25 mg 50,000 units q. 7 days vitamin D deficiency  Ativan detox protocol: See MAR  Agitation protocol: Benadryl capsule 50 mg p.o. or IM 3 times daily as needed agitation   Haldol tablets 5 mg po IM 3 times daily as needed agitation   Lorazepam tablet 2 mg p.o. or IM 3 times daily as needed agitation    Other PRN Medications -Acetaminophen 650 mg every 6 as needed/mild pain -Maalox 30 mL oral every 4 as needed/digestion -Magnesium hydroxide 30 mL daily as needed/mild constipation  --The risks/benefits/side-effects/alternatives to this medication were discussed in detail with the patient and time was given for questions. The patient consents to medication trial.  -- Metabolic profile and EKG monitoring obtained while on an atypical antipsychotic (BMI: Lipid Panel: HbgA1c: QTc:)  -- Encouraged patient to participate in unit milieu and in scheduled group therapies   Admission labs reviewed: CMP: CO2 21 low, glucose 65 low, alkaline phosphatase 32 low, otherwise normal.  Lipid profile: HDL 35 low, LDL 122 high, triglyceride 174 hide, otherwise normal.  CBC with  differential: WBC 13.3 high, hemoglobin 17.8 high, neutrophils 10.0 high, otherwise normal.  Vitamin B12: 283 within normal limits.  TSH: 0.499 within normal limits.  Hemoglobin A1c: 5.0 within normal limits.  UA: Within normal limits.  UDS: Positive for cocaine and marijuana.  New labs ordered: Folate  EKG reviewed: Normal sinus rhythm, ventricular rate 62,  QT/QTc: 394/399.   Safety and Monitoring: Voluntary admission to inpatient psychiatric unit for safety, stabilization and treatment Daily contact with patient to assess and evaluate symptoms and progress in treatment Patient's case to be discussed in multi-disciplinary team meeting Observation Level : q15 minute checks Vital signs: q12 hours Precautions: suicide, but pt currently verbally contracts for safety on unit    Discharge Planning: Social work and case management to assist with discharge planning and identification of hospital follow-up needs prior to discharge Estimated LOS: 5-7 days Discharge Concerns: Need to establish a safety plan; Medication compliance and effectiveness Discharge Goals: Return home with outpatient referrals for mental health follow-up including medication management/psychotherapy.  Long Term Goal(s): Improvement in symptoms so as ready for discharge  Short Term Goals: Ability to identify changes in lifestyle to reduce recurrence of condition will improve, Ability to verbalize feelings will improve, Ability to disclose and discuss suicidal ideas, Ability to demonstrate self-control will improve, Ability to identify and develop effective coping behaviors will improve, Ability to maintain clinical measurements within normal limits will improve, Compliance with prescribed medications will improve, and Ability to identify triggers associated with substance abuse/mental health issues will improve  Physician Treatment Plan for Secondary Diagnosis: Principal Problem:   Substance induced mood disorder (HCC) Active  Problems:   Alcohol use disorder   Delta-9-tetrahydrocannabinol (THC) dependence (HCC)   Cocaine use disorder (HCC)   Mild alcohol use disorder   Insomnia  I certify that inpatient services furnished can reasonably be expected to improve the patient's condition.    Cecilie Lowers, FNP 2/19/20252:59 PM

## 2023-05-22 NOTE — Progress Notes (Signed)
   05/22/23 0900  Psych Admission Type (Psych Patients Only)  Admission Status Voluntary  Psychosocial Assessment  Patient Complaints Substance abuse;Depression;Anxiety  Eye Contact Fair  Facial Expression Flat  Affect Depressed;Flat  Speech Logical/coherent  Interaction Guarded  Motor Activity Slow  Appearance/Hygiene Unremarkable  Behavior Characteristics Cooperative;Appropriate to situation  Mood Depressed  Thought Process  Coherency WDL  Content WDL  Delusions None reported or observed  Perception WDL  Hallucination None reported or observed  Judgment WDL  Confusion WDL  Danger to Self  Current suicidal ideation? Denies  Agreement Not to Harm Self Yes  Description of Agreement verbal  Danger to Others  Danger to Others None reported or observed

## 2023-05-22 NOTE — [facility] Suicide Risk Assessment (Signed)
Suicide Risk Assessment  Admission Assessment    [facility] Admission Suicide Risk Assessment  Nursing information obtained from:  Patient Demographic factors:  Male, Low socioeconomic status Current Mental Status:  Suicidal ideation indicated by patient Loss Factors:  NA Historical Factors:  NA Risk Reduction Factors:  Employed  Total Time spent with patient: 1 hour Principal Problem: Substance induced mood disorder (HCC) Diagnosis:  Principal Problem:   Substance induced mood disorder (HCC)  Subjective Data: Jonathan [name] is a 28 yo A A male with prior psychiatric diagnoses significant for MDD, GAD, cocaine use disorder, mild alcohol use disorder, delta 9 tetrahydrocannabinol dependence, and insomnia.  Patient present voluntarily to [facility] from [facility] for worsening depression resulting in suicidal thought in the context of polysubstance usage of cocaine and Delta-9-tetrahydrocannabinol (THC).  Continued Clinical Symptoms:  Alcohol Use Disorder Identification Test Final Score (AUDIT): 1 The "Alcohol Use Disorders Identification Test", Guidelines for Use in Primary Care, Second Edition.  World Science writer [facility]). Score between 0-7:  no or low risk or alcohol related problems. Score between 8-15:  moderate risk of alcohol related problems. Score between 16-19:  high risk of alcohol related problems. Score 20 or above:  warrants further diagnostic evaluation for alcohol dependence and treatment.  CLINICAL FACTORS:   Depression:   Anhedonia Hopelessness Alcohol/Substance Abuse/Dependencies More than one psychiatric diagnosis Previous Psychiatric Diagnoses and Treatments  Musculoskeletal: Strength & Muscle Tone: within normal limits Gait & Station: normal Patient leans: N/A  Psychiatric Specialty Exam:  Presentation  General Appearance:  Appropriate for Environment; Casual; Fairly Groomed  Eye Contact: Fair  Speech: Clear and Coherent  Speech  Volume: Normal  Handedness: Right  Mood and Affect  Mood: Anxious; Depressed; Hopeless  Affect: Congruent  Thought Process  Thought Processes: Coherent  Descriptions of Associations:Intact  Orientation:Full (Time, Place and Person)  Thought Content:Logical  History of Schizophrenia/Schizoaffective disorder:No  Duration of Psychotic Symptoms:No data recorded Hallucinations:Hallucinations: None  Ideas of Reference:None  Suicidal Thoughts:Suicidal Thoughts: No SI Passive Intent and/or Plan: -- (denies)  Homicidal Thoughts:Homicidal Thoughts: No  Sensorium  Memory: Immediate Good; Recent Good  Judgment: Fair  Insight: Fair  Art therapist  Concentration: Good  Attention Span: Good  Recall: Fair  Fund of Knowledge: Fair  Language: Good   Psychomotor Activity  Psychomotor Activity: Psychomotor Activity: Normal  Assets  Assets: Communication Skills; Desire for Improvement; Physical Health; Resilience  Sleep  Sleep: Sleep: Good Number of Hours of Sleep: 7.25  Physical Exam: Physical Exam Vitals and nursing note reviewed.  HENT:     Head: Normocephalic.     Nose: Nose normal.     Mouth/Throat:     Mouth: Mucous membranes are moist.     Pharynx: Oropharynx is clear.  Eyes:     Extraocular Movements: Extraocular movements intact.  Cardiovascular:     Rate and Rhythm: Normal rate.     Pulses: Normal pulses.  Pulmonary:     Effort: Pulmonary effort is normal.  Abdominal:     Comments: Deferred  Genitourinary:    Comments: Deferred  Musculoskeletal:        General: Normal range of motion.     Cervical back: Normal range of motion.  Skin:    General: Skin is warm.  Neurological:     General: No focal deficit present.     Mental Status: He is alert and oriented to person, place, and time.  Psychiatric:        Mood and Affect: Mood normal.  Behavior: Behavior normal.        Thought Content: Thought content normal.     Review of Systems  Constitutional:  Negative for chills and fever.  HENT:  Negative for sore throat.   Eyes:  Negative for blurred vision.  Respiratory:  Negative for cough, sputum production, shortness of breath and wheezing.   Cardiovascular:  Negative for chest pain and palpitations.  Gastrointestinal:  Negative for abdominal pain, constipation, diarrhea, heartburn, nausea and vomiting.  Genitourinary:  Negative for dysuria, frequency and urgency.  Musculoskeletal:  Negative for myalgias and neck pain.  Skin:  Negative for itching and rash.  Neurological:  Negative for dizziness, tingling, tremors and headaches.  Endo/Heme/Allergies:        See allergy listing  Psychiatric/Behavioral:  Positive for depression and substance abuse. Negative for hallucinations and suicidal ideas. The patient is nervous/anxious. The patient does not have insomnia.    Blood pressure 109/76, pulse 64, temperature 98.4 F (36.9 C), temperature source Oral, resp. rate 16, height 5\' 8"  (1.727 m), weight 82.8 kg, SpO2 100%. Body mass index is 27.76 kg/m.  COGNITIVE FEATURES THAT CONTRIBUTE TO RISK:  Polarized thinking    SUICIDE RISK:   Severe:  Frequent, intense, and enduring suicidal ideation, specific plan, no subjective intent, but some objective markers of intent (i.e., choice of lethal method), the method is accessible, some limited preparatory behavior, evidence of impaired self-control, severe dysphoria/symptomatology, multiple risk factors present, and few if any protective factors, particularly a lack of social support.  PLAN OF CARE: Physician Treatment Plan for Primary Diagnosis:  Assessment: Jonathan [name] is a 28 yo A A male with prior psychiatric diagnoses significant for MDD, GAD, cocaine use disorder, mild alcohol use disorder, delta 9 tetrahydrocannabinol dependence, and insomnia.  Patient present voluntarily to [facility] from [facility] for worsening depression resulting in suicidal thought in  the context of polysubstance usage of cocaine and Delta-9-tetrahydrocannabinol (THC).  Substance induced mood disorder (HCC)  Plans: Medications: --Continue Zoloft tablet 50 mg p.o. daily for depression/anxiety --Continue trazodone tablet 50 mg p.o. q. nightly for insomnia --Continue hydroxyzine tablets 25 mg p.o. 3 times daily as needed for anxiety.  Medication for other medical problems: -- Vitamin D 1.25 mg 50,000 units q. 7 days vitamin D deficiency  Ativan detox protocol: See MAR  Agitation protocol: Benadryl capsule 50 mg p.o. or IM 3 times daily as needed agitation   Haldol tablets 5 mg po IM 3 times daily as needed agitation   Lorazepam tablet 2 mg p.o. or IM 3 times daily as needed agitation    Other PRN Medications -Acetaminophen 650 mg every 6 as needed/mild pain -Maalox 30 mL oral every 4 as needed/digestion -Magnesium hydroxide 30 mL daily as needed/mild constipation  --The risks/benefits/side-effects/alternatives to this medication were discussed in detail with the patient and time was given for questions. The patient consents to medication trial.  -- Metabolic profile and EKG monitoring obtained while on an atypical antipsychotic (BMI: Lipid Panel: HbgA1c: QTc:)  -- Encouraged patient to participate in unit milieu and in scheduled group therapies   Admission labs reviewed: CMP: CO2 21 low, glucose 65 low, alkaline phosphatase 32 low, otherwise normal.  Lipid profile: HDL 35 low, LDL 122 high, triglyceride 174 hide, otherwise normal.  CBC with differential: WBC 13.3 high, hemoglobin 17.8 high, neutrophils 10.0 high, otherwise normal.  Vitamin B12: 283 within normal limits.  TSH: 0.499 within normal limits.  Hemoglobin A1c: 5.0 within normal limits.  UA: Within normal  limits.  UDS: Positive for cocaine and marijuana.  New labs ordered: Folate  EKG reviewed: Normal sinus rhythm, ventricular rate 62, QT/QTc: 394/399.   Safety and Monitoring: Voluntary admission to  inpatient psychiatric unit for safety, stabilization and treatment Daily contact with patient to assess and evaluate symptoms and progress in treatment Patient's case to be discussed in multi-disciplinary team meeting Observation Level : q15 minute checks Vital signs: q12 hours Precautions: suicide, but pt currently verbally contracts for safety on unit    Discharge Planning: Social work and case management to assist with discharge planning and identification of hospital follow-up needs prior to discharge Estimated LOS: 5-7 days Discharge Concerns: Need to establish a safety plan; Medication compliance and effectiveness Discharge Goals: Return home with outpatient referrals for mental health follow-up including medication management/psychotherapy.  Long Term Goal(s): Improvement in symptoms so as ready for discharge  Short Term Goals: Ability to identify changes in lifestyle to reduce recurrence of condition will improve, Ability to verbalize feelings will improve, Ability to disclose and discuss suicidal ideas, Ability to demonstrate self-control will improve, Ability to identify and develop effective coping behaviors will improve, Ability to maintain clinical measurements within normal limits will improve, Compliance with prescribed medications will improve, and Ability to identify triggers associated with substance abuse/mental health issues will improve  Physician Treatment Plan for Secondary Diagnosis: Principal Problem:   Substance induced mood disorder (HCC) Active Problems:   Alcohol use disorder   Delta-9-tetrahydrocannabinol (THC) dependence (HCC)   Cocaine use disorder (HCC)   Mild alcohol use disorder   Insomnia  I certify that inpatient services furnished can reasonably be expected to improve the patient's condition.   Cecilie Lowers, FNP 05/22/2023, 2:29 PM

## 2023-05-22 NOTE — BH IP Treatment Plan (Addendum)
Interdisciplinary Treatment and Diagnostic Plan Update  05/22/2023 Time of Session: 1110AM Jonathan [name] MRN: [id number]  Principal Diagnosis: Substance induced mood disorder (HCC)  Secondary Diagnoses: Principal Problem:   Substance induced mood disorder (HCC)   Current Medications:  Current Facility-Administered Medications  Medication Dose Route Frequency Provider Last Rate Last Admin   acetaminophen (TYLENOL) tablet 650 mg  650 mg Oral Q6H PRN Ardis Hughs, NP       acetaminophen (TYLENOL) tablet 650 mg  650 mg Oral Q6H PRN Ardis Hughs, NP       alum & mag hydroxide-simeth (MAALOX/MYLANTA) 200-200-20 MG/5ML suspension 30 mL  30 mL Oral Q4H PRN Ardis Hughs, NP       alum & mag hydroxide-simeth (MAALOX/MYLANTA) 200-200-20 MG/5ML suspension 30 mL  30 mL Oral Q4H PRN Ardis Hughs, NP       haloperidol (HALDOL) tablet 5 mg  5 mg Oral TID PRN Ardis Hughs, NP       And   diphenhydrAMINE (BENADRYL) capsule 50 mg  50 mg Oral TID PRN Ardis Hughs, NP       haloperidol (HALDOL) tablet 5 mg  5 mg Oral TID PRN Ardis Hughs, NP       And   diphenhydrAMINE (BENADRYL) capsule 50 mg  50 mg Oral TID PRN Ardis Hughs, NP       haloperidol lactate (HALDOL) injection 5 mg  5 mg Intramuscular TID PRN Ardis Hughs, NP       And   diphenhydrAMINE (BENADRYL) injection 50 mg  50 mg Intramuscular TID PRN Ardis Hughs, NP       And   LORazepam (ATIVAN) injection 2 mg  2 mg Intramuscular TID PRN Ardis Hughs, NP       haloperidol lactate (HALDOL) injection 10 mg  10 mg Intramuscular TID PRN Ardis Hughs, NP       And   diphenhydrAMINE (BENADRYL) injection 50 mg  50 mg Intramuscular TID PRN Ardis Hughs, NP       And   LORazepam (ATIVAN) injection 2 mg  2 mg Intramuscular TID PRN Ardis Hughs, NP       haloperidol lactate (HALDOL) injection 5 mg  5 mg Intramuscular TID PRN Ardis Hughs, NP       And    diphenhydrAMINE (BENADRYL) injection 50 mg  50 mg Intramuscular TID PRN Ardis Hughs, NP       And   LORazepam (ATIVAN) injection 2 mg  2 mg Intramuscular TID PRN Ardis Hughs, NP       haloperidol lactate (HALDOL) injection 10 mg  10 mg Intramuscular TID PRN Ardis Hughs, NP       And   diphenhydrAMINE (BENADRYL) injection 50 mg  50 mg Intramuscular TID PRN Ardis Hughs, NP       And   LORazepam (ATIVAN) injection 2 mg  2 mg Intramuscular TID PRN Ardis Hughs, NP       hydrOXYzine (ATARAX) tablet 25 mg  25 mg Oral TID PRN Ardis Hughs, NP       hydrOXYzine (ATARAX) tablet 25 mg  25 mg Oral Q6H PRN Ardis Hughs, NP       hydrOXYzine (ATARAX) tablet 25 mg  25 mg Oral TID PRN Ardis Hughs, NP       hydrOXYzine (ATARAX) tablet 25 mg  25 mg Oral Q6H PRN Ardis Hughs, NP  loperamide (IMODIUM) capsule 2-4 mg  2-4 mg Oral PRN Ardis Hughs, NP       loperamide (IMODIUM) capsule 2-4 mg  2-4 mg Oral PRN Ardis Hughs, NP       LORazepam (ATIVAN) tablet 1 mg  1 mg Oral Q6H PRN Ardis Hughs, NP       LORazepam (ATIVAN) tablet 1 mg  1 mg Oral Q6H PRN Ardis Hughs, NP       magnesium hydroxide (MILK OF MAGNESIA) suspension 30 mL  30 mL Oral Daily PRN Ardis Hughs, NP       magnesium hydroxide (MILK OF MAGNESIA) suspension 30 mL  30 mL Oral Daily PRN Ardis Hughs, NP       multivitamin with minerals tablet 1 tablet  1 tablet Oral Daily Ardis Hughs, NP   1 tablet at 05/22/23 0827   ondansetron (ZOFRAN-ODT) disintegrating tablet 4 mg  4 mg Oral Q6H PRN Ardis Hughs, NP       ondansetron (ZOFRAN-ODT) disintegrating tablet 4 mg  4 mg Oral Q6H PRN Ardis Hughs, NP       sertraline (ZOLOFT) tablet 50 mg  50 mg Oral Daily Vernard Gambles H, NP   50 mg at 05/22/23 0827   thiamine (Vitamin B-1) tablet 100 mg  100 mg Oral Daily Ardis Hughs, NP   100 mg at 05/22/23 0827   traZODone (DESYREL) tablet  50 mg  50 mg Oral QHS PRN Ardis Hughs, NP       Vitamin D (Ergocalciferol) (DRISDOL) 1.25 MG (50000 UNIT) capsule 50,000 Units  50,000 Units Oral Q7 days Golda Acre, MD   50,000 Units at 05/22/23 1610   PTA Medications: No medications prior to admission.    Patient Stressors: Educational concerns    Patient Strengths: Ability for insight   Treatment Modalities: Medication Management, Group therapy, Case management,  1 to 1 session with clinician, Psychoeducation, Recreational therapy.   Physician Treatment Plan for Primary Diagnosis: Substance induced mood disorder (HCC) Long Term Goal(s):     Short Term Goals:    Medication Management: Evaluate patient's response, side effects, and tolerance of medication regimen.  Therapeutic Interventions: 1 to 1 sessions, Unit Group sessions and Medication administration.  Evaluation of Outcomes: Not Progressing  Physician Treatment Plan for Secondary Diagnosis: Principal Problem:   Substance induced mood disorder (HCC)  Long Term Goal(s):     Short Term Goals:       Medication Management: Evaluate patient's response, side effects, and tolerance of medication regimen.  Therapeutic Interventions: 1 to 1 sessions, Unit Group sessions and Medication administration.  Evaluation of Outcomes: Not Progressing   RN Treatment Plan for Primary Diagnosis: Substance induced mood disorder (HCC) Long Term Goal(s): Knowledge of disease and therapeutic regimen to maintain health will improve  Short Term Goals: Ability to remain free from injury will improve, Ability to verbalize frustration and anger appropriately will improve, Ability to demonstrate self-control, Ability to participate in decision making will improve, Ability to verbalize feelings will improve, Ability to disclose and discuss suicidal ideas, Ability to identify and develop effective coping behaviors will improve, and Compliance with prescribed medications will  improve  Medication Management: RN will administer medications as ordered by provider, will assess and evaluate patient's response and provide education to patient for prescribed medication. RN will report any adverse and/or side effects to prescribing provider.  Therapeutic Interventions: 1 on 1 counseling sessions, Psychoeducation, Medication administration, Evaluate responses  to treatment, Monitor vital signs and CBGs as ordered, Perform/monitor CIWA, COWS, AIMS and Fall Risk screenings as ordered, Perform wound care treatments as ordered.  Evaluation of Outcomes: Not Progressing   LCSW Treatment Plan for Primary Diagnosis: Substance induced mood disorder (HCC) Long Term Goal(s): Safe transition to appropriate next level of care at discharge, Engage patient in therapeutic group addressing interpersonal concerns.  Short Term Goals: Engage patient in aftercare planning with referrals and resources, Increase social support, Increase ability to appropriately verbalize feelings, Increase emotional regulation, Facilitate acceptance of mental health diagnosis and concerns, Facilitate patient progression through stages of change regarding substance use diagnoses and concerns, Identify triggers associated with mental health/substance abuse issues, and Increase skills for wellness and recovery  Therapeutic Interventions: Assess for all discharge needs, 1 to 1 time with Social worker, Explore available resources and support systems, Assess for adequacy in community support network, Educate family and significant other(s) on suicide prevention, Complete Psychosocial Assessment, Interpersonal group therapy.  Evaluation of Outcomes: Not Progressing   Progress in Treatment: Attending groups: No. Participating in groups: No. Taking medication as prescribed: Yes. Toleration medication: Yes. Family/Significant other contact made: No, will contact:  consents pending Patient understands diagnosis:  Yes. Discussing patient identified problems/goals with staff: Yes. Medical problems stabilized or resolved: Yes. Denies suicidal/homicidal ideation: Yes.  New problem(s) identified: No, Describe:  none  New Short Term/Long Term Goal(s): detox, medication management for mood stabilization; elimination of SI thoughts; development of comprehensive mental wellness/sobriety plan   Patient Goals:  "Get into [facility] or another substance use treatment place"  Discharge Plan or Barriers: Patient recently admitted. CSW will continue to follow and assess for appropriate referrals and possible discharge planning.    Reason for Continuation of Hospitalization: Depression Medication stabilization Suicidal ideation Withdrawal symptoms  Estimated Length of Stay: 5-7 days  Last 3 [place] Suicide Severity Risk Score: Flowsheet Row [facility]Current[facility] Most recent reading at 05/21/2023 10:46 PM ED from 05/21/2023 in [facility] Most recent reading at 05/21/2023  1:53 PM ED from 02/25/2023 in [facility] at [facility] Most recent reading at 02/25/2023 11:23 AM  C-SSRS RISK CATEGORY Low Risk No Risk No Risk       Last PHQ 2/9 Scores:    01/03/2023   10:29 AM 01/01/2023    1:23 PM 12/30/2022   12:40 AM  Depression screen PHQ 2/9  Decreased Interest 0 1 1  Down, Depressed, Hopeless 0 1 1  PHQ - 2 Score 0 2 2  Altered sleeping 0 3 2  Tired, decreased energy 0 3 2  Change in appetite 0 0 2  Feeling bad or failure about yourself  0 3 2  Trouble concentrating 0 3 2  Moving slowly or fidgety/restless 0 1 1  Suicidal thoughts 0 1 2  PHQ-9 Score 0 16 15  Difficult doing work/chores Not difficult at all Somewhat difficult Very difficult    Scribe for Treatment Team: Kathi Der, LCSWA 05/22/2023 2:19 PM

## 2023-05-22 NOTE — Progress Notes (Signed)
   05/22/23 2200  Psych Admission Type (Psych Patients Only)  Admission Status Voluntary  Psychosocial Assessment  Patient Complaints Substance abuse;Depression  Eye Contact Fair  Facial Expression Flat  Affect Depressed  Speech Logical/coherent  Interaction Guarded  Motor Activity Other (Comment) (WNL)  Appearance/Hygiene Unremarkable  Behavior Characteristics Cooperative  Mood Depressed  Thought Process  Coherency WDL  Content WDL  Delusions None reported or observed  Perception WDL  Hallucination None reported or observed  Judgment WDL  Confusion WDL  Danger to Self  Current suicidal ideation? Denies  Description of Suicide Plan None  Self-Injurious Behavior No self-injurious ideation or behavior indicators observed or expressed   Agreement Not to Harm Self Yes  Description of Agreement Verbal agreement  Danger to Others  Danger to Others None reported or observed

## 2023-05-22 NOTE — Progress Notes (Signed)
   05/21/23 2303  Psych Admission Type (Psych Patients Only)  Admission Status Voluntary  Psychosocial Assessment  Patient Complaints Substance abuse  Eye Contact Fair  Facial Expression Flat  Affect Flat  Speech Logical/coherent  Interaction Avoidant  Motor Activity Slow  Appearance/Hygiene Unremarkable  Behavior Characteristics Cooperative;Appropriate to situation  Mood Depressed  Thought Process  Coherency WDL  Content WDL  Delusions None reported or observed  Perception WDL  Hallucination None reported or observed  Judgment WDL  Confusion WDL  Danger to Self  Current suicidal ideation? Passive  Agreement Not to Harm Self Yes  Description of Agreement verbal  Danger to Others  Danger to Others None reported or observed

## 2023-05-22 NOTE — Progress Notes (Signed)
     05/22/2023       11:54 AM   Jonathan [name]   Type of Note: Substance Use Treatment  Pt reports in tx team this morning being interested in [facility] or [facility] for substance use treatment. Reports leaving [facility] October 2024 without finishing program.  Spoke with Marcelino Duster at [facility] [phone], it has been over 30 days and pt does have [facility]. Pt is eligible to return after referral is faxed and reviewed.  Pt was given number for [facility] as well and was encourage to call to complete phone intake. Pt agreeable.  Will continue to assist.  Signed:  Quashaun Lazalde, LCSW-A 05/22/2023  11:54 AM

## 2023-05-22 NOTE — Plan of Care (Signed)
   Problem: Education: Goal: Knowledge of [facility] Education information/materials will improve Outcome: Progressing Goal: Emotional status will improve Outcome: Progressing Goal: Mental status will improve Outcome: Progressing Goal: Verbalization of understanding the information provided will improve Outcome: Progressing

## 2023-05-22 NOTE — Plan of Care (Signed)
  Problem: Education: Goal: Emotional status will improve Outcome: Progressing Goal: Verbalization of understanding the information provided will improve Outcome: Progressing   Problem: Activity: Goal: Sleeping patterns will improve Outcome: Progressing   Problem: Coping: Goal: Ability to demonstrate self-control will improve Outcome: Progressing   Problem: Physical Regulation: Goal: Ability to maintain clinical measurements within normal limits will improve Outcome: Progressing

## 2023-05-22 NOTE — [facility] Group Notes (Signed)

## 2023-05-22 NOTE — Group Note (Signed)
Date:  05/22/2023 Time:  8:59 AM  Group Topic/Focus:  Developing a Wellness Toolbox:   The focus of this group is to help patients develop a "wellness toolbox" with skills and strategies to promote recovery upon discharge.    Participation Level:  Did Not Attend   Erasmo Score 05/22/2023, 8:59 AM

## 2023-05-23 DIAGNOSIS — F1994 Other psychoactive substance use, unspecified with psychoactive substance-induced mood disorder: Secondary | ICD-10-CM | POA: Diagnosis not present

## 2023-05-23 NOTE — [facility] Suicide Risk Assessment (Signed)
[facility] INPATIENT:  Family/Significant Other Suicide Prevention Education  Suicide Prevention Education:  Patient Refusal for Family/Significant Other Suicide Prevention Education: The patient Jonathan [name] has refused to provide written consent for family/significant other to be provided Family/Significant Other Suicide Prevention Education during admission and/or prior to discharge.  Physician notified.  Kathi Der 05/23/2023, 10:39 AM

## 2023-05-23 NOTE — Group Note (Signed)
Date:  05/23/2023 Time:  8:36 PM  Group Topic/Focus:  Wrap-Up Group:   The focus of this group is to help patients review their daily goal of treatment and discuss progress on daily workbooks.    Participation Level:  Active  Participation Quality:  Appropriate and Attentive  Affect:  Appropriate  Cognitive:  Appropriate  Insight: Appropriate and Good  Engagement in Group:  Engaged  Modes of Intervention:  Discussion  Additional Comments:  Patient stated that he had a great day   Alfonse Ras 05/23/2023, 8:36 PM

## 2023-05-23 NOTE — Plan of Care (Signed)
  Problem: Education: Goal: Emotional status will improve Outcome: Progressing Goal: Verbalization of understanding the information provided will improve Outcome: Progressing   Problem: Activity: Goal: Interest or engagement in activities will improve Outcome: Progressing   Problem: Coping: Goal: Ability to verbalize frustrations and anger appropriately will improve Outcome: Progressing   Problem: Health Behavior/Discharge Planning: Goal: Identification of resources available to assist in meeting health care needs will improve Outcome: Progressing   Problem: Physical Regulation: Goal: Ability to maintain clinical measurements within normal limits will improve Outcome: Progressing   Problem: Activity: Goal: Imbalance in normal sleep/wake cycle will improve Outcome: Progressing   Problem: Role Relationship: Goal: Will demonstrate positive changes in social behaviors and relationships Outcome: Progressing   Problem: Health Behavior/Discharge Planning: Goal: Identification of resources available to assist in meeting health care needs will improve Outcome: Progressing

## 2023-05-23 NOTE — Progress Notes (Signed)
     05/23/2023       1:01 PM   Seanmichael Marca Ancona   Type of Note: [facility]   Spoke with Marcelino Duster at [place], pt was accepted to their facility and is due to admit there on Tuesday 05/28/23 by 9:00AM. Pt needs to arrive with 14 day supply of medications and 30 day scripts. MD and patient aware and agreeable.   Signed:  Ariz Terrones, LCSW-A 05/23/2023  1:01 PM

## 2023-05-23 NOTE — Group Note (Signed)
Date:  05/23/2023 Time:  2:19 PM  Group Topic/Focus:  Self Care:   The focus of this group is to help patients understand the importance of self-care in order to improve or restore emotional, physical, spiritual, interpersonal, and financial health.    Participation Level:  Minimal  Participation Quality:   Limited  Affect:  Appropriate  Cognitive:  Appropriate  Insight: Appropriate  Engagement in Group:  Limited  Modes of Intervention:  Discussion and Education  Additional Comments:    Arnoldo Hooker 05/23/2023, 2:19 PM

## 2023-05-23 NOTE — Progress Notes (Signed)
   05/23/23 2000  Psych Admission Type (Psych Patients Only)  Admission Status Voluntary  Psychosocial Assessment  Patient Complaints Depression;Anxiety  Eye Contact Fair  Facial Expression Flat  Affect Depressed  Speech Logical/coherent  Interaction Guarded  Motor Activity Other (Comment) (WNL)  Appearance/Hygiene Unremarkable  Behavior Characteristics Appropriate to situation  Mood Depressed  Thought Process  Coherency WDL  Content WDL  Delusions None reported or observed  Perception WDL  Hallucination None reported or observed  Judgment WDL  Confusion WDL  Danger to Self  Current suicidal ideation? Denies  Description of Suicide Plan None  Self-Injurious Behavior No self-injurious ideation or behavior indicators observed or expressed   Agreement Not to Harm Self Yes  Description of Agreement Verbal agreement  Danger to Others  Danger to Others None reported or observed

## 2023-05-23 NOTE — Progress Notes (Signed)
   05/23/23 0900  Psych Admission Type (Psych Patients Only)  Admission Status Voluntary  Psychosocial Assessment  Patient Complaints Substance abuse;Depression  Eye Contact Fair  Facial Expression Flat  Affect Flat;Depressed  Speech Logical/coherent  Interaction Guarded  Motor Activity Other (Comment) (wnl)  Appearance/Hygiene Unremarkable  Behavior Characteristics Cooperative  Mood Depressed;Anxious  Thought Process  Coherency WDL  Content WDL  Delusions None reported or observed  Perception WDL  Hallucination None reported or observed  Judgment WDL  Confusion WDL  Danger to Self  Current suicidal ideation? Denies  Agreement Not to Harm Self Yes  Description of Agreement verbal  Danger to Others  Danger to Others None reported or observed

## 2023-05-23 NOTE — Progress Notes (Signed)
[facility] MD Progress Note  05/23/2023 6:26 PM Jonathan [name]  MRN:  [id number]  Principal Problem: Substance induced mood disorder (HCC) Diagnosis: Principal Problem:   Substance induced mood disorder (HCC)  Reason for admission:  Jonathan [name] is a 28 yo A A male with prior psychiatric diagnoses significant for MDD, GAD, cocaine use disorder, mild alcohol use disorder, delta 9 tetrahydrocannabinol dependence, and insomnia.  Patient present voluntarily to [facility] from [facility] for worsening depression resulting in suicidal thought in the context of polysubstance usage of cocaine and Delta-9-tetrahydrocannabinol (THC).   Yesterday the psychiatry team made the following recommendations: --Continue Zoloft tablet 50 mg p.o. daily for depression/anxiety --Continue Trazodone tablet 50 mg p.o. q. nightly for insomnia --Continue Hydroxyzine tablets 25 mg p.o. 3 times daily as needed for anxiety.  Today's assessment notes: On assessment today, the pt reports that his mood is less depressed.  He presents alert, calm, cooperative, and oriented to time, person, place, & situation.  Patient is compliant with his treatment regimen.  Attention to hygiene much improved.  Encouraged to attend therapeutic milieu and unit group activities.  Denies delusional thinking and paranoia.  Further denies SI/HI/AVH.  The LCSW reports that [facility] and will admit there Tuesday 05/28/2023 by 9am. Pt is aware and agreeable.  Reports that anxiety is at manageable level Sleep is good and slept over 8.25 hours last night and being restful Appetite is great Concentration is better compared to admission Energy level is adequate Denies suicidal thoughts.  Denies suicidal intent and plan.  Denies having any HI.  Denies having psychotic symptoms.   Denies having side effects to current psychiatric medications.   We discussed compliance to current medication regimen.   Total Time spent with patient:  45 minutes  Past Psychiatric History: Previous Psych Diagnoses: Substance-induced mood behavior, MDD recurrent severe without psychosis, alcohol use disorder, delta 9 THC dependence, cocaine use disorder, mild alcohol use disorder, insomnia, polysubstance abuse. Prior inpatient treatment: X 4, in 2017, 2019, 2023, 2024 at [facility]. Current/prior outpatient treatment: Denies Prior rehab hx: Yes at [facility] Psychotherapy hx: Yes History of suicide: Yes x 1 History of homicide or aggression: Denies Psychiatric medication history: Yes, hydroxyzine, nicotine, sertraline, trazodone. Psychiatric medication compliance history: Noncompliance Neuromodulation history: Denies neuro modulation Current Psychiatrist: Denies Current therapist: Denies  Past Medical History:  Past Medical History:  Diagnosis Date   Bipolar 1 disorder (HCC)    History reviewed. No pertinent surgical history. Family History: History reviewed. No pertinent family history. Family Psychiatric  History: See H&P Social History:  Social History   Substance and Sexual Activity  Alcohol Use Yes   Alcohol/week: 2.0 standard drinks of alcohol   Types: 2 Cans of beer per week   Comment: Occ     Social History   Substance and Sexual Activity  Drug Use Yes   Types: Marijuana, Cocaine    Social History   Socioeconomic History   Marital status: Single    Spouse name: Not on file   Number of children: Not on file   Years of education: Not on file   Highest education level: Not on file  Occupational History   Not on file  Tobacco Use   Smoking status: Every Day    Current packs/day: 1.00    Types: Cigarettes   Smokeless tobacco: Never  Vaping Use   Vaping status: Never Used  Substance and Sexual Activity   Alcohol use: Yes    Alcohol/week:  2.0 standard drinks of alcohol    Types: 2 Cans of beer per week    Comment: Occ   Drug use: Yes    Types: Marijuana, Cocaine   Sexual activity: Yes    Birth  control/protection: Condom  Other Topics Concern   Not on file  Social History Narrative   ** Merged History Encounter **       Social Drivers of Health   Financial Resource Strain: Not on file  Food Insecurity: No Food Insecurity (05/21/2023)   Hunger Vital Sign    Worried About Running Out of Food in the Last Year: Never true    Ran Out of Food in the Last Year: Never true  Transportation Needs: No Transportation Needs (05/21/2023)   PRAPARE - Administrator, Civil Service (Medical): No    Lack of Transportation (Non-Medical): No  Physical Activity: Not on file  Stress: Not on file (02/07/2023)  Social Connections: Not on file   Additional Social History:    Sleep: Good  Appetite:  Good  Current Medications: Current Facility-Administered Medications  Medication Dose Route Frequency Provider Last Rate Last Admin   acetaminophen (TYLENOL) tablet 650 mg  650 mg Oral Q6H PRN Ardis Hughs, NP       acetaminophen (TYLENOL) tablet 650 mg  650 mg Oral Q6H PRN Ardis Hughs, NP       alum & mag hydroxide-simeth (MAALOX/MYLANTA) 200-200-20 MG/5ML suspension 30 mL  30 mL Oral Q4H PRN Ardis Hughs, NP       alum & mag hydroxide-simeth (MAALOX/MYLANTA) 200-200-20 MG/5ML suspension 30 mL  30 mL Oral Q4H PRN Ardis Hughs, NP       haloperidol (HALDOL) tablet 5 mg  5 mg Oral TID PRN Ardis Hughs, NP       And   diphenhydrAMINE (BENADRYL) capsule 50 mg  50 mg Oral TID PRN Ardis Hughs, NP       haloperidol (HALDOL) tablet 5 mg  5 mg Oral TID PRN Ardis Hughs, NP       And   diphenhydrAMINE (BENADRYL) capsule 50 mg  50 mg Oral TID PRN Ardis Hughs, NP       haloperidol lactate (HALDOL) injection 5 mg  5 mg Intramuscular TID PRN Ardis Hughs, NP       And   diphenhydrAMINE (BENADRYL) injection 50 mg  50 mg Intramuscular TID PRN Ardis Hughs, NP       And   LORazepam (ATIVAN) injection 2 mg  2 mg Intramuscular TID PRN  Ardis Hughs, NP       haloperidol lactate (HALDOL) injection 10 mg  10 mg Intramuscular TID PRN Ardis Hughs, NP       And   diphenhydrAMINE (BENADRYL) injection 50 mg  50 mg Intramuscular TID PRN Ardis Hughs, NP       And   LORazepam (ATIVAN) injection 2 mg  2 mg Intramuscular TID PRN Ardis Hughs, NP       haloperidol lactate (HALDOL) injection 5 mg  5 mg Intramuscular TID PRN Ardis Hughs, NP       And   diphenhydrAMINE (BENADRYL) injection 50 mg  50 mg Intramuscular TID PRN Ardis Hughs, NP       And   LORazepam (ATIVAN) injection 2 mg  2 mg Intramuscular TID PRN Ardis Hughs, NP       haloperidol lactate (HALDOL) injection 10 mg  10 mg Intramuscular TID PRN Ardis Hughs, NP       And   diphenhydrAMINE (BENADRYL) injection 50 mg  50 mg Intramuscular TID PRN Ardis Hughs, NP       And   LORazepam (ATIVAN) injection 2 mg  2 mg Intramuscular TID PRN Ardis Hughs, NP       hydrOXYzine (ATARAX) tablet 25 mg  25 mg Oral TID PRN Ardis Hughs, NP   25 mg at 05/22/23 2120   hydrOXYzine (ATARAX) tablet 25 mg  25 mg Oral Q6H PRN Ardis Hughs, NP       hydrOXYzine (ATARAX) tablet 25 mg  25 mg Oral TID PRN Ardis Hughs, NP       hydrOXYzine (ATARAX) tablet 25 mg  25 mg Oral Q6H PRN Ardis Hughs, NP       loperamide (IMODIUM) capsule 2-4 mg  2-4 mg Oral PRN Ardis Hughs, NP       loperamide (IMODIUM) capsule 2-4 mg  2-4 mg Oral PRN Ardis Hughs, NP       LORazepam (ATIVAN) tablet 1 mg  1 mg Oral Q6H PRN Ardis Hughs, NP       LORazepam (ATIVAN) tablet 1 mg  1 mg Oral Q6H PRN Ardis Hughs, NP       magnesium hydroxide (MILK OF MAGNESIA) suspension 30 mL  30 mL Oral Daily PRN Ardis Hughs, NP       magnesium hydroxide (MILK OF MAGNESIA) suspension 30 mL  30 mL Oral Daily PRN Ardis Hughs, NP       multivitamin with minerals tablet 1 tablet  1 tablet Oral Daily Ardis Hughs, NP    1 tablet at 05/23/23 0806   ondansetron (ZOFRAN-ODT) disintegrating tablet 4 mg  4 mg Oral Q6H PRN Ardis Hughs, NP       ondansetron (ZOFRAN-ODT) disintegrating tablet 4 mg  4 mg Oral Q6H PRN Ardis Hughs, NP       sertraline (ZOLOFT) tablet 50 mg  50 mg Oral Daily Ardis Hughs, NP   50 mg at 05/23/23 1610   thiamine (Vitamin B-1) tablet 100 mg  100 mg Oral Daily Ardis Hughs, NP   100 mg at 05/23/23 9604   traZODone (DESYREL) tablet 50 mg  50 mg Oral QHS PRN Ardis Hughs, NP   50 mg at 05/22/23 2120   Vitamin D (Ergocalciferol) (DRISDOL) 1.25 MG (50000 UNIT) capsule 50,000 Units  50,000 Units Oral Q7 days Golda Acre, MD   50,000 Units at 05/22/23 5409    Lab Results:  Results for orders placed or performed during the hospital encounter of 05/21/23 (from the past 48 hours)  Folate     Status: Abnormal   Collection Time: 05/22/23  6:28 PM  Result Value Ref Range   Folate 5.0 (L) >5.9 ng/mL    Comment: Performed at [facility], [address], [place], [place] [address]    Blood Alcohol level:  Lab Results  Component Value Date   [facility] <10 05/21/2023   ETH <10 12/29/2022    Metabolic Disorder Labs: Lab Results  Component Value Date   HGBA1C 5.0 05/21/2023   MPG 96.8 05/21/2023   MPG 111.15 11/20/2022   No results found for: "PROLACTIN" Lab Results  Component Value Date   CHOL 192 05/21/2023   TRIG 174 (H) 05/21/2023   HDL 35 (L) 05/21/2023   CHOLHDL 5.5  05/21/2023   VLDL 35 05/21/2023   LDLCALC 122 (H) 05/21/2023   LDLCALC 125 (H) 12/29/2022    Physical Findings: AIMS:  , ,  ,  ,    CIWA:  CIWA-Ar Total: 0 COWS:     Musculoskeletal: Strength & Muscle Tone: within normal limits Gait & Station: normal Patient leans: N/A  Psychiatric Specialty Exam:  Presentation  General Appearance:  Appropriate for Environment; Casual; Fairly Groomed  Eye Contact: Good  Speech: Clear and Coherent  Speech  Volume: Normal  Handedness: Right  Mood and Affect  Mood: Anxious; Depressed  Affect: Congruent  Thought Process  Thought Processes: Coherent; Goal Directed  Descriptions of Associations:Intact  Orientation:Full (Time, Place and Person)  Thought Content:Logical  History of Schizophrenia/Schizoaffective disorder:No  Duration of Psychotic Symptoms:No data recorded Hallucinations:Hallucinations: None  Ideas of Reference:None  Suicidal Thoughts:Suicidal Thoughts: No SI Passive Intent and/or Plan: -- (Denies)  Homicidal Thoughts:Homicidal Thoughts: No  Sensorium  Memory: Immediate Good  Judgment: Fair  Insight: Fair  Art therapist  Concentration: Good  Attention Span: Good  Recall: Fair  Fund of Knowledge: Fair  Language: Good  Psychomotor Activity  Psychomotor Activity: Psychomotor Activity: Normal  Assets  Assets: Communication Skills; Desire for Improvement; Resilience; Physical Health  Sleep  Sleep: Sleep: Good Number of Hours of Sleep: 8.25  Physical Exam: Physical Exam Vitals and nursing note reviewed.  Constitutional:      Appearance: Normal appearance.  HENT:     Head: Normocephalic.     Nose: Nose normal.     Mouth/Throat:     Mouth: Mucous membranes are moist.     Pharynx: Oropharynx is clear.  Eyes:     Extraocular Movements: Extraocular movements intact.  Cardiovascular:     Rate and Rhythm: Normal rate.     Pulses: Normal pulses.  Pulmonary:     Effort: Pulmonary effort is normal.  Abdominal:     Comments: Deferred  Genitourinary:    Comments: Deferred Musculoskeletal:        General: Normal range of motion.     Cervical back: Normal range of motion.  Skin:    General: Skin is warm.  Neurological:     General: No focal deficit present.     Mental Status: He is alert and oriented to person, place, and time.  Psychiatric:        Mood and Affect: Mood normal.        Behavior: Behavior normal.         Thought Content: Thought content normal.    ROS Blood pressure 125/80, pulse 76, temperature 98.6 F (37 C), resp. rate 16, height 5\' 8"  (1.727 m), weight 82.8 kg, SpO2 99%. Body mass index is 27.76 kg/m.  Treatment Plan Summary: Daily contact with patient to assess and evaluate symptoms and progress in treatment and Medication management Plans: Medications: --Continue Zoloft tablet 50 mg p.o. daily for depression/anxiety --Continue trazodone tablet 50 mg p.o. q. nightly for insomnia --Continue hydroxyzine tablets 25 mg p.o. 3 times daily as needed for anxiety.   Medication for other medical problems: -- Vitamin D 1.25 mg 50,000 units q. 7 days vitamin D deficiency   Ativan detox protocol: See MAR   Agitation protocol: Benadryl capsule 50 mg p.o. or IM 3 times daily as needed agitation   Haldol tablets 5 mg po IM 3 times daily as needed agitation   Lorazepam tablet 2 mg p.o. or IM 3 times daily as needed agitation     Other PRN  Medications -Acetaminophen 650 mg every 6 as needed/mild pain -Maalox 30 mL oral every 4 as needed/digestion -Magnesium hydroxide 30 mL daily as needed/mild constipation   --The risks/benefits/side-effects/alternatives to this medication were discussed in detail with the patient and time was given for questions. The patient consents to medication trial.  -- Metabolic profile and EKG monitoring obtained while on an atypical antipsychotic (BMI: Lipid Panel: HbgA1c: QTc:)  -- Encouraged patient to participate in unit milieu and in scheduled group therapies    Admission labs reviewed: CMP: CO2 21 low, glucose 65 low, alkaline phosphatase 32 low, otherwise normal.  Lipid profile: HDL 35 low, LDL 122 high, triglyceride 174 hide, otherwise normal.  CBC with differential: WBC 13.3 high, hemoglobin 17.8 high, neutrophils 10.0 high, otherwise normal.  Vitamin B12: 283 within normal limits.  TSH: 0.499 within normal limits.  Hemoglobin A1c: 5.0 within normal limits.   UA: Within normal limits.  UDS: Positive for cocaine and marijuana.   New labs ordered: Folate awaiting results   EKG reviewed: Normal sinus rhythm, ventricular rate 62, QT/QTc: 394/399.   Safety and Monitoring: Voluntary admission to inpatient psychiatric unit for safety, stabilization and treatment Daily contact with patient to assess and evaluate symptoms and progress in treatment Patient's case to be discussed in multi-disciplinary team meeting Observation Level : q15 minute checks Vital signs: q12 hours Precautions: suicide, but pt currently verbally contracts for safety on unit    Discharge Planning: Social work and case management to assist with discharge planning and identification of hospital follow-up needs prior to discharge Estimated LOS: 5-7 days Discharge Concerns: Need to establish a safety plan; Medication compliance and effectiveness Discharge Goals: Return home with outpatient referrals for mental health follow-up including medication management/psychotherapy.   Long Term Goal(s): Improvement in symptoms so as ready for discharge   Short Term Goals: Ability to identify changes in lifestyle to reduce recurrence of condition will improve, Ability to verbalize feelings will improve, Ability to disclose and discuss suicidal ideas, Ability to demonstrate self-control will improve, Ability to identify and develop effective coping behaviors will improve, Ability to maintain clinical measurements within normal limits will improve, Compliance with prescribed medications will improve, and Ability to identify triggers associated with substance abuse/mental health issues will improve   Physician Treatment Plan for Secondary Diagnosis: Principal Problem:   Substance induced mood disorder (HCC) Active Problems:   Alcohol use disorder   Delta-9-tetrahydrocannabinol (THC) dependence (HCC)   Cocaine use disorder (HCC)   Mild alcohol use disorder   Insomnia   Cecilie Lowers,  FNP 05/23/2023, 6:26 PM

## 2023-05-23 NOTE — [facility] Counselor (Signed)
Adult Comprehensive Assessment  Patient ID: Jonathan [name], male   DOB: 1996-01-03, 28 y.o.   MRN: [id number]  Information Source: Information source: Patient  Current Stressors:  Patient states their primary concerns and needs for treatment are:: "Thoughts of cutting myself, drugs" Patient states their goals for this hospitilization and ongoing recovery are:: "Work on my depression and anxiety, hopefully go to rehab" Educational / Learning stressors: None reported Employment / Job issues: None reported Family Relationships: None reported Surveyor, quantity / Lack of resources (include bankruptcy): "Yeah" Housing / Lack of housing: No housing Physical health (include injuries & life threatening diseases): None reported Social relationships: None reported Substance abuse: "Yes"  Living/Environment/Situation:  Living Arrangements: Other (Comment) Living conditions (as described by patient or guardian): Homeless, [place] around [place] Who else lives in the home?: N/A How long has patient lived in current situation?: 1 year What is atmosphere in current home: Dangerous, Chaotic  Family History:  Marital status: Single Are you sexually active?: No What is your sexual orientation?: Heterosexual Has your sexual activity been affected by drugs, alcohol, medication, or emotional stress?: None Does patient have children?: Yes How many children?: 1 How is patient's relationship with their children?: One son has been taken from Pt's custody and is with child's mother.  Childhood History:  By whom was/is the patient raised?: Father Additional childhood history information: Father and stepmother - patient was 3yo when father took him away, so mother did not know where he was for 15 years. Description of patient's relationship with caregiver when they were a child: "It was fine" Patient's description of current relationship with people who raised him/her: "We have no relationship, my biological mother  passed away" How were you disciplined when you got in trouble as a child/adolescent?: Whooped Does patient have siblings?: Yes Number of Siblings: 3 Description of patient's current relationship with siblings: "It's alright we don't really talk much" Did patient suffer any verbal/emotional/physical/sexual abuse as a child?: No Did patient suffer from severe childhood neglect?: No Has patient ever been sexually abused/assaulted/raped as an adolescent or adult?: No Was the patient ever a victim of a crime or a disaster?: No Witnessed domestic violence?: No Has patient been affected by domestic violence as an adult?: No  Education:  Highest grade of school patient has completed: GED Currently a Consulting civil engineer?: No Learning disability?: No  Employment/Work Situation:   Employment Situation: Employed Where is Patient Currently Employed?: [employer] How Long has Patient Been Employed?: 2 months Are You Satisfied With Your Job?: Yes Do You Work More Than One Job?: No Work Stressors: None reported Patient's Job has Been Impacted by Current Illness: Yes Describe how Patient's Job has Been Impacted: "They just don't know I am here, I need to call them" What is the Longest Time Patient has Held a Job?: 6 months Where was the Patient Employed at that Time?: [employer] Has Patient ever Been in the [employer]?: No  Financial Resources:   Financial resources: Income from employment, Medicaid Does patient have a representative payee or guardian?: No  Alcohol/Substance Abuse:   What has been your use of drugs/alcohol within the last 12 months?: "Cocaine and beer, every other day or daily just depends" If attempted suicide, did drugs/alcohol play a role in this?: Yes Alcohol/Substance Abuse Treatment Hx: Past Tx, Inpatient If yes, describe treatment: [facility] Has alcohol/substance abuse ever caused legal problems?: No  Social Support System:   Conservation officer, nature Support System: Poor Describe Community  Support System: "Girlfriend that's it" Type of  faith/religion: None reported How does patient's faith help to cope with current illness?: None reported  Leisure/Recreation:   Do You Have Hobbies?: Yes Leisure and Hobbies: Watching sports  Strengths/Needs:   What is the patient's perception of their strengths?: "Sports I guess" Patient states they can use these personal strengths during their treatment to contribute to their recovery: None reported Patient states these barriers may affect/interfere with their treatment: None reported Patient states these barriers may affect their return to the community: None reported Other important information patient would like considered in planning for their treatment: Needs therapist and MM  Discharge Plan:   Currently receiving community mental health services: No Patient states concerns and preferences for aftercare planning are: Would like therapist and MM, [place] Patient states they will know when they are safe and ready for discharge when: "When I get into treatment" Does patient have access to transportation?: No Does patient have financial barriers related to discharge medications?: No Patient description of barriers related to discharge medications: None Plan for no access to transportation at discharge: CSW to arrange Plan for living situation after discharge: Inpatient treatment Will patient be returning to same living situation after discharge?: No  Summary/Recommendations:   Summary and Recommendations (to be completed by the evaluator): Jonathan [name] is a 28yo male who is voluntarily admitted to [facility] secondary to [facility] for worsening depression resulting in suicidal thought in the context of polysubstance usage of cocaine and THC. Pt reports stressors as homelessness and substance use. Pt reports being homeless for a year and drug usage increasing due  to circumstances (cocaine, THC, beer every other day, sometimes daily). Pt reports  his goals of being inpatient is getting into a treatment program. Pt interested in [facility] or [facility]. Pt denies AVH, SI, and HI. Pt is not currently following up with a therpist or psychiatrist but is interested in both at discharge. While here, Jonathan [name] can benefit from crisis stabilization, medication management, therapeutic milieu, and referrals for services.   Kathi Der. 05/23/2023

## 2023-05-23 NOTE — Group Note (Signed)
LCSW Group Therapy Note   Group Date: 05/23/2023 Start Time: 1100 End Time: 1200  Participation:  did not attend  Type of Therapy:  Group Therapy   Topic:  Finding Balance: Using Wise Mind for Thoughtful Decisions  Objective: This class aims to help participants understand the concept of [place] Mind and apply it in real-life situations for more balanced, thoughtful decision-making. Participants will gain tools to manage emotions, consider logic, and find a middle ground for healthier responses and outcomes.  Goals: 1.  Understand Wise Mind: Learn the difference between Emotional Mind, Reasonable Mind, and [place] Mind, and how [place] Mind helps balance emotions and logic for thoughtful decisions. 2.  Recognize Emotional and Reasonable Mind: Identify signs of Emotional Mind and Reasonable Mind in personal reactions and how to shift into Wise Mind for balanced responses. 3.  Practice Wise Mind: Engage in scenarios and activities to apply [employer], combining emotions and logic for balanced, thoughtful decisions.  Summary In this class, we explored Wise Mind as the balance between Emotional Mind (impulsive reactions) and Reasonable Mind (logic-focused responses). We discussed how to identify when we're in each state and practiced using Wise Mind to make more balanced, thoughtful decisions in everyday situations, improving decision-making and relationships.  Therapeutic Modalities This class incorporates Dialectical Behavior Therapy (DBT), mindfulness techniques, and emotion regulation strategies to help participants balance emotional and logical responses in their decision-making process.   Alla Feeling, LCSWA 05/23/2023  7:52 PM

## 2023-05-23 NOTE — Plan of Care (Signed)
   Problem: Education: Goal: Knowledge of [facility] Education information/materials will improve Outcome: Progressing Goal: Emotional status will improve Outcome: Progressing Goal: Mental status will improve Outcome: Progressing Goal: Verbalization of understanding the information provided will improve Outcome: Progressing

## 2023-05-24 ENCOUNTER — Encounter (HOSPITAL_COMMUNITY): Payer: Self-pay | Admitting: Psychiatry

## 2023-05-24 DIAGNOSIS — F1994 Other psychoactive substance use, unspecified with psychoactive substance-induced mood disorder: Secondary | ICD-10-CM | POA: Diagnosis not present

## 2023-05-24 LAB — FOLATE: Folate: 6.2 ng/mL (ref >5.9)

## 2023-05-24 MED ORDER — TRAZODONE HCL 50 MG PO TABS
50.0000 mg | ORAL_TABLET | Freq: Every evening | ORAL | Status: DC | PRN
  Administered 2023-05-25 – 2023-05-27 (×3): 50 mg via ORAL
  Filled 2023-05-24 (×3): qty 1

## 2023-05-24 MED ORDER — FOLIC ACID 1 MG PO TABS
1.0000 mg | ORAL_TABLET | Freq: Every day | ORAL | Status: DC
  Administered 2023-05-24 – 2023-05-28 (×5): 1 mg via ORAL
  Filled 2023-05-24 (×7): qty 1

## 2023-05-24 NOTE — Progress Notes (Signed)
   05/24/23 0900  Psych Admission Type (Psych Patients Only)  Admission Status Voluntary  Psychosocial Assessment  Patient Complaints Anxiety;Depression  Eye Contact Fair  Facial Expression Flat  Affect Depressed  Speech Logical/coherent  Interaction Guarded  Motor Activity Other (Comment) (WNL)  Appearance/Hygiene Unremarkable  Behavior Characteristics Appropriate to situation  Mood Depressed;Anxious  Thought Process  Coherency WDL  Content WDL  Delusions None reported or observed  Perception WDL  Hallucination None reported or observed  Judgment WDL  Confusion WDL  Danger to Self  Current suicidal ideation? Denies  Self-Injurious Behavior No self-injurious ideation or behavior indicators observed or expressed   Agreement Not to Harm Self Yes  Description of Agreement verbal  Danger to Others  Danger to Others None reported or observed

## 2023-05-24 NOTE — Progress Notes (Signed)
Patient expressed concerns about feeling drowsy all day and not feeling like himself until dinnertime. Patient does not attend group during the day but does interact more in the afternoon and evening. This Clinical research associate communicated this information with NP.

## 2023-05-24 NOTE — Progress Notes (Signed)
[facility] MD Progress Note  05/24/2023 3:16 PM Adrienne Delay  MRN:  [id number]  Principal Problem: Substance induced mood disorder (HCC) Diagnosis: Principal Problem:   Substance induced mood disorder (HCC)  Reason for Admission per H&P: Habeeb Puertas is a 28 yo A A male with prior psychiatric diagnoses significant for MDD, GAD, cocaine use disorder, mild alcohol use disorder, delta 9 tetrahydrocannabinol dependence, and insomnia.  Patient present voluntarily to [facility] from [name] for worsening depression resulting in suicidal thought in the context of polysubstance usage of cocaine and Delta-9-tetrahydrocannabinol (THC).   Chart reviewed today. Patient discussed at multidisciplinary team meeting. Nursing staff reports he is pleasant but does not interact much. He slept for 8.5 hours. He is compliant with her medication regimen. He received as needed hydroxyzine, trazodone, and Tylenol the previous night, according to nursing record.     Subjective: Patient evaluated face-to-face by this provider.  Upon approach, the patient was observed lying in bed with his roommate present in the room.  He agreed to meet privately with this provider for today's assessment.  The patient describes his mood as "moderate" and rates his depression as a 4 and anxiety as a 6 on a scale of 0-10, with 10 being the most severe.  He reports low energy, adequate concentration and satisfactory appetite.  He states his sleep is adequate with medication (as needed trazodone).  He denies suicidal or homicidal ideation, auditory or visual hallucinations, paranoia, or delusional thinking.  He also denies any medication side effects.  The patient states that his goal for today is to work on his depression.  He states he attends afternoon groups but does not participate in the mornings.  He reports developing coping skills such as reading and journaling.   Objectively, his affect appears flat but pleasant and cooperative. Encouraged  participation in morning group therapy.  Reinforced coping strategies for managing depression and anxiety.  Educated on importance of medication adherence after discharge. A review of his chart confirms that he has been accepted to [facility] residential and is scheduled for admission on Tuesday, February 25, at 9 AM.  The patient is aware of and agreeable to this plan.   Case discussed with attending psychiatrist, Tobias Alexander. Continue current medication regimen.      Total Time spent with patient: 45 minutes  Past Psychiatric History: Previous Psych Diagnoses: Substance-induced mood behavior, MDD recurrent severe without psychosis, alcohol use disorder, delta 9 THC dependence, cocaine use disorder, mild alcohol use disorder, insomnia, polysubstance abuse. Prior inpatient treatment: X 4, in 2017, 2019, 2023, 2024 at [facility]. Current/prior outpatient treatment: Denies Prior rehab hx: Yes at [facility] Psychotherapy hx: Yes History of suicide: Yes x 1 History of homicide or aggression: Denies Psychiatric medication history: Yes, hydroxyzine, nicotine, sertraline, trazodone. Psychiatric medication compliance history: Noncompliance Neuromodulation history: Denies neuro modulation Current Psychiatrist: Denies Current therapist: Denies  Past Medical History:  Past Medical History:  Diagnosis Date   Bipolar 1 disorder (HCC)    Folate deficiency    History reviewed. No pertinent surgical history. Family History: History reviewed. No pertinent family history. Family Psychiatric  History: See H&P Social History:  Social History   Substance and Sexual Activity  Alcohol Use Yes   Alcohol/week: 2.0 standard drinks of alcohol   Types: 2 Cans of beer per week   Comment: Occ     Social History   Substance and Sexual Activity  Drug Use Yes   Types: Marijuana, Cocaine    Social History  Socioeconomic History   Marital status: Single    Spouse name: Not on file   Number of children: Not on  file   Years of education: Not on file   Highest education level: Not on file  Occupational History   Not on file  Tobacco Use   Smoking status: Every Day    Current packs/day: 1.00    Types: Cigarettes   Smokeless tobacco: Never  Vaping Use   Vaping status: Never Used  Substance and Sexual Activity   Alcohol use: Yes    Alcohol/week: 2.0 standard drinks of alcohol    Types: 2 Cans of beer per week    Comment: Occ   Drug use: Yes    Types: Marijuana, Cocaine   Sexual activity: Yes    Birth control/protection: Condom  Other Topics Concern   Not on file  Social History Narrative   ** Merged History Encounter **       Social Drivers of Health   Financial Resource Strain: Not on file  Food Insecurity: No Food Insecurity (05/21/2023)   Hunger Vital Sign    Worried About Running Out of Food in the Last Year: Never true    Ran Out of Food in the Last Year: Never true  Transportation Needs: No Transportation Needs (05/21/2023)   PRAPARE - Administrator, Civil Service (Medical): No    Lack of Transportation (Non-Medical): No  Physical Activity: Not on file  Stress: Not on file (02/07/2023)  Social Connections: Not on file   Additional Social History:    Sleep: Good  Appetite:  Good  Current Medications: Current Facility-Administered Medications  Medication Dose Route Frequency Provider Last Rate Last Admin   acetaminophen (TYLENOL) tablet 650 mg  650 mg Oral Q6H PRN Ardis Hughs, NP   650 mg at 05/23/23 2115   alum & mag hydroxide-simeth (MAALOX/MYLANTA) 200-200-20 MG/5ML suspension 30 mL  30 mL Oral Q4H PRN Ardis Hughs, NP       haloperidol (HALDOL) tablet 5 mg  5 mg Oral TID PRN Ardis Hughs, NP       And   diphenhydrAMINE (BENADRYL) capsule 50 mg  50 mg Oral TID PRN Ardis Hughs, NP       haloperidol (HALDOL) tablet 5 mg  5 mg Oral TID PRN Ardis Hughs, NP       And   diphenhydrAMINE (BENADRYL) capsule 50 mg  50 mg Oral TID  PRN Ardis Hughs, NP       haloperidol lactate (HALDOL) injection 10 mg  10 mg Intramuscular TID PRN Ardis Hughs, NP       And   diphenhydrAMINE (BENADRYL) injection 50 mg  50 mg Intramuscular TID PRN Ardis Hughs, NP       And   LORazepam (ATIVAN) injection 2 mg  2 mg Intramuscular TID PRN Ardis Hughs, NP       haloperidol lactate (HALDOL) injection 5 mg  5 mg Intramuscular TID PRN Ardis Hughs, NP       And   diphenhydrAMINE (BENADRYL) injection 50 mg  50 mg Intramuscular TID PRN Ardis Hughs, NP       And   LORazepam (ATIVAN) injection 2 mg  2 mg Intramuscular TID PRN Ardis Hughs, NP       folic acid (FOLVITE) tablet 1 mg  1 mg Oral Daily Golda Acre, MD   1 mg at 05/24/23 0959   hydrOXYzine (  ATARAX) tablet 25 mg  25 mg Oral Q6H PRN Ardis Hughs, NP   25 mg at 05/23/23 2115   loperamide (IMODIUM) capsule 2-4 mg  2-4 mg Oral PRN Ardis Hughs, NP       LORazepam (ATIVAN) tablet 1 mg  1 mg Oral Q6H PRN Ardis Hughs, NP       magnesium hydroxide (MILK OF MAGNESIA) suspension 30 mL  30 mL Oral Daily PRN Ardis Hughs, NP       multivitamin with minerals tablet 1 tablet  1 tablet Oral Daily Ardis Hughs, NP   1 tablet at 05/24/23 0748   ondansetron (ZOFRAN-ODT) disintegrating tablet 4 mg  4 mg Oral Q6H PRN Ardis Hughs, NP       sertraline (ZOLOFT) tablet 50 mg  50 mg Oral Daily Ardis Hughs, NP   50 mg at 05/24/23 6578   thiamine (Vitamin B-1) tablet 100 mg  100 mg Oral Daily Ardis Hughs, NP   100 mg at 05/24/23 4696   traZODone (DESYREL) tablet 50 mg  50 mg Oral QHS PRN Ardis Hughs, NP   50 mg at 05/23/23 2116   Vitamin D (Ergocalciferol) (DRISDOL) 1.25 MG (50000 UNIT) capsule 50,000 Units  50,000 Units Oral Q7 days Golda Acre, MD   50,000 Units at 05/22/23 2952    Lab Results:  Results for orders placed or performed during the hospital encounter of 05/21/23 (from the past 48 hours)   Folate     Status: Abnormal   Collection Time: 05/22/23  6:28 PM  Result Value Ref Range   Folate 5.0 (L) >5.9 ng/mL    Comment: Performed at [facility], [address], [place], [place] [address]  Folate     Status: None   Collection Time: 05/24/23  6:47 AM  Result Value Ref Range   Folate 6.2 >5.9 ng/mL    Comment: Performed at [facility], [address], [place], [place] [address]    Blood Alcohol level:  Lab Results  Component Value Date   ETH <10 05/21/2023   ETH <10 12/29/2022    Metabolic Disorder Labs: Lab Results  Component Value Date   HGBA1C 5.0 05/21/2023   MPG 96.8 05/21/2023   MPG 111.15 11/20/2022   No results found for: "PROLACTIN" Lab Results  Component Value Date   CHOL 192 05/21/2023   TRIG 174 (H) 05/21/2023   HDL 35 (L) 05/21/2023   CHOLHDL 5.5 05/21/2023   VLDL 35 05/21/2023   LDLCALC 122 (H) 05/21/2023   LDLCALC 125 (H) 12/29/2022    Physical Findings: AIMS:  , ,  ,  ,    CIWA:  CIWA-Ar Total: 0 COWS:     Musculoskeletal: Strength & Muscle Tone: within normal limits Gait & Station: normal Patient leans: N/A  Psychiatric Specialty Exam:  Presentation  General Appearance:  Casual; Neat  Eye Contact: Good  Speech: Clear and Coherent  Speech Volume: Normal  Handedness: Right  Mood and Affect  Mood: Euthymic  Affect: Blunt; Congruent  Thought Process  Thought Processes: Coherent; Goal Directed; Linear  Descriptions of Associations:Intact  Orientation:Full (Time, Place and Person)  Thought Content:Logical  History of Schizophrenia/Schizoaffective disorder:No  Duration of Psychotic Symptoms:No data recorded Hallucinations:Hallucinations: None  Ideas of Reference:None  Suicidal Thoughts:Suicidal Thoughts: No SI Passive Intent and/or Plan: -- (Denies)  Homicidal Thoughts:Homicidal Thoughts: No  Sensorium  Memory: Immediate Good; Recent  Good  Judgment: Fair  Insight:  Fair  Art therapist  Concentration: Good  Attention Span: Good  Recall: Dudley Major of Knowledge: Fair  Language: Good  Psychomotor Activity  Psychomotor Activity: Psychomotor Activity: Normal  Assets  Assets: Communication Skills; Desire for Improvement; Physical Health; Resilience  Sleep  Sleep: Sleep: Good Number of Hours of Sleep: 8.25  Physical Exam: Physical Exam Vitals and nursing note reviewed.  Constitutional:      Appearance: Normal appearance.  HENT:     Head: Normocephalic.     Nose: Nose normal.     Mouth/Throat:     Mouth: Mucous membranes are moist.     Pharynx: Oropharynx is clear.  Eyes:     Extraocular Movements: Extraocular movements intact.  Cardiovascular:     Rate and Rhythm: Normal rate.     Pulses: Normal pulses.  Pulmonary:     Effort: Pulmonary effort is normal.  Abdominal:     Comments: Deferred  Genitourinary:    Comments: Deferred Musculoskeletal:        General: Normal range of motion.     Cervical back: Normal range of motion.  Skin:    General: Skin is warm.  Neurological:     General: No focal deficit present.     Mental Status: He is alert and oriented to person, place, and time.    Review of Systems  Constitutional: Negative.   Respiratory: Negative.    Cardiovascular:  Negative for chest pain.  Gastrointestinal:  Negative for abdominal pain, constipation, diarrhea, nausea and vomiting.  Neurological: Negative.   Psychiatric/Behavioral:  Positive for depression and substance abuse. Negative for hallucinations and suicidal ideas. The patient is nervous/anxious. The patient does not have insomnia.    Blood pressure 127/80, pulse (!) 58, temperature 98 F (36.7 C), temperature source Oral, resp. rate 16, height 5\' 8"  (1.727 m), weight 82.8 kg, SpO2 100%. Body mass index is 27.76 kg/m.  Treatment Plan Summary: Daily contact with patient to assess and evaluate symptoms  and progress in treatment and Medication management Plans: Medications: --Continue Zoloft tablet 50 mg p.o. daily for depression/anxiety --Continue trazodone tablet 50 mg p.o. q. nightly for insomnia --Continue hydroxyzine tablets 25 mg p.o. 3 times daily as needed for anxiety.   Medication for other medical problems: -- Vitamin D 1.25 mg 50,000 units Q 7 days vitamin D deficiency   Ativan detox protocol: See MAR   Agitation protocol: Benadryl capsule 50 mg p.o. or IM 3 times daily as needed agitation   Haldol tablets 5 mg po IM 3 times daily as needed agitation   Lorazepam tablet 2 mg p.o. or IM 3 times daily as needed agitation     Other PRN Medications -Acetaminophen 650 mg every 6 as needed/mild pain -Maalox 30 mL oral every 4 as needed/digestion -Magnesium hydroxide 30 mL daily as needed/mild constipation   --The risks/benefits/side-effects/alternatives to this medication were discussed in detail with the patient and time was given for questions. The patient consents to medication trial.  -- Metabolic profile and EKG monitoring obtained while on an atypical antipsychotic (BMI: Lipid Panel: HbgA1c: QTc:)  -- Encouraged patient to participate in unit milieu and in scheduled group therapies    Admission labs reviewed: CMP: CO2 21 low, glucose 65 low, alkaline phosphatase 32 low, otherwise normal.  Lipid profile: HDL 35 low, LDL 122 high, triglyceride 174 hide, otherwise normal.  CBC with differential: WBC 13.3 high, hemoglobin 17.8 high, neutrophils 10.0 high, otherwise normal.  Vitamin B12: 283 within normal limits.  TSH: 0.499  within normal limits.  Hemoglobin A1c: 5.0 within normal limits.  UA: Within normal limits.  UDS: Positive for cocaine and marijuana.   Labs Reviewed: Folate 6.2  EKG reviewed: Normal sinus rhythm, ventricular rate 62, QT/QTc: 394/399.   Safety and Monitoring: Voluntary admission to inpatient psychiatric unit for safety, stabilization and treatment Daily  contact with patient to assess and evaluate symptoms and progress in treatment Patient's case to be discussed in multi-disciplinary team meeting Observation Level : q15 minute checks Vital signs: q12 hours Precautions: suicide, but pt currently verbally contracts for safety on unit    Discharge Planning: Social work and case management to assist with discharge planning and identification of hospital follow-up needs prior to discharge Estimated LOS: 5-7 days Discharge Concerns: Need to establish a safety plan; Medication compliance and effectiveness Discharge Goals: Return home with outpatient referrals for mental health follow-up including medication management/psychotherapy.   Long Term Goal(s): Improvement in symptoms so as ready for discharge   Short Term Goals: Ability to identify changes in lifestyle to reduce recurrence of condition will improve, Ability to verbalize feelings will improve, Ability to disclose and discuss suicidal ideas, Ability to demonstrate self-control will improve, Ability to identify and develop effective coping behaviors will improve, Ability to maintain clinical measurements within normal limits will improve, Compliance with prescribed medications will improve, and Ability to identify triggers associated with substance abuse/mental health issues will improve   Physician Treatment Plan for Secondary Diagnosis: Principal Problem:   Substance induced mood disorder (HCC) Active Problems:   Alcohol use disorder   Delta-9-tetrahydrocannabinol (THC) dependence (HCC)   Cocaine use disorder (HCC)   Mild alcohol use disorder   Insomnia   Norma Fredrickson, NP 05/24/2023, 3:16 PM Patient ID: [name], male   DOB: 22-Apr-1995, 28 y.o.   MRN: [id number]

## 2023-05-24 NOTE — [facility] Group Notes (Signed)
[facility] Group Notes:  (Nursing/MHT/Case Management/Adjunct)  Date:  05/24/2023  Time:  8:56 PM  Type of Therapy:   Wrap-up group  Participation Level:  Active  Participation Quality:  Appropriate  Affect:  Appropriate  Cognitive:  Appropriate  Insight:  Appropriate  Engagement in Group:  Engaged  Modes of Intervention:  Education  Summary of Progress/Problems: Pt goal to manage depression. Rated day 9/10.   Jonathan [name] 05/24/2023, 8:56 PM

## 2023-05-24 NOTE — Group Note (Signed)
Recreation Therapy Group Note   Group Topic:Team Building  Group Date: 05/24/2023 Start Time: 0930 End Time: 0956 Facilitators: Dailah Opperman-McCall, LRT,CTRS Location: [address] Dayroom   Group Topic: Communication, Team Building, Problem Solving  Goal Area(s) Addresses:  Patient will effectively work with peer towards shared goal.  Patient will identify skills used to make activity successful.  Patient will identify how skills used during activity can be used to reach post d/c goals.   Intervention: STEM Activity  Activity: Stage manager. In teams of 3-5, patients were given 12 plastic drinking straws and an equal length of masking tape. Using the materials provided, patients were asked to build a landing pad to catch a golf ball dropped from approximately 5 feet in the air. All materials were required to be used by the team in their design. LRT facilitated post-activity discussion.  Education: Pharmacist, community, Scientist, physiological, Discharge Planning   Education Outcome: Acknowledges education/In group clarification offered/Needs additional education.    Affect/Mood: N/A   Participation Level: Did not attend    Clinical Observations/Individualized Feedback:      Plan: Continue to engage patient in RT group sessions 2-3x/week.   Jonathan [name], LRT,CTRS 05/24/2023 12:09 PM

## 2023-05-24 NOTE — Plan of Care (Signed)
  Problem: Education: Goal: Knowledge of [facility] Education information/materials will improve Outcome: Progressing Goal: Mental status will improve Outcome: Progressing   Problem: Activity: Goal: Interest or engagement in activities will improve Outcome: Progressing   Problem: Coping: Goal: Ability to verbalize frustrations and anger appropriately will improve Outcome: Progressing   Problem: Health Behavior/Discharge Planning: Goal: Identification of resources available to assist in meeting health care needs will improve Outcome: Progressing   Problem: Physical Regulation: Goal: Ability to maintain clinical measurements within normal limits will improve Outcome: Progressing   Problem: Education: Goal: Utilization of techniques to improve thought processes will improve Outcome: Progressing   Problem: Activity: Goal: Interest or engagement in leisure activities will improve Outcome: Progressing

## 2023-05-25 DIAGNOSIS — F1994 Other psychoactive substance use, unspecified with psychoactive substance-induced mood disorder: Secondary | ICD-10-CM | POA: Diagnosis not present

## 2023-05-25 MED ORDER — SERTRALINE HCL 50 MG PO TABS
50.0000 mg | ORAL_TABLET | Freq: Every day | ORAL | Status: DC
  Administered 2023-05-26 – 2023-05-27 (×2): 50 mg via ORAL
  Filled 2023-05-25 (×5): qty 1

## 2023-05-25 NOTE — Plan of Care (Signed)
 Nurse discussed anxiety, depression and coping skills with patient.

## 2023-05-25 NOTE — Progress Notes (Signed)
Pt did not attend goals group. 

## 2023-05-25 NOTE — Progress Notes (Signed)
 [facility] MD Progress Note  05/25/2023 8:03 AM Jonathan [name]  MRN:  [id number]  Principal Problem: Substance induced mood disorder (HCC) Diagnosis: Principal Problem:   Substance induced mood disorder (HCC)  Reason for Admission per H&P: Jonathan [name] is a 28 yo A A male with prior psychiatric diagnoses significant for MDD, GAD, cocaine use disorder, mild alcohol use disorder, delta 9 tetrahydrocannabinol dependence, and insomnia.  Patient present voluntarily to [facility] from [facility] for worsening depression resulting in suicidal thought in the context of polysubstance usage of cocaine and Delta-9-tetrahydrocannabinol (THC).   Chart reviewed today. Patient discussed at multidisciplinary team meeting. Nursing staff reported he complained of daytime sleepiness and requested Zoloft to be given at bedtime instead of in the morning. He slept for 8.0 hours. He is compliant with her medication regimen. He did not require as needed medication, according to nursing record. Heart rate bradycardic at 56.     Subjective: Patient evaluated face-to-face by this provider.  On today's assessment, the patient reports that he did not sleep well last night, citing difficulty falling asleep due to racing thoughts.  He states that his appetite is 'fine' but describes his energy as low. He reports adequate concentration and active participation in group therapy. The patient states he continues to engage in reading, journaling, and breathing techniques as coping strategies.  He states his goal for today is to work on his depression.  The patient reports daytime drowsiness as a medication side effect.  He believes this is due to Zoloft and prefers to take it at bedtime instead of in the morning. He denies suicidal or homicidal ideation.  Similarly, he denies auditory or visual hallucinations, paranoia, or other psychotic symptoms.   Case discussed with attending psychiatrist, Tobias Alexander. The plan is to modify Zoloft 50 mg from  morning administration to bedtime.  Proceed with discharge to [facility] on Tuesday, February 25, at 9 AM for substance use treatment.  The patient is aware of and agreeable to this plan.   We discussed changes to the current medication regimen: - Modify Zoloft 50 mg from morning administration to daily at bedtime.     Total Time spent with patient: 45 minutes  Past Psychiatric History: Previous Psych Diagnoses: Substance-induced mood behavior, MDD recurrent severe without psychosis, alcohol use disorder, delta 9 THC dependence, cocaine use disorder, mild alcohol use disorder, insomnia, polysubstance abuse. Prior inpatient treatment: X 4, in 2017, 2019, 2023, 2024 at [facility]. Current/prior outpatient treatment: Denies Prior rehab hx: Yes at [facility] Psychotherapy hx: Yes History of suicide: Yes x 1 History of homicide or aggression: Denies Psychiatric medication history: Yes, hydroxyzine, nicotine, sertraline, trazodone. Psychiatric medication compliance history: Noncompliance Neuromodulation history: Denies neuro modulation Current Psychiatrist: Denies Current therapist: Denies  Past Medical History:  Past Medical History:  Diagnosis Date   Bipolar 1 disorder (HCC)    Folate deficiency    History reviewed. No pertinent surgical history. Family History: History reviewed. No pertinent family history. Family Psychiatric  History: See H&P Social History:  Social History   Substance and Sexual Activity  Alcohol Use Yes   Alcohol/week: 2.0 standard drinks of alcohol   Types: 2 Cans of beer per week   Comment: Occ     Social History   Substance and Sexual Activity  Drug Use Yes   Types: Marijuana, Cocaine    Social History   Socioeconomic History   Marital status: Single    Spouse name: Not on file   Number of  children: Not on file   Years of education: Not on file   Highest education level: Not on file  Occupational History   Not on file  Tobacco Use    Smoking status: Every Day    Current packs/day: 1.00    Types: Cigarettes   Smokeless tobacco: Never  Vaping Use   Vaping status: Never Used  Substance and Sexual Activity   Alcohol use: Yes    Alcohol/week: 2.0 standard drinks of alcohol    Types: 2 Cans of beer per week    Comment: Occ   Drug use: Yes    Types: Marijuana, Cocaine   Sexual activity: Yes    Birth control/protection: Condom  Other Topics Concern   Not on file  Social History Narrative   ** Merged History Encounter **       Social Drivers of Health   Financial Resource Strain: Not on file  Food Insecurity: No Food Insecurity (05/21/2023)   Hunger Vital Sign    Worried About Running Out of Food in the Last Year: Never true    Ran Out of Food in the Last Year: Never true  Transportation Needs: No Transportation Needs (05/21/2023)   PRAPARE - Administrator, Civil Service (Medical): No    Lack of Transportation (Non-Medical): No  Physical Activity: Not on file  Stress: Not on file (02/07/2023)  Social Connections: Not on file   Additional Social History:    Sleep: Good  Appetite:  Good  Current Medications: Current Facility-Administered Medications  Medication Dose Route Frequency Provider Last Rate Last Admin   acetaminophen (TYLENOL) tablet 650 mg  650 mg Oral Q6H PRN Ardis Hughs, NP   650 mg at 05/23/23 2115   alum & mag hydroxide-simeth (MAALOX/MYLANTA) 200-200-20 MG/5ML suspension 30 mL  30 mL Oral Q4H PRN Ardis Hughs, NP       haloperidol (HALDOL) tablet 5 mg  5 mg Oral TID PRN Ardis Hughs, NP       And   diphenhydrAMINE (BENADRYL) capsule 50 mg  50 mg Oral TID PRN Ardis Hughs, NP       haloperidol (HALDOL) tablet 5 mg  5 mg Oral TID PRN Ardis Hughs, NP       And   diphenhydrAMINE (BENADRYL) capsule 50 mg  50 mg Oral TID PRN Ardis Hughs, NP       haloperidol lactate (HALDOL) injection 10 mg  10 mg Intramuscular TID PRN Ardis Hughs, NP        And   diphenhydrAMINE (BENADRYL) injection 50 mg  50 mg Intramuscular TID PRN Ardis Hughs, NP       And   LORazepam (ATIVAN) injection 2 mg  2 mg Intramuscular TID PRN Ardis Hughs, NP       haloperidol lactate (HALDOL) injection 5 mg  5 mg Intramuscular TID PRN Ardis Hughs, NP       And   diphenhydrAMINE (BENADRYL) injection 50 mg  50 mg Intramuscular TID PRN Ardis Hughs, NP       And   LORazepam (ATIVAN) injection 2 mg  2 mg Intramuscular TID PRN Ardis Hughs, NP       folic acid (FOLVITE) tablet 1 mg  1 mg Oral Daily Golda Acre, MD   1 mg at 05/24/23 0959   magnesium hydroxide (MILK OF MAGNESIA) suspension 30 mL  30 mL Oral Daily PRN Ardis Hughs, NP  multivitamin with minerals tablet 1 tablet  1 tablet Oral Daily Ardis Hughs, NP   1 tablet at 05/24/23 9629   sertraline (ZOLOFT) tablet 50 mg  50 mg Oral Daily Ardis Hughs, NP   50 mg at 05/24/23 5284   thiamine (Vitamin B-1) tablet 100 mg  100 mg Oral Daily Ardis Hughs, NP   100 mg at 05/24/23 1324   traZODone (DESYREL) tablet 50 mg  50 mg Oral QHS PRN Ajibola, Ene A, NP       Vitamin D (Ergocalciferol) (DRISDOL) 1.25 MG (50000 UNIT) capsule 50,000 Units  50,000 Units Oral Q7 days Golda Acre, MD   50,000 Units at 05/22/23 4010    Lab Results:  Results for orders placed or performed during the hospital encounter of 05/21/23 (from the past 48 hours)  Folate     Status: None   Collection Time: 05/24/23  6:47 AM  Result Value Ref Range   Folate 6.2 >5.9 ng/mL    Comment: Performed at [facility], [address], [place], [place] [address]    Blood Alcohol level:  Lab Results  Component Value Date   ETH <10 05/21/2023   ETH <10 12/29/2022    Metabolic Disorder Labs: Lab Results  Component Value Date   HGBA1C 5.0 05/21/2023   MPG 96.8 05/21/2023   MPG 111.15 11/20/2022   No results found for: "PROLACTIN" Lab Results  Component  Value Date   CHOL 192 05/21/2023   TRIG 174 (H) 05/21/2023   HDL 35 (L) 05/21/2023   CHOLHDL 5.5 05/21/2023   VLDL 35 05/21/2023   LDLCALC 122 (H) 05/21/2023   LDLCALC 125 (H) 12/29/2022    Physical Findings: AIMS:  , ,  ,  ,    CIWA:  CIWA-Ar Total: 0 COWS:     Musculoskeletal: Strength & Muscle Tone: within normal limits Gait & Station: normal Patient leans: N/A  Psychiatric Specialty Exam:  Presentation  General Appearance:  Casual; Neat  Eye Contact: Good  Speech: Clear and Coherent  Speech Volume: Normal  Handedness: Right  Mood and Affect  Mood: Euthymic  Affect: Blunt; Congruent  Thought Process  Thought Processes: Coherent; Goal Directed; Linear  Descriptions of Associations:Intact  Orientation:Full (Time, Place and Person)  Thought Content:Logical  History of Schizophrenia/Schizoaffective disorder:No  Duration of Psychotic Symptoms:No data recorded Hallucinations:Hallucinations: None  Ideas of Reference:None  Suicidal Thoughts:Suicidal Thoughts: No  Homicidal Thoughts:Homicidal Thoughts: No  Sensorium  Memory: Immediate Good; Recent Good  Judgment: Fair  Insight: Fair  Art therapist  Concentration: Good  Attention Span: Good  Recall: Good  Fund of Knowledge: Fair  Language: Good  Psychomotor Activity  Psychomotor Activity: Psychomotor Activity: Normal  Assets  Assets: Communication Skills; Desire for Improvement; Physical Health; Resilience  Sleep  Sleep: Sleep: Good  Physical Exam: Physical Exam Vitals and nursing note reviewed.  Constitutional:      Appearance: Normal appearance.  HENT:     Head: Normocephalic.     Nose: Nose normal.     Mouth/Throat:     Mouth: Mucous membranes are moist.     Pharynx: Oropharynx is clear.  Eyes:     Extraocular Movements: Extraocular movements intact.  Cardiovascular:     Rate and Rhythm: Normal rate.     Pulses: Normal pulses.  Pulmonary:      Effort: Pulmonary effort is normal.  Abdominal:     Comments: Deferred  Genitourinary:    Comments: Deferred Musculoskeletal:  General: Normal range of motion.     Cervical back: Normal range of motion.  Skin:    General: Skin is warm.  Neurological:     General: No focal deficit present.     Mental Status: He is alert and oriented to person, place, and time.    Review of Systems  Constitutional: Negative.   Respiratory: Negative.    Cardiovascular:  Negative for chest pain.  Gastrointestinal:  Negative for abdominal pain, constipation, diarrhea, nausea and vomiting.  Neurological: Negative.   Psychiatric/Behavioral:  Positive for depression and substance abuse. Negative for hallucinations and suicidal ideas. The patient is nervous/anxious. The patient does not have insomnia.    Blood pressure 139/82, pulse (!) 56, temperature 98.3 F (36.8 C), temperature source Oral, resp. rate 18, height 5\' 8"  (1.727 m), weight 82.8 kg, SpO2 100%. Body mass index is 27.76 kg/m.  Treatment Plan Summary: Daily contact with patient to assess and evaluate symptoms and progress in treatment and Medication management Plans: Medications: --Modify Zoloft 50 mg from morning administration to daily at bedtime for depression/anxiety --Continue trazodone tablet 50 mg p.o. q. nightly for insomnia --Continue hydroxyzine tablets 25 mg p.o. 3 times daily as needed for anxiety.   Medication for other medical problems: -- Vitamin D 1.25 mg 50,000 units Q 7 days vitamin D deficiency   Ativan detox protocol: See MAR   Agitation protocol: Benadryl capsule 50 mg p.o. or IM 3 times daily as needed agitation   Haldol tablets 5 mg po IM 3 times daily as needed agitation   Lorazepam tablet 2 mg p.o. or IM 3 times daily as needed agitation     Other PRN Medications -Acetaminophen 650 mg every 6 as needed/mild pain -Maalox 30 mL oral every 4 as needed/digestion -Magnesium hydroxide 30 mL daily as  needed/mild constipation   --The risks/benefits/side-effects/alternatives to this medication were discussed in detail with the patient and time was given for questions. The patient consents to medication trial.  -- Metabolic profile and EKG monitoring obtained while on an atypical antipsychotic (BMI: Lipid Panel: HbgA1c: QTc:)  -- Encouraged patient to participate in unit milieu and in scheduled group therapies    Admission labs reviewed: CMP: CO2 21 low, glucose 65 low, alkaline phosphatase 32 low, otherwise normal.  Lipid profile: HDL 35 low, LDL 122 high, triglyceride 174 hide, otherwise normal.  CBC with differential: WBC 13.3 high, hemoglobin 17.8 high, neutrophils 10.0 high, otherwise normal.  Vitamin B12: 283 within normal limits.  TSH: 0.499 within normal limits.  Hemoglobin A1c: 5.0 within normal limits.  UA: Within normal limits.  UDS: Positive for cocaine and marijuana.   Labs Reviewed: Folate 6.2  EKG reviewed: Normal sinus rhythm, ventricular rate 62, QT/QTc: 394/399.   Safety and Monitoring: Voluntary admission to inpatient psychiatric unit for safety, stabilization and treatment Daily contact with patient to assess and evaluate symptoms and progress in treatment Patient's case to be discussed in multi-disciplinary team meeting Observation Level : q15 minute checks Vital signs: q12 hours Precautions: suicide, but pt currently verbally contracts for safety on unit    Discharge Planning: Social work and case management to assist with discharge planning and identification of hospital follow-up needs prior to discharge Estimated LOS: 5-7 days Discharge Concerns: Need to establish a safety plan; Medication compliance and effectiveness Discharge Goals: Return home with outpatient referrals for mental health follow-up including medication management/psychotherapy.   Long Term Goal(s): Improvement in symptoms so as ready for discharge   Short Term Goals: Ability  to identify changes  in lifestyle to reduce recurrence of condition will improve, Ability to verbalize feelings will improve, Ability to disclose and discuss suicidal ideas, Ability to demonstrate self-control will improve, Ability to identify and develop effective coping behaviors will improve, Ability to maintain clinical measurements within normal limits will improve, Compliance with prescribed medications will improve, and Ability to identify triggers associated with substance abuse/mental health issues will improve   Physician Treatment Plan for Secondary Diagnosis: Principal Problem:   Substance induced mood disorder (HCC) Active Problems:   Alcohol use disorder   Delta-9-tetrahydrocannabinol (THC) dependence (HCC)   Cocaine use disorder (HCC)   Mild alcohol use disorder   Insomnia   Norma Fredrickson, NP 05/25/2023, 8:03 AM Patient ID: [name], male   DOB: 07-11-1995, 28 y.o.   MRN: [id number] Patient ID: Jeremaih Klima, male   DOB: [birth year], 28 y.o.   MRN: [id number]

## 2023-05-25 NOTE — Progress Notes (Signed)
 D:  Patient's self inventory sheet, patient has poor sleep, sleep medicine helpful.  Good appetite, low energy level, good concentration.  Rated depression and anxiety 6, hopeless 4.  Denied SI.  Denied physical problems.  Denied physical pain.  Goal is work on depression.  Plans to read.  Does have discharge plan working on. A:  Medications administered per MD orders.  Emotional support and encouragement given patient. R:  Denied SI and HI, contracts for safety.  Denied A/V hallucinations.  Safety maintained with 15 minute checks.:

## 2023-05-25 NOTE — [facility] Group Notes (Signed)
 [facility] Group Notes:  (Nursing/MHT/Case Management/Adjunct)  Date:  05/25/2023  Time:  10:29 PM  Type of Therapy:   Wrap-up group  Participation Level:  Active  Participation Quality:  Appropriate  Affect:  Appropriate  Cognitive:  Appropriate  Insight:  Appropriate  Engagement in Group:  Engaged  Modes of Intervention:  Education  Summary of Progress/Problems: Pt goal To work on depression. Rated day 9/10.   Jonathan [name] 05/25/2023, 10:29 PM

## 2023-05-25 NOTE — Group Note (Signed)
  [facility] LCSW Group Therapy Note   Group Date: 05/25/2023 Start Time: 1000 End Time: 1100   Type of Therapy/Topic:  Group Therapy:  Emotion Regulation  Participation Level:  Active    Description of Group:    The purpose of this group is to assist patients in learning to regulate negative emotions and experience positive emotions. Patients will be guided to discuss ways in which they have been vulnerable to their negative emotions. These vulnerabilities will be juxtaposed with experiences of positive emotions or situations, and patients challenged to use positive emotions to combat negative ones. Special emphasis will be placed on coping with negative emotions in conflict situations, and patients will process healthy conflict resolution skills.  Therapeutic Goals: Patient will identify two positive emotions or experiences to reflect on in order to balance out negative emotions:  Patient will label two or more emotions that they find the most difficult to experience:  Patient will be able to demonstrate positive conflict resolution skills through discussion or role plays:   Summary of Patient Progress:   The patient shared this negative thoughts with others, and how he leans on his sponsor for help. He practiced CBT re framing in the group using his own negative thoughts.    Therapeutic Modalities:   Cognitive Behavioral Therapy Feelings Identification Dialectical Behavioral Therapy   Laural Roes Mathayus Stanbery, LCSWA 05/25/2023  12:14 PM

## 2023-05-25 NOTE — Progress Notes (Signed)
   05/25/23 0818  Psych Admission Type (Psych Patients Only)  Admission Status Voluntary  Psychosocial Assessment  Patient Complaints Anxiety;Depression  Eye Contact Fair  Facial Expression Flat  Affect Sad  Speech Logical/coherent  Interaction Guarded  Motor Activity Other (Comment) (WNL)  Appearance/Hygiene Unremarkable  Behavior Characteristics Appropriate to situation  Mood Depressed;Anxious  Thought Process  Coherency WDL  Content WDL  Delusions None reported or observed  Perception WDL  Hallucination None reported or observed  Judgment WDL  Confusion WDL  Danger to Self  Current suicidal ideation? Denies  Agreement Not to Harm Self Yes  Description of Agreement Verbal  Danger to Others  Danger to Others None reported or observed

## 2023-05-25 NOTE — Progress Notes (Signed)
   05/25/23 2300  Psych Admission Type (Psych Patients Only)  Admission Status Voluntary  Psychosocial Assessment  Patient Complaints Anxiety;Depression  Eye Contact Fair  Facial Expression Flat  Affect Sad  Speech Logical/coherent  Interaction Guarded  Motor Activity Other (Comment) (WDL)  Appearance/Hygiene Unremarkable  Behavior Characteristics Appropriate to situation  Mood Depressed;Anxious  Thought Process  Coherency WDL  Content WDL  Delusions None reported or observed  Perception WDL  Hallucination None reported or observed  Judgment WDL  Confusion WDL  Danger to Self  Current suicidal ideation? Denies  Agreement Not to Harm Self Yes  Description of Agreement verbal  Danger to Others  Danger to Others None reported or observed

## 2023-05-25 NOTE — Progress Notes (Signed)
 Pt did attend meditation group.

## 2023-05-25 NOTE — Group Note (Signed)
 Date:  05/25/2023 Time:  2:47 PM  Group Topic/Focus:  Self Esteem:   Jonathan [name] can help build self-esteem and confidence through singing, social engagement, and positive reinforcement. It can also help people express themselves and release tension   Participation Level:  Active  Participation Quality:  Appropriate and Attentive  Affect:  Appropriate  Cognitive:  Appropriate  Insight: Improving  Engagement in Group:  Engaged  Modes of Intervention:  Activity, Discussion, Exploration, Rapport Building, Socialization, and Support  Additional Comments:    Shela Nevin 05/25/2023, 2:47 PM

## 2023-05-25 NOTE — Plan of Care (Signed)
  Problem: Coping: Goal: Ability to verbalize frustrations and anger appropriately will improve Outcome: Progressing Goal: Ability to demonstrate self-control will improve Outcome: Progressing   Problem: Physical Regulation: Goal: Ability to maintain clinical measurements within normal limits will improve Outcome: Progressing   Problem: Safety: Goal: Periods of time without injury will increase Outcome: Progressing   Problem: Medication: Goal: Compliance with prescribed medication regimen will improve Outcome: Progressing   Problem: Activity: Goal: Interest or engagement in activities will improve Outcome: Progressing Goal: Sleeping patterns will improve Outcome: Progressing

## 2023-05-26 DIAGNOSIS — F1994 Other psychoactive substance use, unspecified with psychoactive substance-induced mood disorder: Secondary | ICD-10-CM | POA: Diagnosis not present

## 2023-05-26 NOTE — Plan of Care (Signed)

## 2023-05-26 NOTE — [facility] Group Notes (Signed)
 [facility] Group Notes:  (Nursing/MHT/Case Management/Adjunct)  Date:  05/26/2023  Time:  9:02 AM  Type of Therapy:   Goals  Participation Level:  Minimal  Participation Quality:  Inattentive  Affect:  Flat  Cognitive:  Oriented  Insight:  Limited  Engagement in Group:  None  Modes of Intervention:  Orientation  Summary of Progress/Problems:  Jonathan [name] 05/26/2023, 9:02 AM

## 2023-05-26 NOTE — Plan of Care (Signed)
 [place] discussed anxiety, depression and coping skills with patient.

## 2023-05-26 NOTE — Progress Notes (Signed)
 D:  Patient's self inventory sheet, patient sleeps good, sleep medication helpful.  Good appetite, normal energy level, good concentration.  Rated depression and anxiety 4, denied hopeless.  Denied withdrawals.  Denied SI.  Denied physical problems.  Denied physical pain.  Goal is work on depression.  Plans to work on Pharmacologist.  No discharge plans at this time. A:  Medications administered per MD orders.  Emotional support and encouragement given patient. R:  Denied SI and HI, contracts for safety.  Denied A/V hallucinations.  Safety maintained with 15 minute checks. Patient stated he slept 8 hours last night.

## 2023-05-26 NOTE — Group Note (Unsigned)
 Date:  05/27/2023 Time:  12:53 AM  Group Topic/Focus:  Wrap-Up Group:   The focus of this group is to help patients review their daily goal of treatment and discuss progress on daily workbooks.    Participation Level:  Active  Participation Quality:  Appropriate and Sharing  Affect:  Appropriate  Cognitive:  Appropriate  Insight: Appropriate  Engagement in Group:  Engaged  Modes of Intervention:  Discussion and Socialization  Additional Comments:  Patients used wrap up group sheets during group and shared. Patient rated his day a 10/10. Patient goal for today "work on anxiety". Patient did achieve goal. Coping skills the patient finds most helpful, "reading and writing". Something the patient likes about himself, "personality".   Jonathan [name] 05/27/2023, 12:53 AM

## 2023-05-26 NOTE — Progress Notes (Signed)
 [facility] MD Progress Note  05/26/2023 3:19 PM Jonathan [name]  MRN:  [id number]  Principal Problem: Substance induced mood disorder (HCC) Diagnosis: Principal Problem:   Substance induced mood disorder (HCC)  Reason for Admission per H&P: Jonathan [name] is a 28 yo A A male with prior psychiatric diagnoses significant for MDD, GAD, cocaine use disorder, mild alcohol use disorder, delta 9 tetrahydrocannabinol dependence, and insomnia.  Patient present voluntarily to [facility] from [facility] for worsening depression resulting in suicidal thought in the context of polysubstance usage of cocaine and Delta-9-tetrahydrocannabinol (THC).   Chart reviewed today. Patient discussed at multidisciplinary team meeting. Nursing staff reported exhibits a good appetite and no concerns.  He slept for 8.0 hours. He is compliant with routine medication regimen.  He required as needed trazodone, according to nursing record. Vital signs within defined limits.    Subjective: Patient evaluated face-to-face by this provider.  On assessment today, the patient reports that his depression and anxiety symptoms are improving and that his energy levels are also improving.  He states that he is usually asleep at this time of the morning but is awake and actively interacting socially on the unit.  He describes his mood as moderate and states that he had good sleep last night. His appetite is reported as good.  The patient states he is attending group sessions on the unit and reports receiving a coping skill handout.  He states his goals after discharge are to stay clean, manage his anxiety, depression, and stress levels, and avoid certain people, places, and situations. He denies any medication side effects. He denies suicidal or homicidal ideation. Additionally, he denies auditory or visual hallucinations, paranoia, or other psychotic symptoms.  Objectively his mood and affect appear brighter, and he is engaged in treatment. We discussed  the importance of adhering to his medication regimen postdischarge and attending follow-up appointments to mitigate the risk of readmission. The patient verbalized understanding.    Case discussed with the attending psychiatrist, Tobias Alexander. The patient remains stable and engaged in treatment.  Proceed with discharge to [facility] on Tuesday, February 25, at 9 AM for substance use treatment. The patient is aware of and agrees to this plan.   The following medication adjustments were made yesterday: - Modified Zoloft 50 mg from morning administration to daily at bedtime.     Total Time spent with patient: 45 minutes  Past Psychiatric History: Previous Psych Diagnoses: Substance-induced mood behavior, MDD recurrent severe without psychosis, alcohol use disorder, delta 9 THC dependence, cocaine use disorder, mild alcohol use disorder, insomnia, polysubstance abuse. Prior inpatient treatment: X 4, in 2017, 2019, 2023, 2024 at [facility]. Current/prior outpatient treatment: Denies Prior rehab hx: Yes at [facility] Psychotherapy hx: Yes History of suicide: Yes x 1 History of homicide or aggression: Denies Psychiatric medication history: Yes, hydroxyzine, nicotine, sertraline, trazodone. Psychiatric medication compliance history: Noncompliance Neuromodulation history: Denies neuro modulation Current Psychiatrist: Denies Current therapist: Denies  Past Medical History:  Past Medical History:  Diagnosis Date   Bipolar 1 disorder (HCC)    Folate deficiency    History reviewed. No pertinent surgical history. Family History: History reviewed. No pertinent family history. Family Psychiatric  History: See H&P Social History:  Social History   Substance and Sexual Activity  Alcohol Use Yes   Alcohol/week: 2.0 standard drinks of alcohol   Types: 2 Cans of beer per week   Comment: Occ     Social History   Substance and Sexual Activity  Drug  Use Yes   Types: Marijuana, Cocaine     Social History   Socioeconomic History   Marital status: Single    Spouse name: Not on file   Number of children: Not on file   Years of education: Not on file   Highest education level: Not on file  Occupational History   Not on file  Tobacco Use   Smoking status: Every Day    Current packs/day: 1.00    Types: Cigarettes   Smokeless tobacco: Never  Vaping Use   Vaping status: Never Used  Substance and Sexual Activity   Alcohol use: Yes    Alcohol/week: 2.0 standard drinks of alcohol    Types: 2 Cans of beer per week    Comment: Occ   Drug use: Yes    Types: Marijuana, Cocaine   Sexual activity: Yes    Birth control/protection: Condom  Other Topics Concern   Not on file  Social History Narrative   ** Merged History Encounter **       Social Drivers of Health   Financial Resource Strain: Not on file  Food Insecurity: No Food Insecurity (05/21/2023)   Hunger Vital Sign    Worried About Running Out of Food in the Last Year: Never true    Ran Out of Food in the Last Year: Never true  Transportation Needs: No Transportation Needs (05/21/2023)   PRAPARE - Administrator, Civil Service (Medical): No    Lack of Transportation (Non-Medical): No  Physical Activity: Not on file  Stress: Not on file (02/07/2023)  Social Connections: Not on file   Additional Social History:    Sleep: Good  Appetite:  Good  Current Medications: Current Facility-Administered Medications  Medication Dose Route Frequency Provider Last Rate Last Admin   acetaminophen (TYLENOL) tablet 650 mg  650 mg Oral Q6H PRN Ardis Hughs, NP   650 mg at 05/23/23 2115   alum & mag hydroxide-simeth (MAALOX/MYLANTA) 200-200-20 MG/5ML suspension 30 mL  30 mL Oral Q4H PRN Ardis Hughs, NP       haloperidol (HALDOL) tablet 5 mg  5 mg Oral TID PRN Ardis Hughs, NP       And   diphenhydrAMINE (BENADRYL) capsule 50 mg  50 mg Oral TID PRN Ardis Hughs, NP       haloperidol  (HALDOL) tablet 5 mg  5 mg Oral TID PRN Ardis Hughs, NP       And   diphenhydrAMINE (BENADRYL) capsule 50 mg  50 mg Oral TID PRN Ardis Hughs, NP       haloperidol lactate (HALDOL) injection 10 mg  10 mg Intramuscular TID PRN Ardis Hughs, NP       And   diphenhydrAMINE (BENADRYL) injection 50 mg  50 mg Intramuscular TID PRN Ardis Hughs, NP       And   LORazepam (ATIVAN) injection 2 mg  2 mg Intramuscular TID PRN Ardis Hughs, NP       haloperidol lactate (HALDOL) injection 5 mg  5 mg Intramuscular TID PRN Ardis Hughs, NP       And   diphenhydrAMINE (BENADRYL) injection 50 mg  50 mg Intramuscular TID PRN Ardis Hughs, NP       And   LORazepam (ATIVAN) injection 2 mg  2 mg Intramuscular TID PRN Ardis Hughs, NP       folic acid (FOLVITE) tablet 1 mg  1 mg Oral Daily  Golda Acre, MD   1 mg at 05/26/23 1610   magnesium hydroxide (MILK OF MAGNESIA) suspension 30 mL  30 mL Oral Daily PRN Ardis Hughs, NP       multivitamin with minerals tablet 1 tablet  1 tablet Oral Daily Ardis Hughs, NP   1 tablet at 05/26/23 9604   sertraline (ZOLOFT) tablet 50 mg  50 mg Oral QHS Lenzie Montesano H, NP       thiamine (Vitamin B-1) tablet 100 mg  100 mg Oral Daily Vernard Gambles H, NP   100 mg at 05/26/23 0748   traZODone (DESYREL) tablet 50 mg  50 mg Oral QHS PRN Ajibola, Ene A, NP   50 mg at 05/25/23 2048   Vitamin D (Ergocalciferol) (DRISDOL) 1.25 MG (50000 UNIT) capsule 50,000 Units  50,000 Units Oral Q7 days Golda Acre, MD   50,000 Units at 05/22/23 5409    Lab Results:  No results found for this or any previous visit (from the past 48 hours).   Blood Alcohol level:  Lab Results  Component Value Date   ETH <10 05/21/2023   ETH <10 12/29/2022    Metabolic Disorder Labs: Lab Results  Component Value Date   HGBA1C 5.0 05/21/2023   MPG 96.8 05/21/2023   MPG 111.15 11/20/2022   No results found for: "PROLACTIN" Lab  Results  Component Value Date   CHOL 192 05/21/2023   TRIG 174 (H) 05/21/2023   HDL 35 (L) 05/21/2023   CHOLHDL 5.5 05/21/2023   VLDL 35 05/21/2023   LDLCALC 122 (H) 05/21/2023   LDLCALC 125 (H) 12/29/2022    Physical Findings: AIMS:  , ,  ,  ,    CIWA:  CIWA-Ar Total: 0 COWS:     Musculoskeletal: Strength & Muscle Tone: within normal limits Gait & Station: normal Patient leans: N/A  Psychiatric Specialty Exam:  Presentation  General Appearance:  Casual; Neat  Eye Contact: Good  Speech: Clear and Coherent; Normal Rate  Speech Volume: Normal  Handedness: Right  Mood and Affect  Mood: Euthymic  Affect: Blunt; Congruent  Thought Process  Thought Processes: Coherent; Goal Directed; Linear  Descriptions of Associations:Intact  Orientation:Full (Time, Place and Person)  Thought Content:Logical  History of Schizophrenia/Schizoaffective disorder:No  Duration of Psychotic Symptoms:No data recorded Hallucinations:Hallucinations: None   Ideas of Reference:None  Suicidal Thoughts:Suicidal Thoughts: No SI Passive Intent and/or Plan: -- (Denies)   Homicidal Thoughts:Homicidal Thoughts: No   Sensorium  Memory: Immediate Good; Recent Good  Judgment: Fair  Insight: Fair  Art therapist  Concentration: Good  Attention Span: Good  Recall: Good  Fund of Knowledge: Fair  Language: Good  Psychomotor Activity  Psychomotor Activity: Psychomotor Activity: Normal   Assets  Assets: Communication Skills; Desire for Improvement; Physical Health; Resilience; Social Support  Sleep  Sleep: Sleep: Good   Physical Exam: Physical Exam Vitals and nursing note reviewed.  Constitutional:      Appearance: Normal appearance.  HENT:     Head: Normocephalic.  Eyes:     Extraocular Movements: Extraocular movements intact.  Cardiovascular:     Rate and Rhythm: Normal rate.     Pulses: Normal pulses.  Pulmonary:     Effort: Pulmonary  effort is normal.  Abdominal:     Comments: Deferred  Genitourinary:    Comments: Deferred Musculoskeletal:        General: Normal range of motion.     Cervical back: Normal range of motion.  Neurological:  General: No focal deficit present.     Mental Status: He is alert and oriented to person, place, and time.    Review of Systems  Constitutional: Negative.   HENT:  Negative for congestion and sore throat.   Respiratory:  Negative for cough and shortness of breath.   Cardiovascular:  Negative for chest pain and palpitations.  Gastrointestinal:  Negative for abdominal pain, constipation, diarrhea, nausea and vomiting.  Neurological:  Negative for dizziness, tremors, focal weakness, loss of consciousness and headaches.  Psychiatric/Behavioral:  Positive for depression and substance abuse. Negative for hallucinations and suicidal ideas. The patient is nervous/anxious. The patient does not have insomnia.    Blood pressure 127/71, pulse 65, temperature 97.8 F (36.6 C), temperature source Oral, resp. rate 18, height 5\' 8"  (1.727 m), weight 82.8 kg, SpO2 100%. Body mass index is 27.76 kg/m.  Treatment Plan Summary: Daily contact with patient to assess and evaluate symptoms and progress in treatment and Medication management Plans: Medications: --Continue Zoloft 50 mg, oral, daily at bedtime for depression/anxiety --Continue trazodone tablet 50 mg p.o. q. nightly for insomnia --Continue hydroxyzine tablets 25 mg p.o. 3 times daily as needed for anxiety.   Medication for other medical problems: -- Vitamin D 1.25 mg 50,000 units Q 7 days vitamin D deficiency   Ativan detox protocol: See MAR   Agitation protocol: Benadryl capsule 50 mg p.o. or IM 3 times daily as needed agitation   Haldol tablets 5 mg po IM 3 times daily as needed agitation   Lorazepam tablet 2 mg p.o. or IM 3 times daily as needed agitation     Other PRN Medications -Acetaminophen 650 mg every 6 as  needed/mild pain -Maalox 30 mL oral every 4 as needed/digestion -Magnesium hydroxide 30 mL daily as needed/mild constipation   --The risks/benefits/side-effects/alternatives to this medication were discussed in detail with the patient and time was given for questions. The patient consents to medication trial.  -- Metabolic profile and EKG monitoring obtained while on an atypical antipsychotic (BMI: Lipid Panel: HbgA1c: QTc:)  -- Encouraged patient to participate in unit milieu and in scheduled group therapies    Admission labs reviewed: CMP: CO2 21 low, glucose 65 low, alkaline phosphatase 32 low, otherwise normal.  Lipid profile: HDL 35 low, LDL 122 high, triglyceride 174 hide, otherwise normal.  CBC with differential: WBC 13.3 high, hemoglobin 17.8 high, neutrophils 10.0 high, otherwise normal.  Vitamin B12: 283 within normal limits.  TSH: 0.499 within normal limits.  Hemoglobin A1c: 5.0 within normal limits.  UA: Within normal limits.  UDS: Positive for cocaine and marijuana.   Labs Reviewed: Folate 6.2  EKG reviewed: Normal sinus rhythm, ventricular rate 62, QT/QTc: 394/399.   Safety and Monitoring: Voluntary admission to inpatient psychiatric unit for safety, stabilization and treatment Daily contact with patient to assess and evaluate symptoms and progress in treatment Patient's case to be discussed in multi-disciplinary team meeting Observation Level : q15 minute checks Vital signs: q12 hours Precautions: suicide, but pt currently verbally contracts for safety on unit    Discharge Planning: Social work and case management to assist with discharge planning and identification of hospital follow-up needs prior to discharge Estimated LOS: 5-7 days Discharge Concerns: Need to establish a safety plan; Medication compliance and effectiveness Discharge Goals: Return home with outpatient referrals for mental health follow-up including medication management/psychotherapy.   Long Term  Goal(s): Improvement in symptoms so as ready for discharge   Short Term Goals: Ability to identify changes in lifestyle  to reduce recurrence of condition will improve, Ability to verbalize feelings will improve, Ability to disclose and discuss suicidal ideas, Ability to demonstrate self-control will improve, Ability to identify and develop effective coping behaviors will improve, Ability to maintain clinical measurements within normal limits will improve, Compliance with prescribed medications will improve, and Ability to identify triggers associated with substance abuse/mental health issues will improve   Physician Treatment Plan for Secondary Diagnosis: Principal Problem:   Substance induced mood disorder (HCC) Active Problems:   Alcohol use disorder   Delta-9-tetrahydrocannabinol (THC) dependence (HCC)   Cocaine use disorder (HCC)   Mild alcohol use disorder   Insomnia   Norma Fredrickson, NP 05/26/2023, 3:19 PM Patient ID: [name], male   DOB: 1995/10/19, 28 y.o.   MRN: [id number] Patient ID: Bandy Honaker, male   DOB: Jun 15, 1995, 28 y.o.   MRN: [id number] Patient ID: Darryel Diodato, male   DOB: March 11, 1996, 28 y.o.   MRN: [id number]

## 2023-05-26 NOTE — Group Note (Signed)
 Date:  05/26/2023 Time:  2:41 PM  Group Topic/Focus:  Building Self Esteem:   The Focus of this group is helping patients become aware of the effects of self-esteem on their lives, the things they and others do that enhance or undermine their self-esteem, seeing the relationship between their level of self-esteem and the choices they make and learning ways to enhance self-esteem. Managing Feelings:   The focus of this group is to identify what feelings patients have difficulty handling and develop a plan to handle them in a healthier way upon discharge.    Participation Level:  Did Not Attend  Participation Quality:    Affect:    Cognitive:    Insight:   Engagement in Group:    Modes of Intervention:    Additional Comments:    Gardiner Barefoot 05/26/2023, 2:41 PM

## 2023-05-26 NOTE — [facility] Group Notes (Signed)
 Pt attended and participated in group activity.

## 2023-05-27 ENCOUNTER — Encounter (HOSPITAL_COMMUNITY): Payer: Self-pay

## 2023-05-27 DIAGNOSIS — F1994 Other psychoactive substance use, unspecified with psychoactive substance-induced mood disorder: Secondary | ICD-10-CM | POA: Diagnosis not present

## 2023-05-27 MED ORDER — ADULT MULTIVITAMIN W/MINERALS CH: 1.0000 | ORAL_TABLET | Freq: Every day | ORAL | 0 refills | Status: DC

## 2023-05-27 MED ORDER — VITAMIN D (ERGOCALCIFEROL) 1.25 MG (50000 UNIT) PO CAPS: 50000.0000 [IU] | ORAL_CAPSULE | ORAL | 0 refills | Status: AC

## 2023-05-27 MED ORDER — TRAZODONE HCL 50 MG PO TABS: 50.0000 mg | ORAL_TABLET | Freq: Every evening | ORAL | 0 refills | Status: DC | PRN

## 2023-05-27 MED ORDER — SERTRALINE HCL 50 MG PO TABS: 50.0000 mg | ORAL_TABLET | Freq: Every day | ORAL | 0 refills | Status: DC

## 2023-05-27 MED ORDER — TRAZODONE HCL 50 MG PO TABS: 50.0000 mg | ORAL_TABLET | Freq: Every evening | ORAL | 0 refills | Status: AC | PRN

## 2023-05-27 MED ORDER — VITAMIN D (ERGOCALCIFEROL) 1.25 MG (50000 UNIT) PO CAPS: 50000.0000 [IU] | ORAL_CAPSULE | ORAL | 0 refills | Status: DC

## 2023-05-27 MED ORDER — ADULT MULTIVITAMIN W/MINERALS CH: 1.0000 | ORAL_TABLET | Freq: Every day | ORAL | 0 refills | Status: AC

## 2023-05-27 MED ORDER — SERTRALINE HCL 50 MG PO TABS: 50.0000 mg | ORAL_TABLET | Freq: Every day | ORAL | 0 refills | Status: AC

## 2023-05-27 MED ORDER — FOLIC ACID 1 MG PO TABS
1.0000 mg | ORAL_TABLET | Freq: Every day | ORAL | 0 refills | Status: DC
Start: 2023-05-28 — End: 2023-05-27

## 2023-05-27 MED ORDER — FOLIC ACID 1 MG PO TABS: 1.0000 mg | ORAL_TABLET | Freq: Every day | ORAL | 0 refills | Status: AC

## 2023-05-27 NOTE — Plan of Care (Signed)
   Problem: Education: Goal: Emotional status will improve Outcome: Progressing Goal: Mental status will improve Outcome: Progressing Goal: Verbalization of understanding the information provided will improve Outcome: Progressing

## 2023-05-27 NOTE — Progress Notes (Signed)
   05/27/23 0804  Psych Admission Type (Psych Patients Only)  Admission Status Voluntary  Psychosocial Assessment  Patient Complaints Depression;Anxiety  Eye Contact Fair  Facial Expression Flat  Affect Depressed  Speech Logical/coherent  Interaction Minimal  Motor Activity Other (Comment) (WDL)  Appearance/Hygiene Unremarkable  Behavior Characteristics Cooperative;Appropriate to situation  Mood Depressed;Anxious  Thought Process  Coherency WDL  Content WDL  Delusions None reported or observed  Perception WDL  Hallucination None reported or observed  Judgment Poor  Confusion None  Danger to Self  Current suicidal ideation? Denies  Agreement Not to Harm Self Yes  Description of Agreement Verbal  Danger to Others  Danger to Others None reported or observed

## 2023-05-27 NOTE — Group Note (Signed)
 Occupational Therapy Group Note  Group Topic: Sleep Hygiene  Group Date: 05/27/2023 Start Time: 1430 End Time: 1500 Facilitators: Ted Mcalpine, OT   Group Description: Group encouraged increased participation and engagement through topic focused on sleep hygiene. Patients reflected on the quality of sleep they typically receive and identified areas that need improvement. Group was given background information on sleep and sleep hygiene, including common sleep disorders. Group members also received information on how to improve one's sleep and introduced a sleep diary as a tool that can be utilized to track sleep quality over a length of time. Group session ended with patients identifying one or more strategies they could utilize or implement into their sleep routine in order to improve overall sleep quality.        Therapeutic Goal(s):  Identify one or more strategies to improve overall sleep hygiene  Identify one or more areas of sleep that are negatively impacted (sleep too much, too little, etc)     Participation Level: Engaged   Participation Quality: Independent   Behavior: Appropriate   Speech/Thought Process: Relevant   Affect/Mood: Appropriate   Insight: Fair   Judgement: Fair      Modes of Intervention: Education  Patient Response to Interventions:  Attentive   Plan: Continue to engage patient in OT groups 2 - 3x/week.  05/27/2023  Ted Mcalpine, OT Kerrin Champagne, OT

## 2023-05-27 NOTE — Progress Notes (Signed)
 [facility] MD Progress Note  05/27/2023 3:23 PM Jonathan [name]  MRN:  [id number]  Principal Problem: Substance induced mood disorder (HCC) Diagnosis: Principal Problem:   Substance induced mood disorder (HCC)  HPI: Jonathan [name] is a 28 y.o. AA male with prior mental health diagnoses of MDD, GAD, polysubstance abuse who presented to the [facility] behavioral health urgent care Center on 02/18 requesting detox from cocaine and alcohol. Patient initially denied suicidal ideations, and while provider was discussing outpatient treatment options, he endorsed passive suicidal ideations, with no plan, which led to him being transferred voluntarily to this hospital for treatment and stabilization of his mental status.  This is one of multiple inpatient hospitalizations for this patient who is historically, chronically noncompliant with outpatient services.  Patient assessment note: On assessment today, the pt reports that their mood is euthymic, improved since admission, and stable. Denies feeling down, depressed, or sad.  Reports that anxiety symptoms are at manageable level.  Sleep is stable. Appetite is stable.  Concentration is without complaint.  Energy level is adequate. Denies having any suicidal thoughts. Denies having any suicidal intent and plan.  Denies having any HI.  Denies having psychotic symptoms.   Denies having side effects to current psychiatric medications. Discussed discharge planning for tomorrow, 2/25 to the [facility] residential services program here in [place], [place]. Patient verbalizes understanding and is agreeable to going to this program.  Continuing all medications as listed below with no changes today.  Total Time spent with patient: 45 minutes  Past Psychiatric History: Previous Psych Diagnoses: Substance-induced mood behavior, MDD recurrent severe without psychosis, alcohol use disorder, delta 9 THC dependence, cocaine use disorder, mild alcohol use disorder,  insomnia, polysubstance abuse. Prior inpatient treatment: X 4, in 2017, 2019, 2023, 2024 at [facility]. Current/prior outpatient treatment: Denies Prior rehab hx: Yes at [facility] Psychotherapy hx: Yes History of suicide: Yes x 1 History of homicide or aggression: Denies Psychiatric medication history: Yes, hydroxyzine, nicotine, sertraline, trazodone. Psychiatric medication compliance history: Noncompliance Neuromodulation history: Denies neuro modulation Current Psychiatrist: Denies Current therapist: Denies  Past Medical History:  Past Medical History:  Diagnosis Date   Bipolar 1 disorder (HCC)    Folate deficiency    History reviewed. No pertinent surgical history. Family History: History reviewed. No pertinent family history. Family Psychiatric  History: See H&P Social History:  Social History   Substance and Sexual Activity  Alcohol Use Yes   Alcohol/week: 2.0 standard drinks of alcohol   Types: 2 Cans of beer per week   Comment: Occ     Social History   Substance and Sexual Activity  Drug Use Yes   Types: Marijuana, Cocaine    Social History   Socioeconomic History   Marital status: Single    Spouse name: Not on file   Number of children: Not on file   Years of education: Not on file   Highest education level: Not on file  Occupational History   Not on file  Tobacco Use   Smoking status: Every Day    Current packs/day: 1.00    Types: Cigarettes   Smokeless tobacco: Never  Vaping Use   Vaping status: Never Used  Substance and Sexual Activity   Alcohol use: Yes    Alcohol/week: 2.0 standard drinks of alcohol    Types: 2 Cans of beer per week    Comment: Occ   Drug use: Yes    Types: Marijuana, Cocaine   Sexual activity: Yes    Birth control/protection: Condom  Other Topics Concern   Not on file  Social History Narrative   ** Merged History Encounter **       Social Drivers of Health   Financial Resource Strain: Not on file  Food Insecurity: No  Food Insecurity (05/21/2023)   Hunger Vital Sign    Worried About Running Out of Food in the Last Year: Never true    Ran Out of Food in the Last Year: Never true  Transportation Needs: No Transportation Needs (05/21/2023)   PRAPARE - Administrator, Civil Service (Medical): No    Lack of Transportation (Non-Medical): No  Physical Activity: Not on file  Stress: Not on file (02/07/2023)  Social Connections: Not on file   Additional Social History:    Sleep: Good  Appetite:  Good  Current Medications: Current Facility-Administered Medications  Medication Dose Route Frequency Provider Last Rate Last Admin   acetaminophen (TYLENOL) tablet 650 mg  650 mg Oral Q6H PRN Ardis Hughs, NP   650 mg at 05/23/23 2115   alum & mag hydroxide-simeth (MAALOX/MYLANTA) 200-200-20 MG/5ML suspension 30 mL  30 mL Oral Q4H PRN Ardis Hughs, NP       haloperidol (HALDOL) tablet 5 mg  5 mg Oral TID PRN Ardis Hughs, NP       And   diphenhydrAMINE (BENADRYL) capsule 50 mg  50 mg Oral TID PRN Ardis Hughs, NP       haloperidol (HALDOL) tablet 5 mg  5 mg Oral TID PRN Ardis Hughs, NP       And   diphenhydrAMINE (BENADRYL) capsule 50 mg  50 mg Oral TID PRN Ardis Hughs, NP       haloperidol lactate (HALDOL) injection 10 mg  10 mg Intramuscular TID PRN Ardis Hughs, NP       And   diphenhydrAMINE (BENADRYL) injection 50 mg  50 mg Intramuscular TID PRN Ardis Hughs, NP       And   LORazepam (ATIVAN) injection 2 mg  2 mg Intramuscular TID PRN Ardis Hughs, NP       haloperidol lactate (HALDOL) injection 5 mg  5 mg Intramuscular TID PRN Ardis Hughs, NP       And   diphenhydrAMINE (BENADRYL) injection 50 mg  50 mg Intramuscular TID PRN Ardis Hughs, NP       And   LORazepam (ATIVAN) injection 2 mg  2 mg Intramuscular TID PRN Ardis Hughs, NP       folic acid (FOLVITE) tablet 1 mg  1 mg Oral Daily Golda Acre, MD   1 mg at  05/27/23 0804   magnesium hydroxide (MILK OF MAGNESIA) suspension 30 mL  30 mL Oral Daily PRN Ardis Hughs, NP       multivitamin with minerals tablet 1 tablet  1 tablet Oral Daily Ardis Hughs, NP   1 tablet at 05/27/23 0804   sertraline (ZOLOFT) tablet 50 mg  50 mg Oral QHS Bennett, Christal H, NP   50 mg at 05/26/23 2116   thiamine (Vitamin B-1) tablet 100 mg  100 mg Oral Daily Ardis Hughs, NP   100 mg at 05/27/23 0804   traZODone (DESYREL) tablet 50 mg  50 mg Oral QHS PRN Ajibola, Ene A, NP   50 mg at 05/26/23 2116   Vitamin D (Ergocalciferol) (DRISDOL) 1.25 MG (50000 UNIT) capsule 50,000 Units  50,000 Units Oral Q7 days Golda Acre,  MD   50,000 Units at 05/22/23 1914    Lab Results:  No results found for this or any previous visit (from the past 48 hours).   Blood Alcohol level:  Lab Results  Component Value Date   ETH <10 05/21/2023   ETH <10 12/29/2022    Metabolic Disorder Labs: Lab Results  Component Value Date   HGBA1C 5.0 05/21/2023   MPG 96.8 05/21/2023   MPG 111.15 11/20/2022   No results found for: "PROLACTIN" Lab Results  Component Value Date   CHOL 192 05/21/2023   TRIG 174 (H) 05/21/2023   HDL 35 (L) 05/21/2023   CHOLHDL 5.5 05/21/2023   VLDL 35 05/21/2023   LDLCALC 122 (H) 05/21/2023   LDLCALC 125 (H) 12/29/2022    Physical Findings: AIMS:  , ,  ,  ,    CIWA:  CIWA-Ar Total: 0 COWS:     Musculoskeletal: Strength & Muscle Tone: within normal limits Gait & Station: normal Patient leans: N/A  Psychiatric Specialty Exam:  Presentation  General Appearance:  Appropriate for Environment  Eye Contact: Good  Speech: Clear and Coherent  Speech Volume: Normal  Handedness: Right  Mood and Affect  Mood: Euthymic  Affect: Appropriate  Thought Process  Thought Processes: Coherent  Descriptions of Associations:Intact  Orientation:Full (Time, Place and Person)  Thought Content:WDL  History of  Schizophrenia/Schizoaffective disorder:No  Duration of Psychotic Symptoms:No data recorded Hallucinations:Hallucinations: None   Ideas of Reference:None  Suicidal Thoughts:Suicidal Thoughts: No SI Passive Intent and/or Plan: -- (Denies)   Homicidal Thoughts:Homicidal Thoughts: No   Sensorium  Memory: Immediate Good  Judgment: Fair  Insight: Fair  Art therapist  Concentration: Fair  Attention Span: Fair  Recall: [employer] of Knowledge: Fair  Language: Fair  Psychomotor Activity  Psychomotor Activity: Psychomotor Activity: Normal   Assets  Assets: Resilience  Sleep  Sleep: Sleep: Good   Physical Exam: Physical Exam Vitals and nursing note reviewed.  Constitutional:      Appearance: Normal appearance.  HENT:     Head: Normocephalic.  Eyes:     Extraocular Movements: Extraocular movements intact.  Cardiovascular:     Rate and Rhythm: Normal rate.     Pulses: Normal pulses.  Pulmonary:     Effort: Pulmonary effort is normal.  Abdominal:     Comments: Deferred  Genitourinary:    Comments: Deferred Musculoskeletal:        General: Normal range of motion.     Cervical back: Normal range of motion.  Neurological:     General: No focal deficit present.     Mental Status: He is alert and oriented to person, place, and time.    Review of Systems  Constitutional: Negative.   HENT:  Negative for congestion and sore throat.   Respiratory:  Negative for cough and shortness of breath.   Cardiovascular:  Negative for chest pain and palpitations.  Gastrointestinal:  Negative for abdominal pain, constipation, diarrhea, nausea and vomiting.  Neurological:  Negative for dizziness, tremors, focal weakness, loss of consciousness and headaches.  Psychiatric/Behavioral:  Positive for depression and substance abuse. Negative for hallucinations and suicidal ideas. The patient is nervous/anxious. The patient does not have insomnia.    Blood pressure  135/88, pulse 72, temperature 98.5 F (36.9 C), temperature source Oral, resp. rate 20, height 5\' 8"  (1.727 m), weight 82.8 kg, SpO2 100%. Body mass index is 27.76 kg/m.  Treatment Plan Summary: Daily contact with patient to assess and evaluate symptoms and progress in treatment  and Medication management Plans: Medications: --Continue Zoloft 50 mg, oral, daily at bedtime for depression/anxiety --Continue trazodone tablet 50 mg p.o. q. nightly for insomnia --Continue hydroxyzine tablets 25 mg p.o. 3 times daily as needed for anxiety.   Medication for other medical problems: -- Vitamin D 1.25 mg 50,000 units Q 7 days vitamin D deficiency   Ativan detox protocol: See MAR   Agitation protocol: Benadryl capsule 50 mg p.o. or IM 3 times daily as needed agitation   Haldol tablets 5 mg po IM 3 times daily as needed agitation   Lorazepam tablet 2 mg p.o. or IM 3 times daily as needed agitation     Other PRN Medications -Acetaminophen 650 mg every 6 as needed/mild pain -Maalox 30 mL oral every 4 as needed/digestion -Magnesium hydroxide 30 mL daily as needed/mild constipation   --The risks/benefits/side-effects/alternatives to this medication were discussed in detail with the patient and time was given for questions. The patient consents to medication trial.  -- Metabolic profile and EKG monitoring obtained while on an atypical antipsychotic (BMI: Lipid Panel: HbgA1c: QTc:)  -- Encouraged patient to participate in unit milieu and in scheduled group therapies    Admission labs reviewed: CMP: CO2 21 low, glucose 65 low, alkaline phosphatase 32 low, otherwise normal.  Lipid profile: HDL 35 low, LDL 122 high, triglyceride 174 hide, otherwise normal.  CBC with differential: WBC 13.3 high, hemoglobin 17.8 high, neutrophils 10.0 high, otherwise normal.  Vitamin B12: 283 within normal limits.  TSH: 0.499 within normal limits.  Hemoglobin A1c: 5.0 within normal limits.  UA: Within normal limits.  UDS:  Positive for cocaine and marijuana.   Labs Reviewed: Folate 6.2  EKG reviewed: Normal sinus rhythm, ventricular rate 62, QT/QTc: 394/399.   Safety and Monitoring: Voluntary admission to inpatient psychiatric unit for safety, stabilization and treatment Daily contact with patient to assess and evaluate symptoms and progress in treatment Patient's case to be discussed in multi-disciplinary team meeting Observation Level : q15 minute checks Vital signs: q12 hours Precautions: suicide, but pt currently verbally contracts for safety on unit    Discharge Planning: Social work and case management to assist with discharge planning and identification of hospital follow-up needs prior to discharge Estimated LOS: 5-7 days Discharge Concerns: Need to establish a safety plan; Medication compliance and effectiveness Discharge Goals: Return home with outpatient referrals for mental health follow-up including medication management/psychotherapy.   Long Term Goal(s): Improvement in symptoms so as ready for discharge   Short Term Goals: Ability to identify changes in lifestyle to reduce recurrence of condition will improve, Ability to verbalize feelings will improve, Ability to disclose and discuss suicidal ideas, Ability to demonstrate self-control will improve, Ability to identify and develop effective coping behaviors will improve, Ability to maintain clinical measurements within normal limits will improve, Compliance with prescribed medications will improve, and Ability to identify triggers associated with substance abuse/mental health issues will improve   Physician Treatment Plan for Secondary Diagnosis: Principal Problem:   Substance induced mood disorder (HCC) Active Problems:   Alcohol use disorder   Delta-9-tetrahydrocannabinol (THC) dependence (HCC)   Cocaine use disorder (HCC)   Mild alcohol use disorder   Insomnia   Starleen Blue, NP 05/27/2023, 3:23 PM Patient ID: [name], male    DOB: 07/03/1995, 28 y.o.   MRN: [id number] Patient ID: Morgon Pamer, male   DOB: 02-15-1996, 28 y.o.   MRN: [id number] Patient ID: Weldon Nouri, male   DOB: January 26, 1996, 28 y.o.   MRN: [id number]  Patient ID: Muriel Wilber, male   DOB: 31-Mar-1996, 28 y.o.   MRN: [id number]

## 2023-05-27 NOTE — BH IP Treatment Plan (Signed)
 Interdisciplinary Treatment and Diagnostic Plan Update  05/27/2023 Time of Session: 11:40AM Jonathan [name] MRN: [id number]  Principal Diagnosis: Substance induced mood disorder (HCC)  Secondary Diagnoses: Principal Problem:   Substance induced mood disorder (HCC)   Current Medications:  Current Facility-Administered Medications  Medication Dose Route Frequency Provider Last Rate Last Admin   acetaminophen (TYLENOL) tablet 650 mg  650 mg Oral Q6H PRN Ardis Hughs, NP   650 mg at 05/23/23 2115   alum & mag hydroxide-simeth (MAALOX/MYLANTA) 200-200-20 MG/5ML suspension 30 mL  30 mL Oral Q4H PRN Ardis Hughs, NP       haloperidol (HALDOL) tablet 5 mg  5 mg Oral TID PRN Ardis Hughs, NP       And   diphenhydrAMINE (BENADRYL) capsule 50 mg  50 mg Oral TID PRN Ardis Hughs, NP       haloperidol (HALDOL) tablet 5 mg  5 mg Oral TID PRN Ardis Hughs, NP       And   diphenhydrAMINE (BENADRYL) capsule 50 mg  50 mg Oral TID PRN Ardis Hughs, NP       haloperidol lactate (HALDOL) injection 10 mg  10 mg Intramuscular TID PRN Ardis Hughs, NP       And   diphenhydrAMINE (BENADRYL) injection 50 mg  50 mg Intramuscular TID PRN Ardis Hughs, NP       And   LORazepam (ATIVAN) injection 2 mg  2 mg Intramuscular TID PRN Ardis Hughs, NP       haloperidol lactate (HALDOL) injection 5 mg  5 mg Intramuscular TID PRN Ardis Hughs, NP       And   diphenhydrAMINE (BENADRYL) injection 50 mg  50 mg Intramuscular TID PRN Ardis Hughs, NP       And   LORazepam (ATIVAN) injection 2 mg  2 mg Intramuscular TID PRN Ardis Hughs, NP       folic acid (FOLVITE) tablet 1 mg  1 mg Oral Daily Golda Acre, MD   1 mg at 05/27/23 0804   magnesium hydroxide (MILK OF MAGNESIA) suspension 30 mL  30 mL Oral Daily PRN Ardis Hughs, NP       multivitamin with minerals tablet 1 tablet  1 tablet Oral Daily Ardis Hughs, NP   1 tablet at 05/27/23  0804   sertraline (ZOLOFT) tablet 50 mg  50 mg Oral QHS Bennett, Christal H, NP   50 mg at 05/26/23 2116   thiamine (Vitamin B-1) tablet 100 mg  100 mg Oral Daily Ardis Hughs, NP   100 mg at 05/27/23 0804   traZODone (DESYREL) tablet 50 mg  50 mg Oral QHS PRN Ajibola, Ene A, NP   50 mg at 05/26/23 2116   Vitamin D (Ergocalciferol) (DRISDOL) 1.25 MG (50000 UNIT) capsule 50,000 Units  50,000 Units Oral Q7 days Golda Acre, MD   50,000 Units at 05/22/23 4098   PTA Medications: No medications prior to admission.    Patient Stressors: Educational concerns    Patient Strengths: Ability for insight   Treatment Modalities: Medication Management, Group therapy, Case management,  1 to 1 session with clinician, Psychoeducation, Recreational therapy.   Physician Treatment Plan for Primary Diagnosis: Substance induced mood disorder (HCC) Long Term Goal(s): Improvement in symptoms so as ready for discharge   Short Term Goals: Ability to identify changes in lifestyle to reduce recurrence of condition will improve Ability to verbalize feelings will improve  Ability to disclose and discuss suicidal ideas Ability to demonstrate self-control will improve Ability to identify and develop effective coping behaviors will improve Ability to maintain clinical measurements within normal limits will improve Compliance with prescribed medications will improve Ability to identify triggers associated with substance abuse/mental health issues will improve  Medication Management: Evaluate patient's response, side effects, and tolerance of medication regimen.  Therapeutic Interventions: 1 to 1 sessions, Unit Group sessions and Medication administration.  Evaluation of Outcomes: Progressing  Physician Treatment Plan for Secondary Diagnosis: Principal Problem:   Substance induced mood disorder (HCC)  Long Term Goal(s): Improvement in symptoms so as ready for discharge   Short Term Goals: Ability to  identify changes in lifestyle to reduce recurrence of condition will improve Ability to verbalize feelings will improve Ability to disclose and discuss suicidal ideas Ability to demonstrate self-control will improve Ability to identify and develop effective coping behaviors will improve Ability to maintain clinical measurements within normal limits will improve Compliance with prescribed medications will improve Ability to identify triggers associated with substance abuse/mental health issues will improve     Medication Management: Evaluate patient's response, side effects, and tolerance of medication regimen.  Therapeutic Interventions: 1 to 1 sessions, Unit Group sessions and Medication administration.  Evaluation of Outcomes: Progressing   RN Treatment Plan for Primary Diagnosis: Substance induced mood disorder (HCC) Long Term Goal(s): Knowledge of disease and therapeutic regimen to maintain health will improve  Short Term Goals: Ability to verbalize frustration and anger appropriately will improve, Ability to demonstrate self-control, Ability to disclose and discuss suicidal ideas, Ability to identify and develop effective coping behaviors will improve, and Compliance with prescribed medications will improve  Medication Management: RN will administer medications as ordered by provider, will assess and evaluate patient's response and provide education to patient for prescribed medication. RN will report any adverse and/or side effects to prescribing provider.  Therapeutic Interventions: 1 on 1 counseling sessions, Psychoeducation, Medication administration, Evaluate responses to treatment, Monitor vital signs and CBGs as ordered, Perform/monitor CIWA, COWS, AIMS and Fall Risk screenings as ordered, Perform wound care treatments as ordered.  Evaluation of Outcomes: Progressing   LCSW Treatment Plan for Primary Diagnosis: Substance induced mood disorder (HCC) Long Term Goal(s): Safe  transition to appropriate next level of care at discharge, Engage patient in therapeutic group addressing interpersonal concerns.  Short Term Goals: Engage patient in aftercare planning with referrals and resources, Increase social support, Increase ability to appropriately verbalize feelings, Increase emotional regulation, Facilitate acceptance of mental health diagnosis and concerns, and Facilitate patient progression through stages of change regarding substance use diagnoses and concerns  Therapeutic Interventions: Assess for all discharge needs, 1 to 1 time with Social worker, Explore available resources and support systems, Assess for adequacy in community support network, Educate family and significant other(s) on suicide prevention, Complete Psychosocial Assessment, Interpersonal group therapy.  Evaluation of Outcomes: Progressing   Progress in Treatment: Attending groups: No. Participating in groups: No. Taking medication as prescribed: Yes. Toleration medication: Yes. Family/Significant other contact made: No, will contact:  consents pending Patient understands diagnosis: Yes. Discussing patient identified problems/goals with staff: Yes. Medical problems stabilized or resolved: Yes. Denies suicidal/homicidal ideation: Yes.   New problem(s) identified: No, Describe:  none   New Short Term/Long Term Goal(s): detox, medication management for mood stabilization; elimination of SI thoughts; development of comprehensive mental wellness/sobriety plan     Patient Goals:  "Get into [facility] or another substance use treatment place"   Discharge Plan or  Barriers: Patient recently admitted. CSW will continue to follow and assess for appropriate referrals and possible discharge planning.      Reason for Continuation of Hospitalization: Depression Medication stabilization Suicidal ideation Withdrawal symptoms   Estimated Length of Stay: 5-7 days  Last 3 [place] Suicide Severity Risk  Score: Flowsheet Row [facility]Current[facility] Most recent reading at 05/21/2023 10:46 PM ED from 05/21/2023 in [facility] Most recent reading at 05/21/2023  1:53 PM ED from 02/25/2023 in [facility] at [facility] Most recent reading at 02/25/2023 11:23 AM  C-SSRS RISK CATEGORY Low Risk No Risk No Risk       Last PHQ 2/9 Scores:    01/03/2023   10:29 AM 01/01/2023    1:23 PM 12/30/2022   12:40 AM  Depression screen PHQ 2/9  Decreased Interest 0 1 1  Down, Depressed, Hopeless 0 1 1  PHQ - 2 Score 0 2 2  Altered sleeping 0 3 2  Tired, decreased energy 0 3 2  Change in appetite 0 0 2  Feeling bad or failure about yourself  0 3 2  Trouble concentrating 0 3 2  Moving slowly or fidgety/restless 0 1 1  Suicidal thoughts 0 1 2  PHQ-9 Score 0 16 15  Difficult doing work/chores Not difficult at all Somewhat difficult Very difficult    Scribe for Treatment Team: Jacinta Shoe, LCSW 05/27/2023 4:08 PM

## 2023-05-27 NOTE — BHH Suicide Risk Assessment (Signed)
 Suicide Risk Assessment  Discharge Assessment    [facility] Discharge Suicide Risk Assessment   Principal Problem: Substance induced mood disorder (HCC) Discharge Diagnoses: Principal Problem:   Substance induced mood disorder (HCC)  HPI: Jonathan [name] is a 28 y.o. AA male with prior mental health diagnoses of MDD, GAD, polysubstance abuse who presented to the [facility] behavioral health urgent care Center on 02/18 requesting detox from cocaine and alcohol. Patient initially denied suicidal ideations, and while provider was discussing outpatient treatment options, he endorsed passive suicidal ideations, with no plan, which led to him being transferred voluntarily to this hospital for treatment and stabilization of his mental status.  This is one of multiple inpatient hospitalizations for this patient who is historically, chronically noncompliant with outpatient services.   Hospital Course: During the patient's hospitalization, patient had extensive initial psychiatric evaluation, and follow-up psychiatric evaluations every day. Psychiatric diagnoses provided upon initial assessment: Substance induced mood d/o.  Patient's psychiatric medications were adjusted on admission: Zoloft was started for depressive symptoms.  Trazodone 50 mg started as needed for sleep. During the hospitalization, no other adjustments were made to the patient's psychiatric medication regimen. Patient will be discharged on medications as listed above.   Patient's care was discussed during the interdisciplinary team meeting every day during the hospitalization. The patient denies having side effects to prescribed psychiatric medication. The patient was evaluated each day by a clinical provider to ascertain response to treatment. Improvement was noted by the patient's report of decreasing symptoms, improved sleep and appetite, affect, medication tolerance, behavior, and participation in unit programming.  Patient was asked each  day to complete a self inventory noting mood, mental status, pain, new symptoms, anxiety and concerns. Symptoms were reported as significantly decreased or resolved completely by discharge.   On day of discharge, the patient reports that their mood is stable. The patient denies SI/HI/AVH, denies delusional thinking, denies paranoia and there are no overt signs of psychosis. The patient verbalizes motivation to continue taking medication with a goal of continued improvement in mental health.   Labs were reviewed with the patient, and abnormal results were discussed with the patient.  The patient is able to verbalize their individual safety plan to this provider.  # It is recommended to the patient to continue psychiatric medications as prescribed, after discharge from the hospital.    # It is recommended to the patient to follow up with your outpatient psychiatric provider and PCP.  # It was discussed with the patient, the impact of alcohol, drugs, tobacco have been there overall psychiatric and medical wellbeing, and total abstinence from substance use was recommended the patient.ed.  # Prescriptions provided or sent directly to preferred pharmacy at discharge. Patient agreeable to plan. Given opportunity to ask questions. Appears to feel comfortable with discharge.    # In the event of worsening symptoms, the patient is instructed to call the crisis hotline (988), 911 and or go to the nearest ED, or go to [facility] for appropriate evaluation and treatment of symptoms. Educated to follow-up with primary care provider for other medical issues, concerns and or health care needs, and verbalizes understanding.  # Patient was discharged to the [facility] facility with a plan to follow up as noted below.   Total Time spent with patient: 45 minutes  Musculoskeletal: Strength & Muscle Tone: within normal limits Gait & Station: normal Patient leans: N/A  Psychiatric Specialty  Exam  Presentation  General Appearance:  Appropriate for Environment  Eye  Contact: Good  Speech: Clear and Coherent  Speech Volume: Normal  Handedness: Right   Mood and Affect  Mood: Euthymic  Duration of Depression Symptoms: Greater than two weeks  Affect: Appropriate   Thought Process  Thought Processes: Coherent  Descriptions of Associations:Intact  Orientation:Full (Time, Place and Person)  Thought Content:WDL  History of Schizophrenia/Schizoaffective disorder:No  Duration of Psychotic Symptoms:No data recorded Hallucinations:Hallucinations: None  Ideas of Reference:None  Suicidal Thoughts:Suicidal Thoughts: No SI Passive Intent and/or Plan: -- (Denies)  Homicidal Thoughts:Homicidal Thoughts: No   Sensorium  Memory: Immediate Good  Judgment: Fair  Insight: Fair   Art therapist  Concentration: Fair  Attention Span: Fair  Recall: [employer] of Knowledge: Fair  Language: Fair   Psychomotor Activity  Psychomotor Activity: Psychomotor Activity: Normal   Assets  Assets: Resilience   Sleep  Sleep: Sleep: Good   Physical Exam: Physical Exam Vitals and nursing note reviewed.  Neurological:     Mental Status: He is oriented to person, place, and time.  Psychiatric:        Mood and Affect: Mood normal.        Behavior: Behavior normal.        Thought Content: Thought content normal.        Judgment: Judgment normal.    Review of Systems  Psychiatric/Behavioral:  Positive for depression (Denies S/HI, denies plan or intent to harm self or others) and substance abuse. Negative for hallucinations, memory loss and suicidal ideas. The patient is nervous/anxious (stable for management outpatient) and has insomnia (stable for management outpatient).   All other systems reviewed and are negative.  Blood pressure 135/88, pulse 72, temperature 98.5 F (36.9 C), temperature source Oral, resp. rate 20, height 5\' 8"   (1.727 m), weight 82.8 kg, SpO2 100%. Body mass index is 27.76 kg/m.  Mental Status Per Nursing Assessment::   On Admission:  Suicidal ideation indicated by patient Denies SI/HI, denies intent or plan to harm self or others at time of discharge. Demographic Factors:  Male and Low socioeconomic status  Loss Factors: Decrease in vocational status and Financial problems/change in socioeconomic status  Historical Factors: Family history of mental illness or substance abuse  Risk Reduction Factors:   Positive social support  Continued Clinical Symptoms:  Alcohol/Substance Abuse/Dependencies Unstable or Poor Therapeutic Relationship Previous Psychiatric Diagnoses and Treatments  Cognitive Features That Contribute To Risk:  None    Suicide Risk:  Minimal: No identifiable suicidal ideation.  Patients presenting with no risk factors but with morbid ruminations; may be classified as minimal risk based on the severity of the depressive symptoms    Follow-up Information     Services, [facility]. Go on 05/28/2023.   Why: You have been accepted to this treatment facility for substance use. You will begin treatment on 05/28/23 at 9:00AM. You will receive therapy and medication management at this program. Contact information: [address] [place] [address] [phone]         [facility]. Go to.   Specialty: Behavioral Health Why: Please go to this provider for medication management services, Monday through Friday, arrive by 7:00 am after completing [facility]. Contact information: [address] [place] [address] [phone]        [facility]. Go to.   Specialty: Professional Counselor Why: Please go to this provider for therapy services, Monday through Friday, from 9 am to 1 pm after completing [facility]. Contact information: [address] [place]  [address] [phone]                Starleen Blue, NP 05/27/2023, 3:37 PM

## 2023-05-27 NOTE — BHH Group Notes (Signed)
 Spiritual care group on grief and loss facilitated by Chaplain Dyanne Carrel, B[facility]  Group Goal: Support / Education around grief and loss  Members engage in facilitated group support and psycho-social education.  Group Description:  Following introductions and group rules, group members engaged in facilitated group dialogue and support around topic of loss, with particular support around experiences of loss in their lives. Group Identified types of loss (relationships / self / things) and identified patterns, circumstances, and changes that precipitate losses. Reflected on thoughts / feelings around loss, normalized grief responses, and recognized variety in grief experience. Group encouraged individual reflection on safe space and on the coping skills that they are already utilizing.  Group drew on Adlerian / Rogerian and narrative framework  Patient Progress: Jonathan [name] attended group.  Verbal participation was minimal, but he demonstrated engagement in the conversation.

## 2023-05-27 NOTE — Transportation (Signed)
 05/27/2023  Jonathan [name] DOB: February 28, 1996 MRN: [id number]   RIDER WAIVER AND RELEASE OF LIABILITY  For the purposes of helping with transportation needs, [facility] partners with outside transportation providers (taxi companies, [place], Catering manager.) to give [employer] patients or other approved people the choice of on-demand rides [facility]") to our buildings for non-emergency visits.  By using [employer], I, the person signing this document, on behalf of myself and/or any legal minors (in my care using the [employer]), agree:  Science writer given to me are supplied by independent, outside transportation providers who do not work for, or have any affiliation with, [employer]. [place] is not a transportation company. [place] has no control over the quality or safety of the rides I get using [employer]. [facility] has no control over whether any outside ride will happen on time or not. [facility] gives no guarantee on the reliability, quality, safety, or availability on any rides, or that no mistakes will happen. I know and accept that traveling by vehicle (car, truck, SVU, Zenaida Niece, bus, taxi, etc.) has risks of serious injuries such as disability, being paralyzed, and death. I know and agree the risk of using [employer] is mine alone, and not [employer]. Transport Services are provided "as is" and as are available. The transportation providers are in charge for all inspections and care of the vehicles used to provide these rides. I agree not to take legal action against [place], its agents, employees, officers, directors, representatives, insurers, attorneys, assigns, successors, subsidiaries, and affiliates at any time for any reasons related directly or indirectly to using [employer]. I also agree not to take legal action against [facility] or its affiliates for any injury, death, or damage to property caused by or related to using  [employer]. I have read this Waiver and Release of Liability, and I understand the terms used in it and their legal meaning. This Waiver is freely and voluntarily given with the understanding that my right (or any legal minors) to legal action against [place] relating to [employer] is knowingly given up to use these services.   I attest that I read the Ride Waiver and Release of Liability to Jonathan [name], gave Mr. Winemiller the opportunity to ask questions and answered the questions asked (if any). I affirm that Jonathan [name] then provided consent for assistance with transportation.

## 2023-05-27 NOTE — Group Note (Signed)
 Recreation Therapy Group Note   Group Topic:Health and Wellness  Group Date: 05/27/2023 Start Time: 0931 End Time: 0950 Facilitators: Sharanda Shinault-McCall, LRT,CTRS Location: [address] Dayroom   Group Topic: Wellness  Goal Area(s) Addresses:  Patient will define components of whole wellness. Patient will verbalize benefit of whole wellness.  Intervention: Music  Activity: Exercise. LRT instructed patients they would take turns leading the group in the exercises of their choosing. Patients were encouraged to give their best but if they needed to take a break or get water to do so. Patients were also encouraged to give their best effort throughout group session.   Education: Wellness, Building control surveyor.   Education Outcome: Acknowledges education/In group clarification offered/Needs additional education.   Affect/Mood: N/A   Participation Level: Did not attend    Clinical Observations/Individualized Feedback:      Plan: Continue to engage patient in RT group sessions 2-3x/week.   Juanna Pudlo-McCall, LRT,CTRS 05/27/2023 12:38 PM

## 2023-05-27 NOTE — Progress Notes (Signed)
  [facility] Adult Case Management Discharge Plan :  Will you be returning to the same living situation after discharge:  No. Pt will admit to [facility] tomorrow morning At discharge, do you have transportation home?: Yes,  CSW arranged [employer] for 800AM Do you have the ability to pay for your medications: Yes,  pt has active [employer]  Release of information consent forms completed and in the chart;  Patient's signature needed at discharge.  Patient to Follow up at:  Follow-up Information     Services, [facility]. Go on 05/28/2023.   Why: You have been accepted to this treatment facility for substance use. You will begin treatment on 05/28/23 at 9:00AM. You will receive therapy and medication management at this program. Contact information: [address] [place] [address] [phone]         [facility]. Go to.   Specialty: Behavioral Health Why: Please go to this provider for medication management services, Monday through Friday, arrive by 7:00 am after completing [facility]. Contact information: [address] [place] [address] [phone]        [facility]. Go to.   Specialty: Professional Counselor Why: Please go to this provider for therapy services, Monday through Friday, from 9 am to 1 pm after completing [facility]. Contact information: [address] [place] [address] [phone]                 Next level of care provider has access to [facility] Link:no  Safety Planning and Suicide Prevention discussed: No. Pt declined consents, reviewed information with pt.      Has patient been referred to the Quitline?: Patient refused referral for treatment  Patient has been referred for addiction treatment: Pt will admit to [facility] for substance use treatment  Kathi Der, LCSWA 05/27/2023, 3:06 PM

## 2023-05-27 NOTE — BHH Group Notes (Signed)
 BHH Group Notes:  (Nursing/MHT/Case Management/Adjunct)  Date:  05/27/2023  Time:  10:12 PM  Type of Therapy:  Psychoeducational Skills  Participation Level:  Active  Participation Quality:  Attentive  Affect:  Appropriate  Cognitive:  Appropriate  Insight:  Appropriate  Engagement in Group:  Developing/Improving  Modes of Intervention:  Education  Summary of Progress/Problems: Patient rated his day as a 10 out of 10. He states that he had a good visit with his girlfriend and that he anticipates being discharged tomorrow.   Hazle Coca S 05/27/2023, 10:12 PM

## 2023-05-28 NOTE — Progress Notes (Signed)
   05/28/23 0800  Psych Admission Type (Psych Patients Only)  Admission Status Voluntary  Psychosocial Assessment  Patient Complaints None  Eye Contact Fair  Facial Expression Anxious  Affect Flat  Speech Logical/coherent  Interaction Minimal  Motor Activity Other (Comment) (WDL)  Appearance/Hygiene Unremarkable  Behavior Characteristics Cooperative;Appropriate to situation  Mood Anxious  Thought Process  Coherency WDL  Content WDL  Delusions None reported or observed  Perception WDL  Hallucination None reported or observed  Judgment Poor  Confusion None  Danger to Self  Current suicidal ideation? Denies  Agreement Not to Harm Self Yes  Description of Agreement verbal  Danger to Others  Danger to Others None reported or observed

## 2023-05-28 NOTE — Progress Notes (Signed)
 Patient ID: Jonathan [name], male   DOB: [birth year], 28 y.o.   MRN: [id number] Medications, follow up appointments reviewed with patient, patient verbalized understanding. Patient belongings returned, pt dc via taxi.

## 2023-05-28 NOTE — Plan of Care (Signed)
  Problem: Education: Goal: Knowledge of [facility] Education information/materials will improve Outcome: Adequate for Discharge Goal: Emotional status will improve Outcome: Adequate for Discharge Goal: Mental status will improve Outcome: Adequate for Discharge Goal: Verbalization of understanding the information provided will improve Outcome: Adequate for Discharge   Problem: Activity: Goal: Interest or engagement in activities will improve Outcome: Adequate for Discharge Goal: Sleeping patterns will improve Outcome: Adequate for Discharge   Problem: Coping: Goal: Ability to verbalize frustrations and anger appropriately will improve Outcome: Adequate for Discharge Goal: Ability to demonstrate self-control will improve Outcome: Adequate for Discharge   

## 2023-06-17 NOTE — Discharge Summary (Addendum)
 [facility] Discharge Summary     Principal Problem: Substance induced mood disorder (HCC) Discharge Diagnoses: Principal Problem:   Substance induced mood disorder (HCC)   HPI: Jonathan [name] is a 28 y.o. AA male with prior mental health diagnoses of MDD, GAD, polysubstance abuse who presented to the [facility] behavioral health urgent care Center on 02/18 requesting detox from cocaine and alcohol. Patient initially denied suicidal ideations, and while provider was discussing outpatient treatment options, he endorsed passive suicidal ideations, with no plan, which led to him being transferred voluntarily to this hospital for treatment and stabilization of his mental status.  This is one of multiple inpatient hospitalizations for this patient who is historically, chronically noncompliant with outpatient services.   Admitted from 05/21/23-05/27/23  Hospital Course: During the patient's hospitalization, patient had extensive initial psychiatric evaluation, and follow-up psychiatric evaluations every day. Psychiatric diagnoses provided upon initial assessment: Substance induced mood d/o.   Patient's psychiatric medications were adjusted on admission: Zoloft was started for depressive symptoms.  Trazodone 50 mg started as needed for sleep. During the hospitalization, no other adjustments were made to the patient's psychiatric medication regimen. Patient will be discharged on medications as listed above.    Patient's care was discussed during the interdisciplinary team meeting every day during the hospitalization. The patient denies having side effects to prescribed psychiatric medication. The patient was evaluated each day by a clinical provider to ascertain response to treatment. Improvement was noted by the patient's report of decreasing symptoms, improved sleep and appetite, affect, medication tolerance, behavior, and participation in unit programming.  Patient was asked each day to complete a self  inventory noting mood, mental status, pain, new symptoms, anxiety and concerns. Symptoms were reported as significantly decreased or resolved completely by discharge.    On day of discharge, the patient reports that their mood is stable. The patient denies SI/HI/AVH, denies delusional thinking, denies paranoia and there are no overt signs of psychosis. The patient verbalizes motivation to continue taking medication with a goal of continued improvement in mental health.    Labs were reviewed with the patient, and abnormal results were discussed with the patient.   The patient is able to verbalize their individual safety plan to this provider.   # It is recommended to the patient to continue psychiatric medications as prescribed, after discharge from the hospital.     # It is recommended to the patient to follow up with your outpatient psychiatric provider and PCP.   # It was discussed with the patient, the impact of alcohol, drugs, tobacco have been there overall psychiatric and medical wellbeing, and total abstinence from substance use was recommended the patient.ed.   # Prescriptions provided or sent directly to preferred pharmacy at discharge. Patient agreeable to plan. Given opportunity to ask questions. Appears to feel comfortable with discharge.    # In the event of worsening symptoms, the patient is instructed to call the crisis hotline (988), 911 and or go to the nearest ED, or go to [facility] for appropriate evaluation and treatment of symptoms. Educated to follow-up with primary care provider for other medical issues, concerns and or health care needs, and verbalizes understanding.   # Patient was discharged to the [facility] facility with a plan to follow up as noted below.    Total Time spent with patient: 45 minutes   Musculoskeletal: Strength & Muscle Tone: within normal limits Gait & Station: normal Patient leans: N/A   Psychiatric Specialty Exam  Presentation  General Appearance:  Appropriate for Environment   Eye Contact: Good   Speech: Clear and Coherent   Speech Volume: Normal   Handedness: Right     Mood and Affect  Mood: Euthymic   Duration of Depression Symptoms: Greater than two weeks   Affect: Appropriate     Thought Process  Thought Processes: Coherent   Descriptions of Associations:Intact   Orientation:Full (Time, Place and Person)   Thought Content:WDL   History of Schizophrenia/Schizoaffective disorder:No   Duration of Psychotic Symptoms:No data recorded Hallucinations:Hallucinations: None   Ideas of Reference:None   Suicidal Thoughts:Suicidal Thoughts: No SI Passive Intent and/or Plan: -- (Denies)   Homicidal Thoughts:Homicidal Thoughts: No     Sensorium  Memory: Immediate Good   Judgment: Fair   Insight: Fair     Art therapist  Concentration: Fair   Attention Span: Fair   Recall: Fair   Fund of Knowledge: Fair   Language: Fair     Psychomotor Activity  Psychomotor Activity: Psychomotor Activity: Normal     Assets  Assets: Resilience     Sleep  Sleep: Sleep: Good     Physical Exam: Physical Exam Vitals and nursing note reviewed.  Neurological:     Mental Status: He is oriented to person, place, and time.  Psychiatric:        Mood and Affect: Mood normal.        Behavior: Behavior normal.        Thought Content: Thought content normal.        Judgment: Judgment normal.      Review of Systems  Psychiatric/Behavioral:  Positive for depression (Denies S/HI, denies plan or intent to harm self or others) and substance abuse. Negative for hallucinations, memory loss and suicidal ideas. The patient is nervous/anxious (stable for management outpatient) and has insomnia (stable for management outpatient).   All other systems reviewed and are negative.   Blood pressure 135/88, pulse 72, temperature 98.5 F (36.9 C), temperature source Oral,  resp. rate 20, height 5\' 8"  (1.727 m), weight 82.8 kg, SpO2 100%. Body mass index is 27.76 kg/m.   Mental Status Per Nursing Assessment::   On Admission:  Suicidal ideation indicated by patient Denies SI/HI, denies intent or plan to harm self or others at time of discharge. Demographic Factors:  Male and Low socioeconomic status   Loss Factors: Decrease in vocational status and Financial problems/change in socioeconomic status   Historical Factors: Family history of mental illness or substance abuse   Risk Reduction Factors:   Positive social support   Continued Clinical Symptoms:  Alcohol/Substance Abuse/Dependencies Unstable or Poor Therapeutic Relationship Previous Psychiatric Diagnoses and Treatments   Cognitive Features That Contribute To Risk:  None     Suicide Risk:  Minimal: No identifiable suicidal ideation.  Patients presenting with no risk factors but with morbid ruminations; may be classified as minimal risk based on the severity of the depressive symptoms      Follow-up Information       Services, [facility]. Go on 05/28/2023.   Why: You have been accepted to this treatment facility for substance use. You will begin treatment on 05/28/23 at 9:00AM. You will receive therapy and medication management at this program. Contact information: [address] [place] [address] [phone]              [facility]. Go to.   Specialty: Behavioral Health Why: Please go to this provider for medication management services,  Monday through Friday, arrive by 7:00 am after completing [facility]. Contact information: [address] [place] [address] [phone]            [facility]. Go to.   Specialty: Professional Counselor Why: Please go to this provider for therapy services, Monday through Friday, from 9 am to 1 pm after completing [facility]. Contact information: [address] [place] [address] [phone]
